# Patient Record
Sex: Female | Born: 1985 | Race: Black or African American | Hispanic: No | Marital: Married | Smoking: Never smoker
Health system: Southern US, Community
[De-identification: ages and names within clinical notes are randomized; demographics above are authoritative.]

## PROBLEM LIST (undated history)

## (undated) ENCOUNTER — Inpatient Hospital Stay (HOSPITAL_COMMUNITY): Payer: Self-pay

## (undated) DIAGNOSIS — K219 Gastro-esophageal reflux disease without esophagitis: Secondary | ICD-10-CM

## (undated) DIAGNOSIS — Z9889 Other specified postprocedural states: Secondary | ICD-10-CM

## (undated) DIAGNOSIS — R519 Headache, unspecified: Secondary | ICD-10-CM

## (undated) DIAGNOSIS — E119 Type 2 diabetes mellitus without complications: Secondary | ICD-10-CM

## (undated) DIAGNOSIS — Z87442 Personal history of urinary calculi: Secondary | ICD-10-CM

## (undated) DIAGNOSIS — R7303 Prediabetes: Secondary | ICD-10-CM

## (undated) DIAGNOSIS — R87629 Unspecified abnormal cytological findings in specimens from vagina: Secondary | ICD-10-CM

## (undated) DIAGNOSIS — J302 Other seasonal allergic rhinitis: Secondary | ICD-10-CM

## (undated) DIAGNOSIS — R112 Nausea with vomiting, unspecified: Secondary | ICD-10-CM

## (undated) DIAGNOSIS — I10 Essential (primary) hypertension: Secondary | ICD-10-CM

## (undated) HISTORY — DX: Unspecified abnormal cytological findings in specimens from vagina: R87.629

## (undated) HISTORY — PX: BILATERAL KNEE ARTHROSCOPY: SUR91

## (undated) HISTORY — DX: Prediabetes: R73.03

## (undated) HISTORY — PX: KNEE ARTHROSCOPY: SUR90

---

## 2004-05-02 HISTORY — PX: GYNECOLOGIC CRYOSURGERY: SHX857

## 2004-06-17 ENCOUNTER — Ambulatory Visit: Payer: Self-pay | Admitting: Obstetrics and Gynecology

## 2004-07-08 ENCOUNTER — Ambulatory Visit: Payer: Self-pay | Admitting: Obstetrics and Gynecology

## 2004-07-27 ENCOUNTER — Ambulatory Visit: Payer: Self-pay | Admitting: Obstetrics and Gynecology

## 2004-11-09 ENCOUNTER — Ambulatory Visit: Payer: Self-pay | Admitting: Obstetrics & Gynecology

## 2005-02-02 ENCOUNTER — Inpatient Hospital Stay (HOSPITAL_COMMUNITY): Admission: AD | Admit: 2005-02-02 | Discharge: 2005-02-02 | Payer: Self-pay | Admitting: Family Medicine

## 2005-02-17 ENCOUNTER — Inpatient Hospital Stay (HOSPITAL_COMMUNITY): Admission: AD | Admit: 2005-02-17 | Discharge: 2005-02-18 | Payer: Self-pay | Admitting: Obstetrics & Gynecology

## 2005-02-28 ENCOUNTER — Emergency Department (HOSPITAL_COMMUNITY): Admission: EM | Admit: 2005-02-28 | Discharge: 2005-02-28 | Payer: Self-pay | Admitting: Family Medicine

## 2005-03-15 ENCOUNTER — Ambulatory Visit: Payer: Self-pay | Admitting: Obstetrics & Gynecology

## 2005-03-15 ENCOUNTER — Encounter: Payer: Self-pay | Admitting: Obstetrics & Gynecology

## 2005-03-17 ENCOUNTER — Emergency Department (HOSPITAL_COMMUNITY): Admission: EM | Admit: 2005-03-17 | Discharge: 2005-03-17 | Payer: Self-pay | Admitting: Family Medicine

## 2005-03-23 ENCOUNTER — Inpatient Hospital Stay (HOSPITAL_COMMUNITY): Admission: AD | Admit: 2005-03-23 | Discharge: 2005-03-24 | Payer: Self-pay | Admitting: Obstetrics & Gynecology

## 2005-04-27 ENCOUNTER — Ambulatory Visit (HOSPITAL_COMMUNITY): Admission: RE | Admit: 2005-04-27 | Discharge: 2005-04-27 | Payer: Self-pay | Admitting: Obstetrics & Gynecology

## 2005-05-20 ENCOUNTER — Inpatient Hospital Stay (HOSPITAL_COMMUNITY): Admission: AD | Admit: 2005-05-20 | Discharge: 2005-05-20 | Payer: Self-pay | Admitting: Obstetrics & Gynecology

## 2005-05-21 ENCOUNTER — Inpatient Hospital Stay (HOSPITAL_COMMUNITY): Admission: AD | Admit: 2005-05-21 | Discharge: 2005-05-25 | Payer: Self-pay | Admitting: Obstetrics & Gynecology

## 2005-05-25 ENCOUNTER — Encounter (INDEPENDENT_AMBULATORY_CARE_PROVIDER_SITE_OTHER): Payer: Self-pay | Admitting: Specialist

## 2005-07-19 ENCOUNTER — Ambulatory Visit: Payer: Self-pay | Admitting: Neonatology

## 2005-07-19 ENCOUNTER — Inpatient Hospital Stay (HOSPITAL_COMMUNITY): Admission: AD | Admit: 2005-07-19 | Discharge: 2005-07-25 | Payer: Self-pay | Admitting: Obstetrics & Gynecology

## 2005-09-17 ENCOUNTER — Inpatient Hospital Stay (HOSPITAL_COMMUNITY): Admission: AD | Admit: 2005-09-17 | Discharge: 2005-09-20 | Payer: Self-pay | Admitting: Obstetrics

## 2007-04-24 ENCOUNTER — Emergency Department (HOSPITAL_COMMUNITY): Admission: EM | Admit: 2007-04-24 | Discharge: 2007-04-24 | Payer: Self-pay | Admitting: Emergency Medicine

## 2007-05-11 ENCOUNTER — Inpatient Hospital Stay (HOSPITAL_COMMUNITY): Admission: RE | Admit: 2007-05-11 | Discharge: 2007-05-11 | Payer: Self-pay | Admitting: Gynecology

## 2007-06-13 ENCOUNTER — Inpatient Hospital Stay (HOSPITAL_COMMUNITY): Admission: AD | Admit: 2007-06-13 | Discharge: 2007-06-13 | Payer: Self-pay | Admitting: Obstetrics and Gynecology

## 2007-08-16 ENCOUNTER — Ambulatory Visit (HOSPITAL_COMMUNITY): Admission: RE | Admit: 2007-08-16 | Discharge: 2007-08-16 | Payer: Self-pay | Admitting: Obstetrics & Gynecology

## 2007-08-24 ENCOUNTER — Ambulatory Visit (HOSPITAL_COMMUNITY): Admission: RE | Admit: 2007-08-24 | Discharge: 2007-08-24 | Payer: Self-pay | Admitting: Obstetrics & Gynecology

## 2007-10-03 ENCOUNTER — Ambulatory Visit (HOSPITAL_COMMUNITY): Admission: RE | Admit: 2007-10-03 | Discharge: 2007-10-03 | Payer: Self-pay | Admitting: Obstetrics & Gynecology

## 2007-10-04 ENCOUNTER — Inpatient Hospital Stay (HOSPITAL_COMMUNITY): Admission: AD | Admit: 2007-10-04 | Discharge: 2007-10-04 | Payer: Self-pay | Admitting: Obstetrics

## 2007-10-22 ENCOUNTER — Inpatient Hospital Stay (HOSPITAL_COMMUNITY): Admission: AD | Admit: 2007-10-22 | Discharge: 2007-10-25 | Payer: Self-pay | Admitting: Obstetrics & Gynecology

## 2007-11-25 ENCOUNTER — Inpatient Hospital Stay (HOSPITAL_COMMUNITY): Admission: AD | Admit: 2007-11-25 | Discharge: 2007-11-25 | Payer: Self-pay | Admitting: Obstetrics & Gynecology

## 2007-12-02 ENCOUNTER — Inpatient Hospital Stay (HOSPITAL_COMMUNITY): Admission: AD | Admit: 2007-12-02 | Discharge: 2007-12-04 | Payer: Self-pay | Admitting: Obstetrics

## 2008-06-10 ENCOUNTER — Emergency Department (HOSPITAL_COMMUNITY): Admission: EM | Admit: 2008-06-10 | Discharge: 2008-06-10 | Payer: Self-pay | Admitting: Family Medicine

## 2010-08-17 LAB — POCT RAPID STREP A (OFFICE): Streptococcus, Group A Screen (Direct): NEGATIVE

## 2010-09-14 NOTE — H&P (Signed)
NAME:  Sherri Castaneda, Sherri Castaneda               ACCOUNT NO.:  [id number]   MEDICAL RECORD NO.:  [id number]          PATIENT TYPE:  INP   LOCATION:  9156                          FACILITY:  [facility]   PHYSICIAN:  Roseanna Rainbow, M.D.DATE OF BIRTH:  April 04, 1986   DATE OF ADMISSION:  10/22/2007  DATE OF DISCHARGE:                              HISTORY & PHYSICAL   CHIEF COMPLAINT:  The patient is a 25 year old para 1, with estimated  date of confinement of December 12, 2007, with an intrauterine pregnancy  at 32.5 weeks with a preterm premature cervical changes.   HISTORY OF PRESENT ILLNESS:  Please see the above.  The patient has had  sporadic uterine contractions over the past several weeks.  Workup today  has included a positive fetal fibronectin.  The patient reports  increased pressure and inguinal discomfort.  She denies rupture of  membranes or vaginal bleeding.   ALLERGIES:  LATEX, METRONIDAZOLE.   MEDICATIONS:  Please see the reconciliation form.   PAST OBSTETRICAL HISTORY:  In May 2007, she has delivered a live born  female 5 pounds 12 ounces at 38 weeks vaginal delivery.   PRENATAL LABS:  Blood type is O+, antibody screen negative, Chlamydia  probe negative.  Urine culture and sensitivity October 02, 2007, no growth.  Fetal fibronectin positive on October 11, 2007.  GC probe negative.  Hepatitis B surface antigen negative.  Hematocrit 35.3, hemoglobin 11.4.  HIV nonreactive.  Platelets 222,000.  RPR nonreactive.  Rubella immune.  Sickle cell negative.  Varicella immune.  Ultrasound on October 03, 2007, no  previa, amniotic fluid normal, estimated fetal weight percentile 79th  percentile for 30 weeks 1 day.   PAST GYN HISTORY:  1. Cervical dysplasia.  2. Cryosurgery.   PAST MEDICAL HISTORY:  1. Constipation.  2. Recurrent ear infections.  3. Migraine headaches.  4. Tension headaches.  5. Human papilloma virus.   PAST SURGICAL HISTORY:  Please see the above.   SOCIAL HISTORY:  She is a  Scientist, physiological.  She is engaged, living with the  significant other.  Does not give any significant history of alcohol  usage, has no significant smoking history.  Denies illicit drug use.   FAMILY HISTORY:  Asthma, Hodgkin's lymphoma, liver cancer, breast  cancer, colon cancer, lung cancer, COPD, depression, diabetes mellitus,  hypertension, hypercholesterolemia, kidney stones, and migraine  headaches.   PHYSICAL EXAMINATION:  On physical exam; vital signs stable, afebrile.  Fetal heart rate by Doppler 140.  Sterile vaginal exam, the cervix is  loose 2 cm dilated, 60% effaced, vertex, and -3 station.   ASSESSMENT:  Intrauterine pregnancy at 32 plus weeks, threatened preterm  labor, preterm premature cervical changes.   PLAN:  Admission, bedrest, terbutaline subcutaneous p.r.n. for  tocolysis, betamethasone, repeat complete OB ultrasound for growth,  check urinalysis, urine culture and sensitivity, and clindamycin  parenterally.      Roseanna Rainbow, M.D.  Electronically Signed     LAJ/MEDQ  D:  10/22/2007  T:  10/23/2007  Job:  [id number]

## 2010-09-17 NOTE — Discharge Summary (Signed)
NAME:  Sherri Castaneda, Sherri Castaneda               ACCOUNT NO.:  [id number]   MEDICAL RECORD NO.:  [id number]          PATIENT TYPE:  INP   LOCATION:  9156                          [facility]   PHYSICIAN:  Roseanna Rainbow, M.D.DATE OF BIRTH:  04-06-1986   DATE OF ADMISSION:  07/19/2005  DATE OF DISCHARGE:  07/25/2005                                 DISCHARGE SUMMARY   CHIEF COMPLAINT:  The patient is a 25 year old gravida 1 with an estimated  date of confinement of October 07, 2005, with an intrauterine pregnancy at 28+  weeks, complaining of uterine contractions.  Please see the dictated history  and physical for further details.   HOSPITAL COURSES:  The patient was admitted and started on parenteral broad-  spectrum antibiotics.  She also received a course of steroids and several  doses of subcutaneous terbutaline.  An ultrasound on March 20 demonstrated  an estimated fetal weight percentile of 25th-50th percentile and normal  amniotic fluid in a dynamic cervix that measured 1.4 to 3 cm in length with  funneling at the internal os.  The patient remained without preterm labor.  She remained stable on bedrest.  No further tocolytics were needed after the  initial day.  A repeat ultrasound on March 24 was without change.   DISCHARGE DIAGNOSIS:  Intrauterine pregnancy at 29+ weeks with preterm  premature cervical changes.   CONDITION:  Stable.   DIET:  Regular.   ACTIVITY:  Pelvic rest and bedrest.   MEDICATIONS:  To resume home medications.   DISPOSITION:  The patient was to follow up in the office in 1 week.      Roseanna Rainbow, M.D.  Electronically Signed     LAJ/MEDQ  D:  07/25/2005  T:  07/26/2005  Job:  [id number]

## 2010-09-17 NOTE — H&P (Signed)
NAME:  Sherri Castaneda, Sherri Castaneda               ACCOUNT NO.:  [id number]   MEDICAL RECORD NO.:  [id number]          PATIENT TYPE:  INP   LOCATION:  9156                          [facility]ILITY[facility]   PHYSICIAN:  Roseanna Rainbow, M.D.DATE OF BIRTH:  07-Jan-1986   DATE OF ADMISSION:  07/19/2005  DATE OF DISCHARGE:                                HISTORY & PHYSICAL   CHIEF COMPLAINT:  The patient is a 25 year old gravida 1 with an estimated  date of confinement of October 07, 2005 with an intrauterine pregnancy at 28+  weeks, complaining of uterine contractions.   HISTORY OF PRESENT ILLNESS:  Please see the above.   ALLERGIES:  FLAGYL.   MEDICATIONS:  Prenatal vitamins.   CURRENT MEDICATIONS:  Prenatal vitamins.   PREGNANCY COMPLICATIONS/RISK FACTORS:  Urolithiasis.   LABORATORY DATA:  Blood type is O positive, antibody screen negative.  Hepatitis B surface antigen negative. Hematocrit 36.5, hemoglobin 11.8,  platelets 249,000. HIV nonreactive. Rubella immune Sickle cell negative. RPR  nonreactive. An ultrasound on April 27, 2005 revealed no previa, normal  amniotic fluid, normal anatomic survey.   PAST OB/GYN HISTORY:  She has a history of cervical dysplasia. She has had  cryosurgery.   PAST MEDICAL HISTORY:  Please see the above. She has had GERD, chronic  constipation, ear infections, headaches.   PAST SURGICAL HISTORY:  Please see the above.   SOCIAL HISTORY:  She works in Clinical biochemist at [employer]. She is single, living  with a significant other. Does not give any significant history of alcohol  usage. Has no significant smoking history. Denies illicit drug use.   FAMILY HISTORY:  Alzheimer's, Hodgkin's lymphoma, breast cancer, lung  cancer, liver cancer, CVA, hypercholesterolemia. hypertension, Parkinson's.   PHYSICAL EXAMINATION:  Weight 135.5 pounds. Blood pressure 129/69. Urine dip  negative. Non-stress test baseline 140's with mild variable decelerations,  long term variability  reassuring. Irregular uterine contractions.   STERILE VAGINAL EXAM:  Cervix is mid position, medium to soft consistency,  finger tip, approximately 50% effaced. Informal ultrasound - on vaginal  ultrasound the cervix appears dynamic.   ASSESSMENT:  Primigravida with an intrauterine pregnancy of 20+ weeks. Rule  out threatened preterm labor.   PLAN:  Admission, tocolysis as needed, betamethasone, broad spectrum  parenteral antibiotics and bed rest. Will repeat a complete OB ultrasound.      Roseanna Rainbow, M.D.  Electronically Signed     LAJ/MEDQ  D:  07/19/2005  T:  07/19/2005  Job:  [id number]

## 2010-09-17 NOTE — Discharge Summary (Signed)
NAME:  Sherri Castaneda, Sherri Castaneda               ACCOUNT NO.:  [id number]   MEDICAL RECORD NO.:  [id number]          PATIENT TYPE:  INP   LOCATION:  9310                          [facility]ILITY:  [facility]   PHYSICIAN:  Roseanna Rainbow, M.D.DATE OF BIRTH:  12-Oct-1985   DATE OF ADMISSION:  05/21/2005  DATE OF DISCHARGE:  05/25/2005                                 DISCHARGE SUMMARY   CHIEF COMPLAINT:  The patient is 25 year old, gravida 0, with an estimated  date of confinement of October 07, 2005 complaining of right flank pain.   HISTORY OF PRESENT ILLNESS:  The patient had presented to the office one day  prior with similar complaints and was further evaluated in the maternity  admission unit.   ALLERGIES:  Flagyl.   PHYSICAL EXAMINATION:  VITAL SIGNS:  Stable.  Afebrile.  GENERAL APPEARANCE:  Well-developed female in no distress.  ABDOMEN:  Minimal right lower quadrant pain.  BACK:  No costovertebral angle tenderness.   ASSESSMENT/PLAN:  Rule out nephro or urolithiasis.   PLAN:  Admission.  Bed rest.   HOSPITAL COURSE:  The patient was admitted and supportive care therapies  were administered.  Her pain improved.  She was given a morphine sulfate  patient-controlled analgesic pump.  She did pass a stone on January 22 and  this was sent for analysis.  She was subsequently discharged home on January  24.   DISCHARGE DIAGNOSES:  1.  Intrauterine pregnancy at 20+ weeks.  2.  Urolithiasis.   CONDITION ON DISCHARGE:  Stable.   DIET:  Regular.   ACTIVITY:  Ad lib.   MEDICATIONS:  Percocet.   DISPOSITION:  The patient was to follow up in the office in one week.      Roseanna Rainbow, M.D.  Electronically Signed     LAJ/MEDQ  D:  06/08/2005  T:  06/09/2005  Job:  [id number]

## 2010-09-17 NOTE — Group Therapy Note (Signed)
NAME:  Sherri Castaneda, Sherri Castaneda NO.:  [id number]   MEDICAL RECORD NO.:  [id number]          PATIENT TYPE:  WOC   LOCATION:  [facility]                   FACILITY:  [facility]   PHYSICIAN:  Argentina Donovan, MD        DATE OF BIRTH:  Dec 06, 1985   DATE OF SERVICE:  07/27/2004                                    CLINIC NOTE   CHIEF COMPLAINT:  Vaginal discharge with odor after cryosurgery.   HISTORY OF PRESENT ILLNESS:  This is a 25 year old black female status post  cryosurgery who presents after calling with complaints of a vaginal  discharge and odor. The patient states that the discharge actually stopped  on Friday but she was unable to call the clinic back to cancel her  appointment.  She described a previous discharge as being a watery brownish, sometimes  whitish discharge. She denies a fishy odor, just a foul odor which is  difficult for her to describe. She denies any other complaints. No itching,  burning, or pain.   PHYSICAL EXAMINATION:  The vagina appears clean. The cervix appears within  normal limits. There is scant blood in the vagina as the patient has just  finished her menstrual period. There is no evidence of a discharge. No odor  is noted on exam. The external genitalia appears normal.   ASSESSMENT/PLAN:  A 25 year old status post cryosurgery with resolved  vaginal discharge and odor.  1.  The patient is to call and return to clinic with any further complaints.  2.  The patient is to return for repeat Pap in 4 months.      PR/MEDQ  D:  07/27/2004  T:  07/28/2004  Job:  [id number]

## 2010-09-17 NOTE — Discharge Summary (Signed)
NAME:  Sherri Castaneda, Sherri Castaneda               ACCOUNT NO.:  [id number]   MEDICAL RECORD NO.:  [id number]          PATIENT TYPE:  INP   LOCATION:  9156                          [facility]ILITY[facility]   PHYSICIAN:  Roseanna Rainbow, M.D.DATE OF BIRTH:  10-Nov-1985   DATE OF ADMISSION:  10/22/2007  DATE OF DISCHARGE:  10/25/2007                               DISCHARGE SUMMARY   CHIEF COMPLAINT:  The patient is a 25 year old para 1 with an estimated  date of confinement of December 12, 2007, with an intrauterine pregnancy  at 32 plus weeks, with preterm premature cervical changes.  Please see  the dictated history and physical.   HOSPITAL COURSE:  The patient was admitted.  She was initially started  on Procardia for tocolysis.  However on the Procardia, she complained of  increasing uterine contractions.  The Procardia was discontinued and she  received a course of magnesium sulfate.  She was adequately tocolyzed  with magnesium sulfate.  This was subsequently discontinued.  An  ultrasound on October 24, 2007, demonstrated a closed cervix, but was  dynamic without funneling.  The cervical length was 2.7 cm.  The AFI was  13.  The estimated fetal weight was at the 59th percentile.  On October 25, 2007, the magnesium sulfate was discontinued.  She remained without  uterine contractions after therapy and she was discharged to home.   DISCHARGE DIAGNOSIS:  Threatened preterm labor.   CONDITION:  Stable.   DIET:  Regular.   ACTIVITY:  Modified bed rest, pelvic rest.   DISPOSITION:  The patient was to follow up in the office in 1 week.      Roseanna Rainbow, M.D.  Electronically Signed     LAJ/MEDQ  D:  11/09/2007  T:  11/10/2007  Job:  [id number]

## 2010-12-06 ENCOUNTER — Inpatient Hospital Stay (INDEPENDENT_AMBULATORY_CARE_PROVIDER_SITE_OTHER)
Admission: RE | Admit: 2010-12-06 | Discharge: 2010-12-06 | Disposition: A | Discharge status: Home / Self Care | Payer: Self-pay | Source: Ambulatory Visit | Attending: Family Medicine | Admitting: Family Medicine

## 2010-12-06 DIAGNOSIS — N760 Acute vaginitis: Secondary | ICD-10-CM

## 2010-12-06 LAB — POCT URINALYSIS DIP (DEVICE)
Glucose, UA: NEGATIVE mg/dL
Hgb urine dipstick: NEGATIVE
Leukocytes, UA: NEGATIVE
Nitrite: NEGATIVE
Protein, ur: NEGATIVE mg/dL
Specific Gravity, Urine: 1.02 (ref 1.005–1.030)
Urobilinogen, UA: 0.2 mg/dL (ref 0.0–1.0)
pH: 7.5 (ref 5.0–8.0)

## 2010-12-06 LAB — WET PREP, GENITAL
Trich, Wet Prep: NONE SEEN
Yeast Wet Prep HPF POC: NONE SEEN

## 2010-12-06 LAB — POCT PREGNANCY, URINE: Preg Test, Ur: NEGATIVE

## 2010-12-06 LAB — OB RESULTS CONSOLE GC/CHLAMYDIA: Chlamydia: NEGATIVE

## 2010-12-07 LAB — GC/CHLAMYDIA PROBE AMP, GENITAL: Chlamydia, DNA Probe: NEGATIVE

## 2011-01-20 LAB — URINALYSIS, ROUTINE W REFLEX MICROSCOPIC
Ketones, ur: NEGATIVE
Nitrite: NEGATIVE
Protein, ur: NEGATIVE

## 2011-01-20 LAB — WET PREP, GENITAL
Clue Cells Wet Prep HPF POC: NONE SEEN
Trich, Wet Prep: NONE SEEN

## 2011-01-21 LAB — URINALYSIS, ROUTINE W REFLEX MICROSCOPIC
Protein, ur: NEGATIVE
Urobilinogen, UA: 0.2

## 2011-01-27 LAB — URINALYSIS, ROUTINE W REFLEX MICROSCOPIC
Bilirubin Urine: NEGATIVE
Glucose, UA: NEGATIVE
Hgb urine dipstick: NEGATIVE
Ketones, ur: NEGATIVE
Ketones, ur: NEGATIVE
Nitrite: NEGATIVE
Nitrite: NEGATIVE
Protein, ur: NEGATIVE
pH: 7.5

## 2011-01-27 LAB — SAMPLE TO BLOOD BANK

## 2011-01-27 LAB — URINE CULTURE: Colony Count: NO GROWTH

## 2011-01-27 LAB — CBC
MCV: 79.2
RBC: 3.95
WBC: 10.5

## 2011-01-27 LAB — RPR: RPR Ser Ql: NONREACTIVE

## 2011-01-28 LAB — RPR: RPR Ser Ql: NONREACTIVE

## 2011-01-28 LAB — CBC
HCT: 32.1 — ABNORMAL LOW
Hemoglobin: 10.6 — ABNORMAL LOW
MCV: 77.9 — ABNORMAL LOW
RBC: 4.12
RBC: 4.46
WBC: 10.6 — ABNORMAL HIGH
WBC: 13.2 — ABNORMAL HIGH

## 2011-02-04 LAB — COMPREHENSIVE METABOLIC PANEL
AST: 16
BUN: 9
CO2: 23
Calcium: 9.7
Chloride: 101
Creatinine, Ser: 0.69
GFR calc Af Amer: >60
GFR calc non Af Amer: >60
Total Bilirubin: 0.5

## 2011-02-04 LAB — URINALYSIS, ROUTINE W REFLEX MICROSCOPIC
Glucose, UA: NEGATIVE
Hgb urine dipstick: NEGATIVE
Leukocytes, UA: NEGATIVE
Specific Gravity, Urine: 1.035 — ABNORMAL HIGH

## 2011-02-04 LAB — DIFFERENTIAL
Basophils Absolute: 0
Eosinophils Relative: 0
Lymphocytes Relative: 5 — ABNORMAL LOW
Lymphs Abs: 0.7
Neutro Abs: 12.2 — ABNORMAL HIGH

## 2011-02-04 LAB — URINE MICROSCOPIC-ADD ON

## 2011-02-04 LAB — CBC
HCT: 40.6
MCHC: 33.8
MCV: 76.2 — ABNORMAL LOW
Platelets: 328
RBC: 5.33 — ABNORMAL HIGH

## 2011-02-04 LAB — LIPASE, BLOOD: Lipase: 20

## 2011-05-03 NOTE — L&D Delivery Note (Signed)
Delivery Note At 12:22 PM a viable female was delivered via  (Presentation: ROA ).     Placenta status: delivered w/cord traction/intact/3VC/meconium-stained .    Anesthesia:  Epidural Episiotomy: none Lacerations: none Suture Repair: n/a Est. Blood Loss (mL): 200 ml  Mom to postpartum.  Baby to nursery-stable.  JACKSON-MOORE,Giang Hemme A 03/28/2012, 12:29 PM

## 2011-09-06 ENCOUNTER — Inpatient Hospital Stay (HOSPITAL_COMMUNITY)
Admission: AD | Admit: 2011-09-06 | Discharge: 2011-09-06 | Disposition: A | Discharge status: Home / Self Care | Payer: Medicaid Other | Source: Ambulatory Visit | Attending: Obstetrics & Gynecology | Admitting: Obstetrics & Gynecology

## 2011-09-06 ENCOUNTER — Encounter (HOSPITAL_COMMUNITY): Payer: Self-pay | Admitting: *Deleted

## 2011-09-06 DIAGNOSIS — O99891 Other specified diseases and conditions complicating pregnancy: Secondary | ICD-10-CM | POA: Insufficient documentation

## 2011-09-06 DIAGNOSIS — J309 Allergic rhinitis, unspecified: Secondary | ICD-10-CM | POA: Insufficient documentation

## 2011-09-06 DIAGNOSIS — J302 Other seasonal allergic rhinitis: Secondary | ICD-10-CM

## 2011-09-06 DIAGNOSIS — J069 Acute upper respiratory infection, unspecified: Secondary | ICD-10-CM | POA: Insufficient documentation

## 2011-09-06 HISTORY — DX: Other seasonal allergic rhinitis: J30.2

## 2011-09-06 MED ORDER — CETIRIZINE HCL 10 MG PO CAPS: 1.0000 | ORAL_CAPSULE | Freq: Every day | ORAL | Status: DC

## 2011-09-06 MED ORDER — ONDANSETRON HCL 4 MG PO TABS: 4.0000 mg | ORAL_TABLET | Freq: Three times a day (TID) | ORAL | Status: AC | PRN

## 2011-09-06 MED ORDER — DEXTROMETHORPHAN POLISTIREX 30 MG/5ML PO LQCR: 60.0000 mg | ORAL | Status: AC | PRN

## 2011-09-06 MED ORDER — GUAIFENESIN ER 600 MG PO TB12: 1200.0000 mg | ORAL_TABLET | Freq: Two times a day (BID) | ORAL | Status: DC

## 2011-09-06 NOTE — Discharge Instructions (Signed)
Allergic Rhinitis Allergic rhinitis is when the mucous membranes in the nose respond to allergens. Allergens are particles in the air that cause your body to have an allergic reaction. This causes you to release allergic antibodies. Through a chain of events, these eventually cause you to release histamine into the blood stream (hence the use of antihistamines). Although meant to be protective to the body, it is this release that causes your discomfort, such as frequent sneezing, congestion and an itchy runny nose.  CAUSES  The pollen allergens may come from grasses, trees, and weeds. This is seasonal allergic rhinitis, or "hay fever." Other allergens cause year-round allergic rhinitis (perennial allergic rhinitis) such as house dust mite allergen, pet dander and mold spores.  SYMPTOMS   Nasal stuffiness (congestion).   Runny, itchy nose with sneezing and tearing of the eyes.   There is often an itching of the mouth, eyes and ears.  It cannot be cured, but it can be controlled with medications. DIAGNOSIS  If you are unable to determine the offending allergen, skin or blood testing may find it. TREATMENT   Avoid the allergen.   Medications and allergy shots (immunotherapy) can help.   Hay fever may often be treated with antihistamines in pill or nasal spray forms. Antihistamines block the effects of histamine. There are over-the-counter medicines that may help with nasal congestion and swelling around the eyes. Check with your caregiver before taking or giving this medicine.  If the treatment above does not work, there are many new medications your caregiver can prescribe. Stronger medications may be used if initial measures are ineffective. Desensitizing injections can be used if medications and avoidance fails. Desensitization is when a patient is given ongoing shots until the body becomes less sensitive to the allergen. Make sure you follow up with your caregiver if problems continue. SEEK  MEDICAL CARE IF:   You develop fever (more than 100.5 F (38.1 C).   You develop a cough that does not stop easily (persistent).   You have shortness of breath.   You start wheezing.   Symptoms interfere with normal daily activities.  Document Released: 01/11/2001 Document Revised: 04/07/2011 Document Reviewed: 07/23/2008 Exit[employer] Patient Information 2012 [employer]  Upper Respiratory Infection, Adult An upper respiratory infection (URI) is also known as the common cold. It is often caused by a type of germ (virus). Colds are easily spread (contagious). You can pass it to others by kissing, coughing, sneezing, or drinking out of the same glass. Usually, you get better in 1 or 2 weeks.  HOME CARE   Only take medicine as told by your doctor.   Use a warm mist humidifier or breathe in steam from a hot shower.   Drink enough water and fluids to keep your pee (urine) clear or pale yellow.   Get plenty of rest.   Return to work when your temperature is back to normal or as told by your doctor. You may use a face mask and wash your hands to stop your cold from spreading.  GET HELP RIGHT AWAY IF:   After the first few days, you feel you are getting worse.   You have questions about your medicine.   You have chills, shortness of breath, or brown or red spit (mucus).   You have yellow or brown snot (nasal discharge) or pain in the face, especially when you bend forward.   You have a fever, puffy (swollen) neck, pain when you swallow, or white spots in the   back of your throat.   You have a bad headache, ear pain, sinus pain, or chest pain.   You have a high-pitched whistling sound when you breathe in and out (wheezing).   You have a lasting cough or cough up blood.   You have sore muscles or a stiff neck.  MAKE SURE YOU:   Understand these instructions.   Will watch your condition.   Will get help right away if you are not doing well or get worse.  Document Released:  10/05/2007 Document Revised: 04/07/2011 Document Reviewed: 08/23/2010 Exit[employer] Patient Information 2012 [employer]. 

## 2011-09-06 NOTE — MAU Note (Signed)
Throat was hurting yesterday morning.  Yesterday when got home from  Work.  Head was hurting,  Kept going hot then cold.  Felt feverishl. Blew nose this morning - green mucous, ears were hurting.  Couldn't wear glasses.  Everything from the throat/neck up hurts.  No appetite today, just drinking water to stay hydrated. Also pain in LLQ started yesterday

## 2011-09-06 NOTE — MAU Provider Note (Signed)
Chief Complaint:  Facial Pain    First Provider Initiated Contact with Patient 09/06/11 0900      Sherri Castaneda is  26 y.o. [id number].  No LMP recorded. Patient is pregnant..  [redacted]w[redacted]d  She presents complaining of Facial Pain . Onset is described as ongoing and has been present for  2-3 days. Describes sinus pressure, non productive cough, sore throat and right ear pain. Denies fever, abd pain, bleeding or LOF. Occasional chills, episodic vomiting and lower abd cramping after coughing  Obstetrical/Gynecological History: OB History    Grav Para Term Preterm Abortions TAB SAB Ect Mult Living   3 2 2       2       Past Medical History: Past Medical History  Diagnosis Date  . Seasonal allergies     Past Surgical History: Past Surgical History  Procedure Date  . Gynecologic cryosurgery     Family History: History reviewed. No pertinent family history.  Social History: History  Substance Use Topics  . Smoking status: Never Smoker   . Smokeless tobacco: Not on file  . Alcohol Use: No    Allergies: Allergies not on file  No prescriptions prior to admission    Review of Systems - ENT ROS: positive for - headaches, nasal congestion, nasal discharge, sinus pain and sore throat Allergy and Immunology ROS: positive for - itchy/watery eyes, nasal congestion, postnasal drip and seasonal allergies Respiratory ROS: positive for - cough Cardiovascular ROS: no chest pain or dyspnea on exertion Gastrointestinal ROS: no abdominal pain, change in bowel habits, or black or bloody stools  Physical Exam   Blood pressure 116/76, pulse 112, temperature 98.8 F (37.1 C), temperature source Oral, resp. rate 20, height 5' 1.5" (1.562 m), weight 136 lb (61.689 kg), SpO2 100.00%.  General: General appearance - alert, well appearing, and in no distress and oriented to person, place, and time Mental status - alert, oriented to person, place, and time, normal mood, behavior, speech, dress, motor  activity, and thought processes, affect appropriate to mood Eyes - allergy eyes Ears - bilateral TM's and external ear canals normal Nose - mucosal congestion, mucosal erythema, clear rhinorrhea and sinuses normal and nontender Mouth - mucous membranes moist, pharynx normal without lesions Neck - supple, no significant adenopathy Lymphatics - no palpable lymphadenopathy Chest - clear to auscultation, no wheezes, rales or rhonchi, symmetric air entry Heart - normal rate, regular rhythm, normal S1, S2, no murmurs, rubs, clicks or gallops Abdomen - soft, nontender, nondistended, no masses or organomegaly Focused Gynecological Exam: examination not indicated, FHTs dopplered at 150s  Assessment: 1. URI (upper respiratory infection)   2. Allergic rhinitis, seasonal     Plan: Discharge home OTC med given FU with [facility] on Thursday as scheduled  Sherri Castaneda E. 09/06/2011,9:20 AM

## 2011-09-08 LAB — OB RESULTS CONSOLE RUBELLA ANTIBODY, IGM: Rubella: IMMUNE

## 2011-09-08 LAB — OB RESULTS CONSOLE ANTIBODY SCREEN: Antibody Screen: NEGATIVE

## 2011-09-08 LAB — OB RESULTS CONSOLE ABO/RH

## 2011-09-09 ENCOUNTER — Other Ambulatory Visit: Payer: Self-pay | Admitting: Nurse Practitioner

## 2011-09-09 DIAGNOSIS — O3680X0 Pregnancy with inconclusive fetal viability, not applicable or unspecified: Secondary | ICD-10-CM

## 2011-09-12 ENCOUNTER — Ambulatory Visit (HOSPITAL_COMMUNITY)
Admission: RE | Admit: 2011-09-12 | Discharge: 2011-09-12 | Disposition: A | Discharge status: Home / Self Care | Payer: Medicaid Other | Source: Ambulatory Visit | Attending: Nurse Practitioner | Admitting: Nurse Practitioner

## 2011-09-12 DIAGNOSIS — R109 Unspecified abdominal pain: Secondary | ICD-10-CM | POA: Insufficient documentation

## 2011-09-12 DIAGNOSIS — O3680X0 Pregnancy with inconclusive fetal viability, not applicable or unspecified: Secondary | ICD-10-CM

## 2011-09-12 DIAGNOSIS — O99891 Other specified diseases and conditions complicating pregnancy: Secondary | ICD-10-CM | POA: Insufficient documentation

## 2011-09-12 DIAGNOSIS — Z3689 Encounter for other specified antenatal screening: Secondary | ICD-10-CM | POA: Insufficient documentation

## 2011-10-20 ENCOUNTER — Other Ambulatory Visit: Payer: Self-pay | Admitting: Physician Assistant

## 2011-12-06 LAB — OB RESULTS CONSOLE GC/CHLAMYDIA: Chlamydia: NEGATIVE

## 2011-12-21 LAB — OB RESULTS CONSOLE RPR: RPR: NONREACTIVE

## 2012-03-04 ENCOUNTER — Inpatient Hospital Stay (HOSPITAL_COMMUNITY)
Admission: AD | Admit: 2012-03-04 | Discharge: 2012-03-04 | Disposition: A | Discharge status: Home / Self Care | Payer: Medicaid Other | Source: Ambulatory Visit | Attending: Obstetrics & Gynecology | Admitting: Obstetrics & Gynecology

## 2012-03-04 ENCOUNTER — Encounter (HOSPITAL_COMMUNITY): Payer: Self-pay | Admitting: Obstetrics and Gynecology

## 2012-03-04 DIAGNOSIS — O4700 False labor before 37 completed weeks of gestation, unspecified trimester: Secondary | ICD-10-CM | POA: Insufficient documentation

## 2012-03-04 NOTE — MAU Note (Signed)
Pt presents to MAU with chief complaint of contractions. Pt says the contractions got strong this morning around 1030. Pt is a G3P2 at [redacted]w[redacted]d.

## 2012-03-04 NOTE — Progress Notes (Signed)
Notified Dr. Gaynell Face of cervical exam, contraction pattern, variables lasting 20-30 secs. Pt resting through contractions. Orders received to send patient home with labor precautions.

## 2012-03-05 LAB — OB RESULTS CONSOLE GBS: GBS: POSITIVE

## 2012-03-13 ENCOUNTER — Inpatient Hospital Stay (HOSPITAL_COMMUNITY)
Admission: AD | Admit: 2012-03-13 | Discharge: 2012-03-13 | Disposition: A | Discharge status: Home / Self Care | Payer: Medicaid Other | Source: Ambulatory Visit | Attending: Obstetrics | Admitting: Obstetrics

## 2012-03-13 DIAGNOSIS — O99891 Other specified diseases and conditions complicating pregnancy: Secondary | ICD-10-CM | POA: Insufficient documentation

## 2012-03-13 NOTE — MAU Note (Signed)
C/o leaking fluid from her vagina for 6 days

## 2012-03-18 ENCOUNTER — Inpatient Hospital Stay (HOSPITAL_COMMUNITY)
Admission: AD | Admit: 2012-03-18 | Discharge: 2012-03-18 | Disposition: A | Discharge status: Home / Self Care | Payer: Medicaid Other | Source: Ambulatory Visit | Attending: Obstetrics & Gynecology | Admitting: Obstetrics & Gynecology

## 2012-03-18 ENCOUNTER — Encounter (HOSPITAL_COMMUNITY): Payer: Self-pay | Admitting: Obstetrics and Gynecology

## 2012-03-18 DIAGNOSIS — O479 False labor, unspecified: Secondary | ICD-10-CM | POA: Insufficient documentation

## 2012-03-18 DIAGNOSIS — O368190 Decreased fetal movements, unspecified trimester, not applicable or unspecified: Secondary | ICD-10-CM | POA: Insufficient documentation

## 2012-03-18 MED ORDER — CYCLOBENZAPRINE HCL 10 MG PO TABS
10.0000 mg | ORAL_TABLET | Freq: Once | ORAL | Status: AC
  Administered 2012-03-18: 10 mg via ORAL
  Filled 2012-03-18: qty 1

## 2012-03-18 NOTE — MAU Note (Signed)
Pt reports having ctx since last night. Pain worse the last 2 hours. Reports leaking a small amount of clear fluid. Good fetal movement

## 2012-03-18 NOTE — MAU Note (Signed)
Patient requests flexeril. Dr. Clearance Coots called and orders received.

## 2012-03-18 NOTE — MAU Provider Note (Signed)
  History     CSN: [id number]  Arrival date and time: 03/18/12 1418   First Provider Initiated Contact with Patient 03/18/12 1446      Chief Complaint  Patient presents with  . Labor Eval   HPI Sherri Castaneda is a 26 y.o. [id number] at 101w2d. She presents with contractions since last evening and ? leaking fluid. Decreased fetal movement this am, great movement since arrived at hospital. No bleeding. Is just tired of hurting.      Past Medical History  Diagnosis Date  . Seasonal allergies     Past Surgical History  Procedure Date  . Gynecologic cryosurgery     History reviewed. No pertinent family history.  History  Substance Use Topics  . Smoking status: Never Smoker   . Smokeless tobacco: Not on file  . Alcohol Use: No    Allergies:  Allergies  Allergen Reactions  . Flagyl (Metronidazole) Swelling  . Pineapple Itching and Swelling  . Latex Itching and Rash    Prescriptions prior to admission  Medication Sig Dispense Refill  . pantoprazole (PROTONIX) 40 MG tablet Take 40 mg by mouth daily.      . Prenatal Vit-Fe Fumarate-FA (PRENATAL MULTIVITAMIN) TABS Take 1 tablet by mouth daily.        Review of Systems  Constitutional: Negative for fever and chills.  Gastrointestinal: Negative for nausea, vomiting, diarrhea and constipation.  Genitourinary: Positive for dysuria. Negative for urgency and frequency.  Musculoskeletal: Positive for back pain.   Physical Exam   Blood pressure 118/71, pulse 118, temperature 97.8 F (36.6 C), temperature source Oral, resp. rate 20.  Physical Exam  Constitutional: She is oriented to person, place, and time. She appears well-developed and well-nourished.  GI: Soft.  Genitourinary:       Pelvic: Vulva- nl anatomy, skin intact Vagina- mod amt thick creamy discharge Cx- 1 cm, 60 % per RN exam  Musculoskeletal: Normal range of motion.  Neurological: She is alert and oriented to person, place, and time.  Skin: Skin is warm  and dry.  Psychiatric: She has a normal mood and affect. Her behavior is normal.    MAU Course  Procedures Fern-Negative   Assessment and Plan  Fern negative, reactive strip RN to assess labor status  Montel Vanderhoof M. 03/18/2012, 2:58 PM

## 2012-03-18 NOTE — Progress Notes (Signed)
Dr. Clearance Coots notified of cervical exam- rule out rupture done by NP. Cervix is 1, 50, -3. Contractions irregular. Orders received for patient to be discharged home.

## 2012-03-28 ENCOUNTER — Inpatient Hospital Stay (HOSPITAL_COMMUNITY): Payer: Medicaid Other | Admitting: Anesthesiology

## 2012-03-28 ENCOUNTER — Encounter (HOSPITAL_COMMUNITY): Payer: Self-pay | Admitting: Anesthesiology

## 2012-03-28 ENCOUNTER — Encounter (HOSPITAL_COMMUNITY): Payer: Self-pay | Admitting: *Deleted

## 2012-03-28 ENCOUNTER — Inpatient Hospital Stay (HOSPITAL_COMMUNITY)
Admission: AD | Admit: 2012-03-28 | Discharge: 2012-03-30 | DRG: 775 | Disposition: A | Discharge status: Home / Self Care | Payer: Medicaid Other | Source: Ambulatory Visit | Attending: Obstetrics | Admitting: Obstetrics

## 2012-03-28 DIAGNOSIS — Z22330 Carrier of Group B streptococcus: Secondary | ICD-10-CM

## 2012-03-28 DIAGNOSIS — O99892 Other specified diseases and conditions complicating childbirth: Principal | ICD-10-CM | POA: Diagnosis present

## 2012-03-28 LAB — CBC
HCT: 34.2 % — ABNORMAL LOW (ref 36.0–46.0)
Hemoglobin: 10.7 g/dL — ABNORMAL LOW (ref 12.0–15.0)
MCHC: 31.3 g/dL (ref 30.0–36.0)
RDW: 15.3 % (ref 11.5–15.5)
WBC: 11 10*3/uL — ABNORMAL HIGH (ref 4.0–10.5)

## 2012-03-28 LAB — POCT FERN TEST: POCT Fern Test: NEGATIVE

## 2012-03-28 LAB — RPR: RPR Ser Ql: NONREACTIVE

## 2012-03-28 MED ORDER — LACTATED RINGERS IV SOLN
500.0000 mL | INTRAVENOUS | Status: DC | PRN
  Administered 2012-03-28 (×2): 500 mL via INTRAVENOUS

## 2012-03-28 MED ORDER — FERROUS SULFATE 325 (65 FE) MG PO TABS
325.0000 mg | ORAL_TABLET | Freq: Two times a day (BID) | ORAL | Status: DC
  Administered 2012-03-28 – 2012-03-30 (×4): 325 mg via ORAL
  Filled 2012-03-28 (×4): qty 1

## 2012-03-28 MED ORDER — ACETAMINOPHEN 325 MG PO TABS: 650.0000 mg | ORAL_TABLET | ORAL | Status: DC | PRN

## 2012-03-28 MED ORDER — PROMETHAZINE HCL 25 MG/ML IJ SOLN
12.5000 mg | Freq: Once | INTRAMUSCULAR | Status: AC
  Administered 2012-03-28: 12.5 mg via INTRAVENOUS
  Filled 2012-03-28: qty 1

## 2012-03-28 MED ORDER — DIPHENHYDRAMINE HCL 50 MG/ML IJ SOLN: 12.5000 mg | INTRAMUSCULAR | Status: DC | PRN

## 2012-03-28 MED ORDER — FENTANYL 2.5 MCG/ML BUPIVACAINE 1/10 % EPIDURAL INFUSION (WH - ANES)
14.0000 mL/h | INTRAMUSCULAR | Status: DC
Start: 2012-03-28 — End: 2012-03-28
  Filled 2012-03-28: qty 125

## 2012-03-28 MED ORDER — PRENATAL MULTIVITAMIN CH
1.0000 | ORAL_TABLET | Freq: Every day | ORAL | Status: DC
  Administered 2012-03-29 – 2012-03-30 (×2): 1 via ORAL
  Filled 2012-03-28 (×2): qty 1

## 2012-03-28 MED ORDER — DIBUCAINE 1 % RE OINT: 1.0000 "application " | TOPICAL_OINTMENT | RECTAL | Status: DC | PRN

## 2012-03-28 MED ORDER — BENZOCAINE-MENTHOL 20-0.5 % EX AERO
1.0000 "application " | INHALATION_SPRAY | CUTANEOUS | Status: DC | PRN
  Administered 2012-03-28: 1 via TOPICAL
  Filled 2012-03-28: qty 56

## 2012-03-28 MED ORDER — LANOLIN HYDROUS EX OINT: TOPICAL_OINTMENT | CUTANEOUS | Status: DC | PRN

## 2012-03-28 MED ORDER — PHENYLEPHRINE 40 MCG/ML (10ML) SYRINGE FOR IV PUSH (FOR BLOOD PRESSURE SUPPORT)
80.0000 ug | PREFILLED_SYRINGE | INTRAVENOUS | Status: DC | PRN
  Filled 2012-03-28: qty 2

## 2012-03-28 MED ORDER — OXYTOCIN 40 UNITS IN LACTATED RINGERS INFUSION - SIMPLE MED
62.5000 mL/h | INTRAVENOUS | Status: DC
  Filled 2012-03-28: qty 1000

## 2012-03-28 MED ORDER — OXYCODONE-ACETAMINOPHEN 5-325 MG PO TABS
1.0000 | ORAL_TABLET | ORAL | Status: DC | PRN
  Filled 2012-03-28: qty 1

## 2012-03-28 MED ORDER — WITCH HAZEL-GLYCERIN EX PADS: 1.0000 "application " | MEDICATED_PAD | CUTANEOUS | Status: DC | PRN

## 2012-03-28 MED ORDER — LACTATED RINGERS IV SOLN: 500.0000 mL | Freq: Once | INTRAVENOUS | Status: DC

## 2012-03-28 MED ORDER — DIPHENHYDRAMINE HCL 25 MG PO CAPS: 25.0000 mg | ORAL_CAPSULE | Freq: Four times a day (QID) | ORAL | Status: DC | PRN

## 2012-03-28 MED ORDER — LACTATED RINGERS IV SOLN
INTRAVENOUS | Status: DC
  Administered 2012-03-28 (×2): via INTRAVENOUS

## 2012-03-28 MED ORDER — ONDANSETRON HCL 4 MG PO TABS: 4.0000 mg | ORAL_TABLET | ORAL | Status: DC | PRN

## 2012-03-28 MED ORDER — EPHEDRINE 5 MG/ML INJ
10.0000 mg | INTRAVENOUS | Status: DC | PRN
  Filled 2012-03-28: qty 4
  Filled 2012-03-28: qty 2

## 2012-03-28 MED ORDER — ONDANSETRON HCL 4 MG/2ML IJ SOLN: 4.0000 mg | INTRAMUSCULAR | Status: DC | PRN

## 2012-03-28 MED ORDER — ZOLPIDEM TARTRATE 5 MG PO TABS: 5.0000 mg | ORAL_TABLET | Freq: Every evening | ORAL | Status: DC | PRN

## 2012-03-28 MED ORDER — OXYTOCIN BOLUS FROM INFUSION: 500.0000 mL | INTRAVENOUS | Status: DC

## 2012-03-28 MED ORDER — BUTORPHANOL TARTRATE 2 MG/ML IJ SOLN
2.0000 mg | Freq: Once | INTRAMUSCULAR | Status: DC
  Filled 2012-03-28: qty 2

## 2012-03-28 MED ORDER — IBUPROFEN 600 MG PO TABS: 600.0000 mg | ORAL_TABLET | Freq: Four times a day (QID) | ORAL | Status: DC | PRN

## 2012-03-28 MED ORDER — SENNOSIDES-DOCUSATE SODIUM 8.6-50 MG PO TABS
2.0000 | ORAL_TABLET | Freq: Every day | ORAL | Status: DC
  Administered 2012-03-28 – 2012-03-29 (×2): 2 via ORAL

## 2012-03-28 MED ORDER — ONDANSETRON HCL 4 MG/2ML IJ SOLN: 4.0000 mg | Freq: Four times a day (QID) | INTRAMUSCULAR | Status: DC | PRN

## 2012-03-28 MED ORDER — MEASLES, MUMPS & RUBELLA VAC ~~LOC~~ INJ
0.5000 mL | INJECTION | Freq: Once | SUBCUTANEOUS | Status: DC
  Filled 2012-03-28: qty 0.5

## 2012-03-28 MED ORDER — EPHEDRINE 5 MG/ML INJ
10.0000 mg | INTRAVENOUS | Status: DC | PRN
  Filled 2012-03-28: qty 2

## 2012-03-28 MED ORDER — PHENYLEPHRINE 40 MCG/ML (10ML) SYRINGE FOR IV PUSH (FOR BLOOD PRESSURE SUPPORT)
80.0000 ug | PREFILLED_SYRINGE | INTRAVENOUS | Status: DC | PRN
  Filled 2012-03-28: qty 2
  Filled 2012-03-28: qty 5

## 2012-03-28 MED ORDER — BUTORPHANOL TARTRATE 1 MG/ML IJ SOLN
2.0000 mg | Freq: Once | INTRAMUSCULAR | Status: AC
  Administered 2012-03-28: 2 mg via INTRAVENOUS

## 2012-03-28 MED ORDER — MAGNESIUM HYDROXIDE 400 MG/5ML PO SUSP: 30.0000 mL | ORAL | Status: DC | PRN

## 2012-03-28 MED ORDER — SODIUM CHLORIDE 0.9 % IV SOLN
2.0000 g | Freq: Once | INTRAVENOUS | Status: AC
  Administered 2012-03-28: 2 g via INTRAVENOUS
  Filled 2012-03-28: qty 2000

## 2012-03-28 MED ORDER — LIDOCAINE HCL (PF) 1 % IJ SOLN
INTRAMUSCULAR | Status: DC | PRN
  Administered 2012-03-28 (×2): 4 mL

## 2012-03-28 MED ORDER — CITRIC ACID-SODIUM CITRATE 334-500 MG/5ML PO SOLN: 30.0000 mL | ORAL | Status: DC | PRN

## 2012-03-28 MED ORDER — FENTANYL 2.5 MCG/ML BUPIVACAINE 1/10 % EPIDURAL INFUSION (WH - ANES)
INTRAMUSCULAR | Status: DC | PRN
  Administered 2012-03-28: 14 mL/h via EPIDURAL

## 2012-03-28 MED ORDER — IBUPROFEN 600 MG PO TABS
600.0000 mg | ORAL_TABLET | Freq: Four times a day (QID) | ORAL | Status: DC
  Administered 2012-03-28 – 2012-03-30 (×8): 600 mg via ORAL
  Filled 2012-03-28 (×8): qty 1

## 2012-03-28 MED ORDER — TETANUS-DIPHTH-ACELL PERTUSSIS 5-2.5-18.5 LF-MCG/0.5 IM SUSP
0.5000 mL | Freq: Once | INTRAMUSCULAR | Status: AC
  Administered 2012-03-30: 0.5 mL via INTRAMUSCULAR
  Filled 2012-03-28: qty 0.5

## 2012-03-28 MED ORDER — OXYCODONE-ACETAMINOPHEN 5-325 MG PO TABS: 1.0000 | ORAL_TABLET | ORAL | Status: DC | PRN

## 2012-03-28 MED ORDER — LIDOCAINE HCL (PF) 1 % IJ SOLN
30.0000 mL | INTRAMUSCULAR | Status: DC | PRN
  Filled 2012-03-28 (×2): qty 30

## 2012-03-28 NOTE — Anesthesia Preprocedure Evaluation (Signed)

## 2012-03-28 NOTE — MAU Note (Signed)
contractions 

## 2012-03-28 NOTE — H&P (Signed)
Sherri Castaneda is a 26 y.o. female presenting for UC's. Maternal Medical History:  Reason for admission: Reason for admission: contractions.  26 yo G3 P0200.  EDC 03-26-12.  Presents with UC's.   Contractions: Onset was 3-5 hours ago.   Frequency: regular.    Fetal activity: Perceived fetal activity is normal.   Last perceived fetal movement was within the past hour.    Prenatal complications: no prenatal complications Prenatal Complications - Diabetes: none.    OB History    Grav Para Term Preterm Abortions TAB SAB Ect Mult Living   3 2  2      2      Past Medical History  Diagnosis Date  . Seasonal allergies    Past Surgical History  Procedure Date  . Gynecologic cryosurgery    Family History: family history is not on file. Social History:  reports that she has never smoked. She does not have any smokeless tobacco history on file. She reports that she does not drink alcohol or use illicit drugs.   Prenatal Transfer Tool  Maternal Diabetes: No Genetic Screening: Normal Maternal Ultrasounds/Referrals: Normal Fetal Ultrasounds or other Referrals:  None Maternal Substance Abuse:  No Significant Maternal Medications:  Meds include: Other: PNV Significant Maternal Lab Results:  Lab values include: Group B Strep positive Other Comments:  None  Review of Systems  All other systems reviewed and are negative.    Dilation: 3 Effacement (%): 80 Station: -1 Exam by:: Weston,RN Temperature 98.6 F (37 C), temperature source Oral, resp. rate 22, height 5' (1.524 m), weight 170 lb (77.111 kg). Maternal Exam:  Uterine Assessment: Contraction strength is moderate.  Abdomen: Patient reports no abdominal tenderness. Fetal presentation: vertex  Pelvis: adequate for delivery.   Cervix: Cervix evaluated by digital exam.     Physical Exam  Constitutional: She is oriented to person, place, and time. She appears well-developed and well-nourished.  HENT:  Head: Normocephalic and  atraumatic.  Eyes: Conjunctivae normal are normal. Pupils are equal, round, and reactive to light.  Neck: Normal range of motion. Neck supple.  Cardiovascular: Normal rate.   Respiratory: Effort normal.  GI: Soft.  Genitourinary: Vagina normal and uterus normal.  Musculoskeletal: Normal range of motion.  Neurological: She is alert and oriented to person, place, and time.  Skin: Skin is warm and dry.  Psychiatric: She has a normal mood and affect. Her behavior is normal. Judgment and thought content normal.    Prenatal labs: ABO, Rh: O/--/-- (05/09 0000) Antibody: Negative (05/09 0000) Rubella: Immune (05/09 0000) RPR:    HBsAg: Negative (05/09 0000)  HIV: Non-reactive (05/09 0000)  GBS: Positive (11/04 0000)   Assessment/Plan: 39 weeks.  Early labor.  Expectant.   Batina Dougan A 03/28/2012, 7:48 AM

## 2012-03-28 NOTE — Anesthesia Postprocedure Evaluation (Signed)
  Anesthesia Post-op Note  Patient: Sherri Castaneda  Procedure(s) Performed: * No procedures listed *  Patient Location: Mother/Baby  Anesthesia Type:Spinal  Level of Consciousness: awake, alert  and oriented  Airway and Oxygen Therapy: Patient Spontanous Breathing  Post-op Pain: none  Post-op Assessment: Post-op Vital signs reviewed and Patient's Cardiovascular Status Stable  Post-op Vital Signs: Reviewed and stable  Complications: No apparent anesthesia complications

## 2012-03-28 NOTE — Anesthesia Procedure Notes (Signed)
Epidural Patient location during procedure: OB Start time: 03/28/2012 8:20 AM  Staffing Anesthesiologist: Jamisen Duerson A. Performed by: anesthesiologist   Preanesthetic Checklist Completed: patient identified, site marked, surgical consent, pre-op evaluation, timeout performed, IV checked, risks and benefits discussed and monitors and equipment checked  Epidural Patient position: sitting Prep: site prepped and draped and DuraPrep Patient monitoring: continuous pulse ox and blood pressure Approach: midline Injection technique: LOR air  Needle:  Needle type: Tuohy  Needle gauge: 17 G Needle length: 9 cm and 9 Needle insertion depth: 4 cm Catheter type: closed end flexible Catheter size: 19 Gauge Catheter at skin depth: 9 cm Test dose: negative and Other  Assessment Events: blood not aspirated, injection not painful, no injection resistance, negative IV test and no paresthesia  Additional Notes Patient identified. Risks and benefits discussed including failed block, incomplete  Pain control, post dural puncture headache, nerve damage, paralysis, blood pressure Changes, nausea, vomiting, reactions to medications-both toxic and allergic and post Partum back pain. All questions were answered. Patient expressed understanding and wished to proceed. Sterile technique was used throughout procedure. Epidural site was Dressed with sterile barrier dressing. No paresthesias, signs of intravascular injection Or signs of intrathecal spread were encountered.  Patient was more comfortable after the epidural was dosed. Please see RN's note for documentation of vital signs and FHR which are stable.

## 2012-03-28 NOTE — Progress Notes (Signed)
UR chart review completed.  

## 2012-03-29 MED ORDER — INFLUENZA VIRUS VACC SPLIT PF IM SUSP: 0.5000 mL | INTRAMUSCULAR | Status: DC

## 2012-03-29 MED ORDER — INFLUENZA VIRUS VACC SPLIT PF IM SUSP: 0.5000 mL | Freq: Once | INTRAMUSCULAR | Status: DC

## 2012-03-29 NOTE — Progress Notes (Signed)
Post Partum Day 1 Subjective: no complaints  Objective: Blood pressure 113/72, pulse 85, temperature 98 F (36.7 C), temperature source Oral, resp. rate 18, height 5' (1.524 m), weight 170 lb (77.111 kg), SpO2 99.00%, unknown if currently breastfeeding.  Physical Exam:  General: alert and no distress Lochia: appropriate Uterine Fundus: firm Incision: n/a DVT Evaluation: No evidence of DVT seen on physical exam.   Basename 03/28/12 0705  HGB 10.7*  HCT 34.2*    Assessment/Plan: Plan for discharge tomorrow   LOS: 1 day   Sherri Castaneda A 03/29/2012, 10:53 AM

## 2012-03-30 MED ORDER — OXYCODONE-ACETAMINOPHEN 5-325 MG PO TABS: 1.0000 | ORAL_TABLET | ORAL | Status: DC | PRN

## 2012-03-30 MED ORDER — IBUPROFEN 600 MG PO TABS: 600.0000 mg | ORAL_TABLET | Freq: Four times a day (QID) | ORAL | Status: DC | PRN

## 2012-03-30 NOTE — Progress Notes (Signed)
Post Partum Day 2 Subjective: no complaints  Objective: Blood pressure 127/79, pulse 86, temperature 97.9 F (36.6 C), temperature source Oral, resp. rate 16, height 5' (1.524 m), weight 170 lb (77.111 kg), SpO2 99.00%, unknown if currently breastfeeding.  Physical Exam:  General: alert and no distress Lochia: appropriate Uterine Fundus: firm Incision: none DVT Evaluation: No evidence of DVT seen on physical exam.   Basename 03/28/12 0705  HGB 10.7*  HCT 34.2*    Assessment/Plan: Discharge home   LOS: 2 days   Francine Hannan A 03/30/2012, 9:07 AM

## 2012-03-30 NOTE — Discharge Summary (Signed)
Obstetric Discharge Summary Reason for Admission: onset of labor Prenatal Procedures: ultrasound Intrapartum Procedures: spontaneous vaginal delivery Postpartum Procedures: none Complications-Operative and Postpartum: none Hemoglobin  Date Value Range Status  03/28/2012 10.7* 12.0 - 15.0 g/dL Final     HCT  Date Value Range Status  03/28/2012 34.2* 36.0 - 46.0 % Final    Physical Exam:  General: alert and no distress Lochia: appropriate Uterine Fundus: firm Incision: none DVT Evaluation: No evidence of DVT seen on physical exam.  Discharge Diagnoses: Term Pregnancy-delivered  Discharge Information: Date: 03/30/2012 Activity: pelvic rest Diet: routine Medications: PNV, Ibuprofen, Colace and Percocet Condition: stable Instructions: refer to practice specific booklet Discharge to: home Follow-up Information    Follow up with Antionette Char A, MD. Schedule an appointment as soon as possible for Castaneda visit in 6 weeks.   Contact information:   [address] [place] [address] [phone]          Newborn Data: Live born female  Birth Weight: 8 lb 0.2 oz (3634 g) APGAR: 8, 9  Home with mother.  Sherri Castaneda,Sherri Castaneda 03/30/2012, 11:15 AM

## 2012-10-07 ENCOUNTER — Encounter (HOSPITAL_COMMUNITY): Payer: Self-pay | Admitting: *Deleted

## 2012-10-07 ENCOUNTER — Emergency Department (HOSPITAL_COMMUNITY)
Admission: EM | Admit: 2012-10-07 | Discharge: 2012-10-07 | Disposition: A | Discharge status: Home / Self Care | Payer: Medicaid Other | Attending: Emergency Medicine | Admitting: Emergency Medicine

## 2012-10-07 DIAGNOSIS — J3489 Other specified disorders of nose and nasal sinuses: Secondary | ICD-10-CM | POA: Insufficient documentation

## 2012-10-07 DIAGNOSIS — Z91040 Latex allergy status: Secondary | ICD-10-CM | POA: Insufficient documentation

## 2012-10-07 DIAGNOSIS — H9190 Unspecified hearing loss, unspecified ear: Secondary | ICD-10-CM | POA: Insufficient documentation

## 2012-10-07 DIAGNOSIS — H6691 Otitis media, unspecified, right ear: Secondary | ICD-10-CM

## 2012-10-07 DIAGNOSIS — H6690 Otitis media, unspecified, unspecified ear: Secondary | ICD-10-CM | POA: Insufficient documentation

## 2012-10-07 MED ORDER — HYDROCODONE-ACETAMINOPHEN 5-325 MG PO TABS: 2.0000 | ORAL_TABLET | ORAL | Status: DC | PRN

## 2012-10-07 MED ORDER — AMOXICILLIN 500 MG PO CAPS: 500.0000 mg | ORAL_CAPSULE | Freq: Three times a day (TID) | ORAL | Status: DC

## 2012-10-07 MED ORDER — HYDROCODONE-ACETAMINOPHEN 5-325 MG PO TABS
2.0000 | ORAL_TABLET | Freq: Once | ORAL | Status: AC
  Administered 2012-10-07: 2 via ORAL
  Filled 2012-10-07: qty 2

## 2012-10-07 NOTE — ED Provider Notes (Signed)
History     CSN: [id number]  Arrival date & time 10/07/12  0203   First MD Initiated Contact with Patient 10/07/12 0230      Chief Complaint  Patient presents with  . Otalgia    (Consider location/radiation/quality/duration/timing/severity/associated sxs/prior treatment) Patient is a 27 y.o. female presenting with ear pain. The history is provided by the patient.  Otalgia Location:  Right Behind ear:  No abnormality Quality:  Sharp Severity:  Severe Onset quality:  Sudden Duration:  3 hours Timing:  Constant Progression:  Worsening Chronicity:  New Relieved by:  Nothing Worsened by:  Nothing tried Associated symptoms: congestion, hearing loss and rhinorrhea   Associated symptoms: no sore throat     Past Medical History  Diagnosis Date  . Seasonal allergies     Past Surgical History  Procedure Laterality Date  . Gynecologic cryosurgery      Family History  Problem Relation Age of Onset  . Cancer Mother   . Cancer Maternal Grandmother     History  Substance Use Topics  . Smoking status: Never Smoker   . Smokeless tobacco: Not on file  . Alcohol Use: No    OB History   Grav Para Term Preterm Abortions TAB SAB Ect Mult Living   3 3 1 2      3       Review of Systems  HENT: Positive for hearing loss, ear pain, congestion and rhinorrhea. Negative for sore throat.   All other systems reviewed and are negative.    Allergies  Flagyl; Pineapple; and Latex  Home Medications   Current Outpatient Rx  Name  Route  Sig  Dispense  Refill  . ibuprofen (ADVIL,MOTRIN) 600 MG tablet   Oral   Take 1 tablet (600 mg total) by mouth every 6 (six) hours as needed for pain.   30 tablet   5   . oxyCODONE-acetaminophen (PERCOCET/ROXICET) 5-325 MG per tablet   Oral   Take 1-2 tablets by mouth every 4 (four) hours as needed for pain (moderate - severe pain).   40 tablet   0   . pantoprazole (PROTONIX) 40 MG tablet   Oral   Take 40 mg by mouth daily.          . Prenatal Vit-Fe Fumarate-FA (PRENATAL MULTIVITAMIN) TABS   Oral   Take 1 tablet by mouth daily.           BP 128/88  Pulse 91  Temp(Src) 98.6 F (37 C) (Oral)  Resp 16  SpO2 100%  Physical Exam  Nursing note and vitals reviewed. Constitutional: She is oriented to person, place, and time. She appears well-developed and well-nourished. No distress.  HENT:  Head: Normocephalic and atraumatic.  Mouth/Throat: Oropharynx is clear and moist.  The right tm is erythematous and bulging.  Neck: Normal range of motion. Neck supple.  Cardiovascular: Normal rate, regular rhythm and normal heart sounds.   No murmur heard. Pulmonary/Chest: Effort normal and breath sounds normal. No respiratory distress.  Lymphadenopathy:    She has no cervical adenopathy.  Neurological: She is alert and oriented to person, place, and time.  Skin: Skin is warm and dry. She is not diaphoretic.    ED Course  Procedures (including critical care time)  Labs Reviewed - No data to display No results found.   No diagnosis found.    MDM  OM        Geoffery Lyons, MD 10/07/12 [phone]

## 2012-10-07 NOTE — ED Notes (Signed)
Pt presented to ED with soar throat,chills,cough and ear pain.

## 2012-10-07 NOTE — ED Notes (Signed)
Rt earache since 0100. HA, cold, and cough since Monday.

## 2012-10-07 NOTE — ED Notes (Signed)
Pt discharged.Vital signs stable and GCS 15 

## 2012-10-25 ENCOUNTER — Ambulatory Visit (INDEPENDENT_AMBULATORY_CARE_PROVIDER_SITE_OTHER): Payer: BC Managed Care – PPO | Admitting: Family Medicine

## 2012-10-25 VITALS — BP 120/68 | HR 73 | Temp 98.3°F | Resp 16 | Ht 60.75 in | Wt 149.6 lb

## 2012-10-25 DIAGNOSIS — H9209 Otalgia, unspecified ear: Secondary | ICD-10-CM

## 2012-10-25 DIAGNOSIS — H6690 Otitis media, unspecified, unspecified ear: Secondary | ICD-10-CM

## 2012-10-25 DIAGNOSIS — H6691 Otitis media, unspecified, right ear: Secondary | ICD-10-CM

## 2012-10-25 DIAGNOSIS — H9201 Otalgia, right ear: Secondary | ICD-10-CM

## 2012-10-25 MED ORDER — AZITHROMYCIN 250 MG PO TABS: ORAL_TABLET | ORAL | Status: DC

## 2012-10-25 NOTE — Progress Notes (Signed)
[facility]:  Office Visit  Chief Complaint:  Chief Complaint  Patient presents with  . Otalgia    right ear- recurrent. Took antibiotic started 6/8 and completed. Pain has come back  . Sore Throat  . Allergies    HPI: Sherri Castaneda is a 27 y.o. female who complains of right ear pain. Was seen for right ear OM at ER on 6/8. Was given abx amoxacillin and hydrocodone for pain. Now having same sxs which started yesterday. Going to [employer] in [place] soon so does not want to feel sick again.  No fevers, chills. + jaw pain.   Past Medical History  Diagnosis Date  . Seasonal allergies    Past Surgical History  Procedure Laterality Date  . Gynecologic cryosurgery     History   Social History  . Marital Status: Married    Spouse Name: N/A    Number of Children: N/A  . Years of Education: N/A   Social History Main Topics  . Smoking status: Never Smoker   . Smokeless tobacco: None  . Alcohol Use: No  . Drug Use: No  . Sexually Active: Yes   Other Topics Concern  . None   Social History Narrative  . None   Family History  Problem Relation Age of Onset  . Cancer Mother   . Cancer Maternal Grandmother   . Cancer Father    Allergies  Allergen Reactions  . Flagyl (Metronidazole) Swelling  . Pineapple Itching and Swelling  . Latex Itching and Rash   Prior to Admission medications   Medication Sig Start Date End Date Taking? Authorizing Provider  acetaminophen (TYLENOL) 500 MG tablet Take 1,000 mg by mouth every 6 (six) hours as needed for pain.   Yes Historical Provider, MD  DiphenhydrAMINE HCl (ALLERGY MEDICATION PO) Take 1 tablet by mouth daily. OTC allergy relief   Yes Historical Provider, MD  amoxicillin (AMOXIL) 500 MG capsule Take 1 capsule (500 mg total) by mouth 3 (three) times daily. 10/07/12   Geoffery Lyons, MD  HYDROcodone-acetaminophen (NORCO) 5-325 MG per tablet Take 2 tablets by mouth every 4 (four) hours as needed for pain. 10/07/12    Geoffery Lyons, MD     ROS: The patient denies fevers, chills, night sweats, unintentional weight loss, chest pain, palpitations, wheezing, dyspnea on exertion, nausea, vomiting, abdominal pain, dysuria, hematuria, melena, numbness, weakness, or tingling.   All other systems have been reviewed and were otherwise negative with the exception of those mentioned in the HPI and as above.    PHYSICAL EXAM: Filed Vitals:   10/25/12 1856  BP: 120/68  Pulse: 73  Temp: 98.3 F (36.8 C)  Resp: 16   Filed Vitals:   10/25/12 1856  Height: 5' 0.75" (1.543 m)  Weight: 149 lb 9.6 oz (67.858 kg)   Body mass index is 28.5 kg/(m^2).  General: Alert, no acute distress HEENT:  Normocephalic, atraumatic, oropharynx patent. Right TM + dull erythematous, fluid Cardiovascular:  Regular rate and rhythm, no rubs murmurs or gallops.  No Carotid bruits, radial pulse intact. No pedal edema.  Respiratory: Clear to auscultation bilaterally.  No wheezes, rales, or rhonchi.  No cyanosis, no use of accessory musculature GI: No organomegaly, abdomen is soft and non-tender, positive bowel sounds.  No masses. Skin: No rashes. Neurologic: Facial musculature symmetric. Psychiatric: Patient is appropriate throughout our interaction. Lymphatic: No cervical lymphadenopathy Musculoskeletal: Gait intact.   LABS: Results for orders placed during the hospital encounter of 03/28/12  OB RESULTS CONSOLE GBS      Result Value Range   GBS Positive    CBC      Result Value Range   WBC 11.0 (*) 4.0 - 10.5 K/uL   RBC 4.56  3.87 - 5.11 MIL/uL   Hemoglobin 10.7 (*) 12.0 - 15.0 g/dL   HCT 81.1 (*) 91.4 - 78.2 %   MCV 75.0 (*) 78.0 - 100.0 fL   MCH 23.5 (*) 26.0 - 34.0 pg   MCHC 31.3  30.0 - 36.0 g/dL   RDW 95.6  21.3 - 08.6 %   Platelets 182  150 - 400 K/uL  RPR      Result Value Range   RPR NON REACTIVE  NON REACTIVE  OB RESULTS CONSOLE GC/CHLAMYDIA      Result Value Range   Gonorrhea Negative    OB RESULTS CONSOLE  HIV ANTIBODY (ROUTINE TESTING)      Result Value Range   HIV Non-reactive    OB RESULTS CONSOLE RUBELLA ANTIBODY, IGM      Result Value Range   Rubella Immune    OB RESULTS CONSOLE VARICELLA ZOSTER ANTIBODY, IGG      Result Value Range   Varicella Immune    OB RESULTS CONSOLE HEPATITIS B SURFACE ANTIGEN      Result Value Range   Hepatitis B Surface Ag Negative    OB RESULTS CONSOLE GC/CHLAMYDIA      Result Value Range   Chlamydia Negative    OB RESULTS CONSOLE GC/CHLAMYDIA      Result Value Range   Chlamydia Negative    OB RESULTS CONSOLE RPR      Result Value Range   RPR Nonreactive    POCT FERN TEST      Result Value Range   POCT Fern Test Negative = intact amniotic membranes    OB RESULTS CONSOLE ABO/RH      Result Value Range   RH Type  Positive    OB RESULTS CONSOLE ABO/RH      Result Value Range   ABO Grouping O    OB RESULTS CONSOLE ANTIBODY SCREEN      Result Value Range   Antibody Screen Negative       EKG/XRAY:   Primary read interpreted by Dr. Conley Rolls at [facility].   ASSESSMENT/PLAN: Encounter Diagnoses  Name Primary?  . Otitis media, right Yes  . Otalgia of right ear    Rx azithromycin Ibuprofen for ear pain Gross sideeffects, risk and benefits, and alternatives of medications d/w patient. Patient is aware that all medications have potential sideeffects and we are unable to predict every sideeffect or drug-drug interaction that may occur. F/u prn    Kaidon Kinker PHUONG, DO 10/25/2012 8:23 PM

## 2013-02-15 ENCOUNTER — Inpatient Hospital Stay (HOSPITAL_COMMUNITY)
Admission: AD | Admit: 2013-02-15 | Discharge: 2013-02-15 | Disposition: A | Discharge status: Home / Self Care | Payer: Medicaid Other | Source: Ambulatory Visit | Attending: Obstetrics and Gynecology | Admitting: Obstetrics and Gynecology

## 2013-02-15 ENCOUNTER — Encounter (HOSPITAL_COMMUNITY): Payer: Self-pay

## 2013-02-15 DIAGNOSIS — K529 Noninfective gastroenteritis and colitis, unspecified: Secondary | ICD-10-CM

## 2013-02-15 DIAGNOSIS — O99891 Other specified diseases and conditions complicating pregnancy: Secondary | ICD-10-CM | POA: Insufficient documentation

## 2013-02-15 DIAGNOSIS — O210 Mild hyperemesis gravidarum: Secondary | ICD-10-CM | POA: Insufficient documentation

## 2013-02-15 DIAGNOSIS — K5289 Other specified noninfective gastroenteritis and colitis: Secondary | ICD-10-CM

## 2013-02-15 DIAGNOSIS — K219 Gastro-esophageal reflux disease without esophagitis: Secondary | ICD-10-CM | POA: Insufficient documentation

## 2013-02-15 LAB — URINALYSIS, ROUTINE W REFLEX MICROSCOPIC
Glucose, UA: NEGATIVE mg/dL
Hgb urine dipstick: NEGATIVE
Leukocytes, UA: NEGATIVE
Specific Gravity, Urine: 1.025 (ref 1.005–1.030)

## 2013-02-15 LAB — POCT PREGNANCY, URINE: Preg Test, Ur: POSITIVE — AB

## 2013-02-15 MED ORDER — FAMOTIDINE IN NACL 20-0.9 MG/50ML-% IV SOLN
20.0000 mg | Freq: Once | INTRAVENOUS | Status: AC
  Administered 2013-02-15: 20 mg via INTRAVENOUS
  Filled 2013-02-15: qty 50

## 2013-02-15 MED ORDER — ONDANSETRON HCL 4 MG PO TABS: 4.0000 mg | ORAL_TABLET | Freq: Three times a day (TID) | ORAL | Status: DC | PRN

## 2013-02-15 MED ORDER — PROMETHAZINE HCL 25 MG/ML IJ SOLN
25.0000 mg | Freq: Once | INTRAVENOUS | Status: AC
  Administered 2013-02-15: 25 mg via INTRAVENOUS
  Filled 2013-02-15: qty 1

## 2013-02-15 MED ORDER — PROMETHAZINE HCL 25 MG PO TABS: 25.0000 mg | ORAL_TABLET | Freq: Four times a day (QID) | ORAL | Status: DC | PRN

## 2013-02-15 NOTE — MAU Provider Note (Signed)
History     CSN: [id number]  Arrival date and time: 02/15/13 1116   First Provider Initiated Contact with Patient 02/15/13 1301      Chief Complaint  Patient presents with  . Possible Pregnancy  . Emesis  . Abdominal Pain   HPI  [name] is a 27 y.o. female [phone] at [redacted]w[redacted]d gestation who presents for a confirmation of pregnancy, nausea and vomiting along with abdominal pain. She is concerned because her whole family was exposed to a GI virus. She has had occasional loose stools, but nothing regular.  She has vomited 2-3 times today; has tried Pedialyte and has been able to keep that down. She denies vaginal discharge or bleeding. The pain is in her upper abdomen and radiates up her chest to her back; she rates her pain 10/10.  Pt is planning care with [facility] and plans to call Monday to schedule an appointment.   OB History   Grav Para Term Preterm Abortions TAB SAB Ect Mult Living   4 3 1 2      3       Past Medical History  Diagnosis Date  . Seasonal allergies   . Preterm labor     Delivery at 32 weeks and 34 weeks.    Past Surgical History  Procedure Laterality Date  . Gynecologic cryosurgery      Family History  Problem Relation Age of Onset  . Cancer Mother   . Cancer Maternal Grandmother   . Cancer Father     History  Substance Use Topics  . Smoking status: Never Smoker   . Smokeless tobacco: Not on file  . Alcohol Use: No    Allergies:  Allergies  Allergen Reactions  . Flagyl [Metronidazole] Swelling  . Pineapple Itching and Swelling  . Latex Itching and Rash    No prescriptions prior to admission   Results for orders placed during the hospital encounter of 02/15/13 (from the past 24 hour(s))  URINALYSIS, ROUTINE W REFLEX MICROSCOPIC     Status: Abnormal   Collection Time    02/15/13 10:45 AM      Result Value Range   Color, Urine YELLOW  YELLOW   APPearance CLEAR  CLEAR   Specific Gravity, Urine 1.025  1.005 - 1.030   pH 6.0   5.0 - 8.0   Glucose, UA NEGATIVE  NEGATIVE mg/dL   Hgb urine dipstick NEGATIVE  NEGATIVE   Bilirubin Urine NEGATIVE  NEGATIVE   Ketones, ur 15 (*) NEGATIVE mg/dL   Protein, ur NEGATIVE  NEGATIVE mg/dL   Urobilinogen, UA 0.2  0.0 - 1.0 mg/dL   Nitrite NEGATIVE  NEGATIVE   Leukocytes, UA NEGATIVE  NEGATIVE  POCT PREGNANCY, URINE     Status: Abnormal   Collection Time    02/15/13 11:51 AM      Result Value Range   Preg Test, Ur POSITIVE (*) NEGATIVE    Review of Systems  Constitutional: Positive for chills. Negative for fever.  Gastrointestinal: Positive for heartburn, nausea, vomiting, abdominal pain and diarrhea. Negative for constipation.       Upper abdominal pain that radiates up her chest  Genitourinary: Negative for dysuria, urgency, frequency and hematuria.       No vaginal discharge. No vaginal bleeding. No dysuria.   Musculoskeletal: Positive for back pain.   Physical Exam   Blood pressure 116/74, pulse 91, temperature 99 F (37.2 C), temperature source Oral, resp. rate 16, height 5' 0.25" (1.53 m), weight  152 lb 3.2 oz (69.037 kg), last menstrual period 01/06/2013, SpO2 100.00%, not currently breastfeeding.  Physical Exam  Constitutional: She is oriented to person, place, and time. She appears well-developed and well-nourished. She appears distressed.  Eyes: Pupils are equal, round, and reactive to light.  Neck: Neck supple.  GI: Soft. She exhibits no distension. There is tenderness. There is no rebound and no guarding.  Upper abdominal tenderness  Negative lower abdominal tenderness   Neurological: She is alert and oriented to person, place, and time.  Skin: Skin is warm. She is not diaphoretic.    MAU Course  Procedures None  MDM UA  UPT  IV phenergan infusion IVPB Pepcid  Following IV medication and Pepcid patient rates her pain a 0/10; pt is tolerating PO fluids and crackers.   Assessment and Plan  A: Gastroenteritis Nausea and vomiting  complicating pregnancy  GERD  P: Discharge home RX: Zofran         Phenergan  Return to MAU if symptoms worsen Start prenatal care as soon as possible First trimester precautions discussed  Ok to take TUMS as needed as directed on the bottle   RASCH, JENNIFER IRENE FNP-C  02/15/2013, 1:01 PM

## 2013-02-15 NOTE — MAU Note (Signed)
Pt states positive upt Tuesday. Here for upper abdominal pain. Keeping pedialyte down. Brother in law lives in home with them and has had stomach bug. Denies bleeding or abnormal vaginal discharge. Also having upper back pain that began with vomiting on Wednesday. Upper abdominal pain since Wednesday also. Last BM this am, was watery.

## 2013-02-15 NOTE — MAU Note (Signed)
Patient state she has had a positive home pregnancy test. Has had nausea and vomiting since 1200 10-15 and has not been able to keep anything down except Pedilite. Has upper abdominal sharp pain that radiates to upper back. Has had loose stools, no diarrhea.

## 2013-02-18 ENCOUNTER — Ambulatory Visit (INDEPENDENT_AMBULATORY_CARE_PROVIDER_SITE_OTHER): Payer: BC Managed Care – PPO | Admitting: Obstetrics

## 2013-02-18 ENCOUNTER — Encounter: Payer: Self-pay | Admitting: Obstetrics

## 2013-02-18 ENCOUNTER — Encounter: Payer: Self-pay | Admitting: Obstetrics & Gynecology

## 2013-02-18 VITALS — BP 114/71 | HR 96 | Temp 98.4°F | Wt 153.0 lb

## 2013-02-18 DIAGNOSIS — Z3481 Encounter for supervision of other normal pregnancy, first trimester: Secondary | ICD-10-CM

## 2013-02-18 DIAGNOSIS — Z3201 Encounter for pregnancy test, result positive: Secondary | ICD-10-CM

## 2013-02-18 NOTE — MAU Provider Note (Signed)
Attestation of Attending Supervision of Advanced Practitioner (CNM/NP): Evaluation and management procedures were performed by the Advanced Practitioner under my supervision and collaboration.  I have reviewed the Advanced Practitioner's note and chart, and I agree with the management and plan.  Emmet Messer 02/18/2013 2:37 PM   

## 2013-02-18 NOTE — Progress Notes (Signed)
Subjective:     Sherri Castaneda is a 27 y.o. female here for a routine exam.  Current complaints: new onset pregnancy. Pt states she was in the emergency room 02-15-13. Pt states she was severely dehydrated. Pt states she had some dizziness and light headed.  Personal health questionnaire reviewed: yes.   Gynecologic History Patient's last menstrual period was 01/06/2013. Contraception: none Last Pap: 08/2011. Results were: normal   Obstetric History OB History  Gravida Para Term Preterm AB SAB TAB Ectopic Multiple Living  4 3 1 2      3     # Outcome Date GA Lbr Len/2nd Weight Sex Delivery Anes PTL Lv  4 CUR           3 TRM 03/28/12 [redacted]w[redacted]d 06:45 / 00:52 8 lb 0.2 oz (3.635 kg) F SVD EPI  Y  2 PRE           1 PRE                The following portions of the patient's history were reviewed and updated as appropriate: allergies, current medications, past family history, past medical history, past social history, past surgical history and problem list.  Review of Systems Pertinent items are noted in HPI.    Objective:    No exam performed today, Consult only..    Assessment:    1st trimester pregnancy.   Plan:    Education reviewed: Early pregnancy.. Follow up in: 4 weeks.

## 2013-03-18 ENCOUNTER — Ambulatory Visit (INDEPENDENT_AMBULATORY_CARE_PROVIDER_SITE_OTHER): Payer: BC Managed Care – PPO | Admitting: Obstetrics & Gynecology

## 2013-03-18 ENCOUNTER — Encounter: Payer: Self-pay | Admitting: Obstetrics & Gynecology

## 2013-03-18 VITALS — BP 119/76 | Temp 98.5°F | Wt 155.6 lb

## 2013-03-18 DIAGNOSIS — Z3480 Encounter for supervision of other normal pregnancy, unspecified trimester: Secondary | ICD-10-CM | POA: Insufficient documentation

## 2013-03-18 DIAGNOSIS — O219 Vomiting of pregnancy, unspecified: Secondary | ICD-10-CM | POA: Insufficient documentation

## 2013-03-18 DIAGNOSIS — O09211 Supervision of pregnancy with history of pre-term labor, first trimester: Secondary | ICD-10-CM

## 2013-03-18 DIAGNOSIS — Z3481 Encounter for supervision of other normal pregnancy, first trimester: Secondary | ICD-10-CM

## 2013-03-18 DIAGNOSIS — O09219 Supervision of pregnancy with history of pre-term labor, unspecified trimester: Secondary | ICD-10-CM | POA: Insufficient documentation

## 2013-03-18 DIAGNOSIS — Z3201 Encounter for pregnancy test, result positive: Secondary | ICD-10-CM

## 2013-03-18 LAB — POCT URINALYSIS DIPSTICK
Blood, UA: NEGATIVE
Ketones, UA: NEGATIVE
Nitrite, UA: NEGATIVE
Urobilinogen, UA: NEGATIVE
pH, UA: 8

## 2013-03-18 NOTE — Addendum Note (Signed)
Addended by: Elby Beck F on: 03/18/2013 05:16 PM   Modules accepted: Orders

## 2013-03-18 NOTE — Progress Notes (Signed)
Subjective:    Sherri Castaneda is being seen today for her first obstetrical visit.  This is not a planned pregnancy. She is at [redacted]w[redacted]d gestation. Her obstetrical history is significant for preterm labor. Relationship with FOB: spouse, living together. Patient unsure intend to breast feed. Pregnancy history fully reviewed.  Menstrual History: OB History   Grav Para Term Preterm Abortions TAB SAB Ect Mult Living   4 3 1 2      3       Last Pap: 2013 WNL Menarche age: 37  Patient's last menstrual period was 01/06/2013.    The following portions of the patient's history were reviewed and updated as appropriate: allergies, current medications, past family history, past medical history, past social history, past surgical history and problem list.  Review of Systems Pertinent items are noted in HPI.    Objective:      General Appearance:    Alert, cooperative, no distress, appears stated age  Head:    Normocephalic, without obvious abnormality, atraumatic  Eyes:    PERRL, conjunctiva/corneas clear, EOM's intact, fundi    benign, both eyes  Ears:    Normal TM's and external ear canals, both ears  Nose:   Nares normal, septum midline, mucosa normal, no drainage    or sinus tenderness  Throat:   Lips, mucosa, and tongue normal; teeth and gums normal  Neck:   Supple, symmetrical, trachea midline, no adenopathy;    thyroid:  no enlargement/tenderness/nodules; no carotid   bruit or JVD  Back:     Symmetric, no curvature, ROM normal, no CVA tenderness  Lungs:     Clear to auscultation bilaterally, respirations unlabored  Chest Wall:    No tenderness or deformity   Heart:    Regular rate and rhythm, S1 and S2 normal, no murmur, rub   or gallop  Breast Exam:    No tenderness, masses, or nipple abnormality  Abdomen:     Soft, non-tender, bowel sounds active all four quadrants,    no masses, no organomegaly  Genitalia:    Normal female without lesion, discharge or tenderness  Extremities:    Extremities normal, atraumatic, no cyanosis or edema  Pulses:   2+ and symmetric all extremities  Skin:   Skin color, texture, turgor normal, no rashes or lesions  Lymph nodes:   Cervical, supraclavicular, and axillary nodes normal  Neurologic:   CNII-XII intact, normal strength, sensation and reflexes    throughout        Assessment:    Pregnancy at [redacted]w[redacted]d weeks    Plan:    Initial labs drawn. Prenatal vitamins.  Counseling provided regarding continued use of seat belts, cessation of alcohol consumption, smoking or use of illicit drugs; infection precautions i.e., influenza/TDAP immunizations, toxoplasmosis,CMV, parvovirus, listeria and varicella; workplace safety, exercise during pregnancy; routine dental care, safe medications, sexual activity, hot tubs, saunas, pools, travel, caffeine use, fish and methlymercury, potential toxins, hair treatments, varicose veins Weight gain recommendations reviewed: normal weight/BMI 18.5 - 24.9--> gain 25 - 35 lbsProblem list reviewed and updated. FIRST/CF mutation testing. Amniocentesis discussed: not indicated. Follow up in 4 weeks. 50% of 20 min visit spent on counseling and coordination of care.

## 2013-03-18 NOTE — Patient Instructions (Signed)
Influenza Virus Vaccine (Flucelvax) What is this medicine? INFLUENZA VIRUS VACCINE (in floo EN zuh VAHY ruhs vak SEEN) helps to reduce the risk of getting influenza also known as the flu. The vaccine only helps protect you against some strains of the flu. This medicine may be used for other purposes; ask your health care provider or pharmacist if you have questions. COMMON BRAND NAME(S): FLUCELVAX What should I tell my health care provider before I take this medicine? They need to know if you have any of these conditions: -bleeding disorder like hemophilia -fever or infection -Guillain-Barre syndrome or other neurological problems -immune system problems -infection with the human immunodeficiency virus (HIV) or AIDS -low blood platelet counts -multiple sclerosis -an unusual or allergic reaction to influenza virus vaccine, other medicines, foods, dyes or preservatives -pregnant or trying to get pregnant -breast-feeding How should I use this medicine? This vaccine is for injection into a muscle. It is given by a health care professional. A copy of Vaccine Information Statements will be given before each vaccination. Read this sheet carefully each time. The sheet may change frequently. Talk to your pediatrician regarding the use of this medicine in children. Special care may be needed. Overdosage: If you think you've taken too much of this medicine contact a poison control center or emergency room at once. Overdosage: If you think you have taken too much of this medicine contact a poison control center or emergency room at once. NOTE: This medicine is only for you. Do not share this medicine with others. What if I miss a dose? This does not apply. What may interact with this medicine? -chemotherapy or radiation therapy -medicines that lower your immune system like etanercept, anakinra, infliximab, and adalimumab -medicines that treat or prevent blood clots like  warfarin -phenytoin -steroid medicines like prednisone or cortisone -theophylline -vaccines This list may not describe all possible interactions. Give your health care provider a list of all the medicines, herbs, non-prescription drugs, or dietary supplements you use. Also tell them if you smoke, drink alcohol, or use illegal drugs. Some items may interact with your medicine. What should I watch for while using this medicine? Report any side effects that do not go away within 3 days to your doctor or health care professional. Call your health care provider if any unusual symptoms occur within 6 weeks of receiving this vaccine. You may still catch the flu, but the illness is not usually as bad. You cannot get the flu from the vaccine. The vaccine will not protect against colds or other illnesses that may cause fever. The vaccine is needed every year. What side effects may I notice from receiving this medicine? Side effects that you should report to your doctor or health care professional as soon as possible: -allergic reactions like skin rash, itching or hives, swelling of the face, lips, or tongue Side effects that usually do not require medical attention (Report these to your doctor or health care professional if they continue or are bothersome.): -fever -headache -muscle aches and pains -pain, tenderness, redness, or swelling at the injection site -tiredness This list may not describe all possible side effects. Call your doctor for medical advice about side effects. You may report side effects to FDA at 1-800-FDA-1088. Where should I keep my medicine? The vaccine will be given by a health care professional in a clinic, pharmacy, doctor's office, or other health care setting. You will not be given vaccine doses to store at home. NOTE: This sheet is a summary.   It may not cover all possible information. If you have questions about this medicine, talk to your doctor, pharmacist, or health care  provider.  2014, [employer] Standard. (2011-03-30 14:06:47) Prenatal Care  WHAT IS PRENATAL CARE?  Prenatal care means health care during your pregnancy, before your baby is born. Take care of yourself and your baby by:   Getting early prenatal care. If you know you are pregnant, or think you might be pregnant, call your caregiver as soon as possible. Schedule a visit for a general/prenatal examination.  Getting regular prenatal care. Follow your caregiver's schedule for blood and other necessary tests. Do not miss appointments.  Do everything you can to keep yourself and your baby healthy during your pregnancy.  Prenatal care should include evaluation of medical, dietary, educational, psychological, and social needs for the couple and the medical, surgical, and genetic history of the family of the mother and father.  Discuss with your caregiver:  Your medicines, prescription, over-the-counter, and herbal medicines.  Substance abuse, alcohol, smoking, and illegal drugs.  Domestic abuse and violence, if present.  Your immunizations.  Nutrition and diet.  Exercising.  Environment and occupational hazards, at home and at work.  History of sexually transmitted disease, both you and your partner.  Previous pregnancies. WHY IS PRENATAL CARE SO IMPORTANT?  By seeing you regularly, your caregiver has the chance to find problems early, so that they can be treated as soon as possible. Other problems might be prevented. Many studies have shown that early and regular prenatal care is important for the health of both mothers and their babies.  I AM THINKING ABOUT GETTING PREGNANT. HOW CAN I TAKE CARE OF MYSELF?  Taking care of yourself before you get pregnant helps you to have a healthy pregnancy. It also lowers your chances of having a baby born with a birth defect. Here are ways to take care of yourself before you get pregnant:   Eat healthy foods, exercise regularly (30 minutes per  day for most days of the week is best), and get enough rest and sleep. Talk to your caregiver about what kinds of foods and exercises are best for you.  Take 400 micrograms (mcg) of folic acid (one of the B vitamins) every day. The best way to do this is to take a daily multivitamin pill that contains this amount of folic acid. Getting enough of the synthetic (manufactured) form of folic acid every day before you get pregnant and during early pregnancy can help prevent certain birth defects. Many breakfast cereals and other grain products have folic acid added to them, but only certain cereals contain 400 mcg of folic acid per serving. Check the label on your multivitamin or cereal to find the amount of folic acid in the food.  See your caregiver for a complete check up before getting pregnant. Make sure that you have had all your immunization shots, especially for rubella ([place] measles). Rubella can cause serious birth defects. Chickenpox is another illness you want to avoid during pregnancy. If you have had chickenpox and rubella in the past, you should be immune to them.  Tell your caregiver about any prescription or non-prescription medicines (including herbal remedies) you are taking. Some medicines are not safe to take during pregnancy.  Stop smoking cigarettes, drinking alcohol, or taking illegal drugs. Ask your caregiver for help, if you need it. You can also get help with alcohol and drugs by talking with a member of your faith community, a counselor, or a  trusted friend.  Discuss and treat any medical, social, or psychological problems before getting pregnant.  Discuss any history of genetic problems in the mother, father, and their families. Do genetic testing before getting pregnant, when possible.  Discuss any physical or emotional abuse with your caregiver.  Discuss with your caregiver if you might be exposed to harmful chemicals on your job or where you live.  Discuss with your  caregiver if you think your job or the hours you work may be harmful and should be changed.  The father should be involved with the decision making and with all aspects of the pregnancy, labor, and delivery.  If you have medical insurance, make sure you are covered for pregnancy. I JUST FOUND OUT THAT I AM PREGNANT. HOW CAN I TAKE CARE OF MYSELF?  Here are ways to take care of yourself and the precious new life growing inside you:   Continue taking your multivitamin with 400 micrograms (mcg) of folic acid every day.  Get early and regular prenatal care. It does not matter if this is your first pregnancy or if you already have children. It is very important to see a caregiver during your pregnancy. Your caregiver will check at each visit to make sure that you and the baby are healthy. If there are any problems, action can be taken right away to help you and the baby.  Eat a healthy diet that includes:  Fruits.  Vegetables.  Foods low in saturated fat.  Grains.  Calcium-rich foods.  Drink 6 to 8 glasses of liquids a day.  Unless your caregiver tells you not to, try to be physically active for 30 minutes, most days of the week. If you are pressed for time, you can get your activity in through 10 minute segments, three times a day.  If you smoke, drink alcohol, or use drugs, STOP. These can cause long-term damage to your baby. Talk with your caregiver about steps to take to stop smoking. Talk with a member of your faith community, a counselor, a trusted friend, or your caregiver if you are concerned about your alcohol or drug use.  Ask your caregiver before taking any medicine, even over-the-counter medicines. Some medicines are not safe to take during pregnancy.  Get plenty of rest and sleep.  Avoid hot tubs and saunas during pregnancy.  Do not have X-rays taken, unless absolutely necessary and with the recommendation of your caregiver. A lead shield can be placed on your abdomen, to  protect the baby when X-rays are taken in other parts of the body.  Do not empty the cat litter when you are pregnant. It may contain a parasite that causes an infection called toxoplasmosis, which can cause birth defects. Also, use gloves when working in garden areas used by cats.  Do not eat uncooked or undercooked cheese, meats, or fish.  Stay away from toxic chemicals like:  Insecticides.  Solvents (some cleaners or paint thinners).  Lead.  Mercury.  Sexual relations may continue until the end of the pregnancy, unless you have a medical problem or there is a problem with the pregnancy and your caregiver tells you not to.  Do not wear high heel shoes, especially during the second half of the pregnancy. You can lose your balance and fall.  Do not take long trips, unless absolutely necessary. Be sure to see your caregiver before going on the trip.  Do not sit in one position for more than 2 hours, when on a trip.  Take a copy of your medical records when going on a trip.  Know where there is a hospital in the city you are visiting, in case of an emergency.  Most dangerous household products will have pregnancy warnings on their labels. Ask your caregiver about products if you are unsure.  Limit or eliminate your caffeine intake from coffee, tea, sodas, medicines, and chocolate.  Many women continue working through pregnancy. Staying active might help you stay healthier. If you have a question about the safety or the hours you work at your particular job, talk with your caregiver.  Get informed:  Read books.  Watch videos.  Go to childbirth classes for you and the father.  Talk with experienced moms.  Ask your caregiver about childbirth education classes for you and your partner. Classes can help you and your partner prepare for the birth of your baby.  Ask about a pediatrician (baby doctor) and methods and pain medicine for labor, delivery, and possible Cesarean  delivery (C-section). I AM NOT THINKING ABOUT GETTING PREGNANT RIGHT NOW, BUT HEARD THAT ALL WOMEN SHOULD TAKE FOLIC ACID EVERY DAY?  All women of childbearing age, with even a remote chance of getting pregnant, should try to make sure they get enough folic acid. Many pregnancies are not planned. Many women do not know they are actually pregnant early in their pregnancies, and certain birth defects happen in the very early part of pregnancy. Taking 400 micrograms (mcg) of folic acid every day will help prevent certain birth defects that happen in the early part of pregnancy. If a woman begins taking vitamin pills in the second or third month of pregnancy, it may be too late to prevent birth defects. Folic acid may also have other health benefits for women, besides preventing birth defects.  HOW OFTEN SHOULD I SEE MY CAREGIVER DURING PREGNANCY?  Your caregiver will give you a schedule for your prenatal visits. You will have visits more often as you get closer to the end of your pregnancy. An average pregnancy lasts about 40 weeks.  A typical schedule includes visiting your caregiver:   About once each month, during your first 6 months of pregnancy.  Every 2 weeks, during the next 2 months.  Weekly in the last month, until the delivery date. Your caregiver will probably want to see you more often if:  You are over 35.  Your pregnancy is high risk, because you have certain health problems or problems with the pregnancy, such as:  Diabetes.  High blood pressure.  The baby is not growing on schedule, according to the dates of the pregnancy. Your caregiver will do special tests, to make sure you and the baby are not having any serious problems. WHAT HAPPENS DURING PRENATAL VISITS?   At your first prenatal visit, your caregiver will talk to you about you and your partner's health history and your family's health history, and will do a physical exam.  On your first visit, a physical exam will  include checks of your blood pressure, height and weight, and an exam of your pelvic organs. Your caregiver will do a Pap test if you have not had one recently, and will do cultures of your cervix to make sure there is no infection.  At each visit, there will be tests of your blood, urine, blood pressure, weight, and checking the progress of the baby.  Your caregiver will be able to tell you when to expect that your baby will be born.  Each visit  is also a chance for you to learn about staying healthy during pregnancy and for asking questions.  Discuss whether you will be breastfeeding.  At your later prenatal visits, your caregiver will check how you are doing and how the baby is developing. You may have a number of tests done as your pregnancy progresses.  Ultrasound exams are often used to check on the baby's growth and health.  You may have more urine and blood tests, as well as special tests, if needed. These may include amniocentesis (examine fluid in the pregnancy sac), stress tests (check how baby responds to contractions), biophysical profile (measures fetus well-being). Your caregiver will explain the tests and why they are necessary. I AM IN MY LATE THIRTIES, AND I WANT TO HAVE A CHILD NOW. SHOULD I DO ANYTHING SPECIAL?  As you get older, there is more chance of having a medical problem (high blood pressure), pregnancy problem (preeclampsia, problems with the placenta), miscarriage, or a baby born with a birth defect. However, most women in their late thirties and early forties have healthy babies. See your caregiver on a regular basis before you get pregnant and be sure to go for exams throughout your pregnancy. Your caregiver probably will want to do some special tests to check on you and your baby's health when you are pregnant.  Women today are often delaying having children until later in life, when they are in their thirties and forties. While many women in their thirties and  forties have no difficulty getting pregnant, fertility does decline with age. For women over 40 who cannot get pregnant after 6 months of trying, it is recommended that they see their caregiver for a fertility evaluation. It is not uncommon to have trouble becoming pregnant or experience infertility (inability to become pregnant after trying for one year). If you think that you or your partner may be infertile, you can discuss this with your caregiver. He or she can recommend treatments such as drugs, surgery, or assisted reproductive technology.  Document Released: 04/21/2003 Document Revised: 07/11/2011 Document Reviewed: 10/04/2012 [facility] Patient Information 2014 [place], [place]. CF Gene Mutation Testing This is a test used to detect cystic fibrosis (CF) genetic mutations to establish CF carrier status or to establish the diagnosis of CF in an individual. The CF gene mutation test identifies mutations in the CFTR gene on chromosome 7. Each cell in the human body (except sperm and eggs) has 46 chromosomes (23 inherited from the mother and 23 from the father). Genes on these chromosomes form the body's blueprint for producing proteins that control body functions. Cystic fibrosis is caused by a mutation in a pair of genes located on chromosomes 7. Both copies of this gene must be abnormal to cause CF. If only one copy of the gene pair is mutated, the patient will be a carrier. Carriers are not ill, they do not have any symptoms, but they can pass their abnormal CF gene copy on to their children.  When a newborn infant has meconium ileus (no stools in the first 24 to 48 hours of life) or when a person has symptoms of CF (salty sweat, persistent respiratory infections, wheezing, persistent diarrhea, foul-smelling greasy stools, malnutrition, and vitamin deficiency); if a person has a positive sweat chloride or IRT test or a close relative who has been diagnosed with CF; when a patient is undergoing genetic  counseling and wants to find out if they are a CF carrier; or for prenatal diagnosis. PREPARATION FOR TEST A  blood sample drawn from an infant's heel; a spot of blood that is put onto filter paper; or a blood sample is drawn from a vein in the arm. NORMAL FINDINGS No genetic mutation. Ranges for normal findings may vary among different laboratories and hospitals. You should always check with your doctor after having lab work or other tests done to discuss the meaning of your test results and whether your values are considered within normal limits. MEANING OF TEST Your caregiver will go over the test results with you and discuss the importance and meaning of your results, as well as treatment options and the need for additional tests if necessary. OBTAINING THE TEST RESULTS It is your responsibility to obtain your test results. Ask the lab or department performing the test when and how you will get your results. Document Released: 05/12/2004 Document Revised: 07/11/2011 Document Reviewed: 03/26/2008 [facility] Patient Information 2014 [place], [place].

## 2013-03-19 LAB — OBSTETRIC PANEL
Antibody Screen: NEGATIVE
Basophils Absolute: 0 10*3/uL (ref 0.0–0.1)
Eosinophils Absolute: 0.1 10*3/uL (ref 0.0–0.7)
Eosinophils Relative: 1 % (ref 0–5)
HCT: 36.5 % (ref 36.0–46.0)
Hemoglobin: 12.2 g/dL (ref 12.0–15.0)
Lymphocytes Relative: 27 % (ref 12–46)
MCHC: 33.4 g/dL (ref 30.0–36.0)
MCV: 74.6 fL — ABNORMAL LOW (ref 78.0–100.0)
Monocytes Absolute: 0.6 10*3/uL (ref 0.1–1.0)
Monocytes Relative: 7 % (ref 3–12)
Neutro Abs: 5.9 10*3/uL (ref 1.7–7.7)
Neutrophils Relative %: 65 % (ref 43–77)
RDW: 14.7 % (ref 11.5–15.5)
Rh Type: POSITIVE
Rubella: 8.71 Index — ABNORMAL HIGH (ref <0.90)
WBC: 8.9 10*3/uL (ref 4.0–10.5)

## 2013-03-19 LAB — VARICELLA ZOSTER ANTIBODY, IGG: Varicella IgG: 1405 Index — ABNORMAL HIGH (ref <135.00)

## 2013-03-19 LAB — GC/CHLAMYDIA PROBE AMP: CT Probe RNA: NEGATIVE

## 2013-03-19 LAB — HIV ANTIBODY (ROUTINE TESTING W REFLEX): HIV: NONREACTIVE

## 2013-03-19 LAB — VITAMIN D 25 HYDROXY (VIT D DEFICIENCY, FRACTURES): Vit D, 25-Hydroxy: 20 ng/mL — ABNORMAL LOW (ref 30–89)

## 2013-03-19 LAB — CULTURE, OB URINE
Colony Count: NO GROWTH
Organism ID, Bacteria: NO GROWTH

## 2013-03-20 ENCOUNTER — Encounter: Payer: Self-pay | Admitting: Obstetrics & Gynecology

## 2013-03-20 ENCOUNTER — Other Ambulatory Visit: Payer: Self-pay | Admitting: Obstetrics & Gynecology

## 2013-03-20 ENCOUNTER — Ambulatory Visit (INDEPENDENT_AMBULATORY_CARE_PROVIDER_SITE_OTHER): Payer: BC Managed Care – PPO

## 2013-03-20 DIAGNOSIS — O3680X1 Pregnancy with inconclusive fetal viability, fetus 1: Secondary | ICD-10-CM

## 2013-03-20 DIAGNOSIS — O3680X0 Pregnancy with inconclusive fetal viability, not applicable or unspecified: Secondary | ICD-10-CM

## 2013-03-20 DIAGNOSIS — O3660X0 Maternal care for excessive fetal growth, unspecified trimester, not applicable or unspecified: Secondary | ICD-10-CM

## 2013-03-20 LAB — US OB DETAIL + 14 WK

## 2013-03-26 ENCOUNTER — Other Ambulatory Visit: Payer: Self-pay | Admitting: *Deleted

## 2013-03-26 DIAGNOSIS — E559 Vitamin D deficiency, unspecified: Secondary | ICD-10-CM

## 2013-03-26 LAB — OB RESULTS CONSOLE GC/CHLAMYDIA
CHLAMYDIA, DNA PROBE: NEGATIVE
GC PROBE AMP, GENITAL: NEGATIVE

## 2013-03-26 MED ORDER — OB COMPLETE PETITE 35-5-1-200 MG PO CAPS: 1.0000 | ORAL_CAPSULE | Freq: Every day | ORAL | Status: DC

## 2013-04-15 ENCOUNTER — Ambulatory Visit (INDEPENDENT_AMBULATORY_CARE_PROVIDER_SITE_OTHER): Payer: BC Managed Care – PPO | Admitting: Obstetrics & Gynecology

## 2013-04-15 VITALS — BP 124/75 | Temp 98.4°F | Wt 157.0 lb

## 2013-04-15 DIAGNOSIS — O09211 Supervision of pregnancy with history of pre-term labor, first trimester: Secondary | ICD-10-CM

## 2013-04-15 DIAGNOSIS — O09219 Supervision of pregnancy with history of pre-term labor, unspecified trimester: Secondary | ICD-10-CM

## 2013-04-15 DIAGNOSIS — Z3480 Encounter for supervision of other normal pregnancy, unspecified trimester: Secondary | ICD-10-CM

## 2013-04-15 DIAGNOSIS — Z3481 Encounter for supervision of other normal pregnancy, first trimester: Secondary | ICD-10-CM

## 2013-04-15 LAB — POCT URINALYSIS DIPSTICK
Bilirubin, UA: NEGATIVE
Blood, UA: NEGATIVE
Ketones, UA: NEGATIVE
Leukocytes, UA: NEGATIVE
Spec Grav, UA: 1.015
Urobilinogen, UA: NEGATIVE
pH, UA: 6.5

## 2013-04-15 MED ORDER — AZITHROMYCIN 250 MG PO TABS: 250.0000 mg | ORAL_TABLET | Freq: Once | ORAL | Status: DC

## 2013-04-15 NOTE — Progress Notes (Signed)
Pulse 86 Pt states that she has a prolong cough with some drainage. Pt states that she is also having some lower left side pain when coughing.  URI.  Subjective:    Sherri Castaneda is a 27 y.o. [id number] [redacted]w[redacted]d being seen today for her obstetrical visit.  Patient reports {sx:14538}. Fetal movement: {fetal movement:14572}.  Objective:    BP 124/75  Temp(Src) 98.4 F (36.9 C)  Wt 71.215 kg (157 lb)  LMP 01/06/2013  Physical Exam  Exam  FHT: Fetal Heart Rate (bpm): 160  Uterine Size:    Presentation:       Assessment:    Pregnancy:  [id number]    Plan:    Patient Active Problem List   Diagnosis Date Noted  . Supervision of other normal pregnancy 03/18/2013  . Pregnancy related nausea and vomiting, antepartum 03/18/2013  . Previous preterm delivery, antepartum 03/18/2013    {ob counseling:14517} Follow up in {follow up OB:14565}.     {*** HELP TEXT ***  This SmartLink requires parameters for processing. Parameters are variables that can be added to the original SmartLink name. This allows the user to request specific information by giving the SmartLink precise direction.  The lastlab SmartLink accepts a list of result component base names separated by commas. The user can also request the number of results to display for each component. To indicate the number of results for each component, type the component name followed by a colon and then the number of results. (Component name:4). For all results for that particular component, type a star in place of the number (Component name:*). To obtain the last result for a particular component, type the component base name without the colon, number, or star. Ex: .lastlab[RBC. Depending on how the SmartLink is setup, results may be limited by a lookback period. In this case, any results older than the lookback period will not be displayed.  The SmartLink will display the name of the component(s) with the units used next to the name followed  by a table listing the date, result value, low and high reference range, and the result status for each result requested.  Example: .lastlab[MCH:3,MCV:*,HCT   This example would display a table for all components whose base name matches '[facility]', 'MCV', or 'HCT'. The first would contain the last three results of each component that has a base name of [facility], the second would contain all the entered results for components with MCV as the base name, and the third would contain the last entered result for all components with a base name of HCT.}

## 2013-04-22 ENCOUNTER — Ambulatory Visit: Payer: BC Managed Care – PPO

## 2013-04-22 ENCOUNTER — Ambulatory Visit (INDEPENDENT_AMBULATORY_CARE_PROVIDER_SITE_OTHER): Payer: BC Managed Care – PPO | Admitting: *Deleted

## 2013-04-22 VITALS — BP 120/75 | HR 98 | Temp 98.1°F | Wt 156.0 lb

## 2013-04-22 DIAGNOSIS — O219 Vomiting of pregnancy, unspecified: Secondary | ICD-10-CM

## 2013-04-22 DIAGNOSIS — O09219 Supervision of pregnancy with history of pre-term labor, unspecified trimester: Secondary | ICD-10-CM

## 2013-04-22 MED ORDER — ONDANSETRON HCL 4 MG PO TABS: 4.0000 mg | ORAL_TABLET | Freq: Three times a day (TID) | ORAL | Status: DC | PRN

## 2013-04-22 MED ORDER — HYDROXYPROGESTERONE CAPROATE 250 MG/ML IM OIL
250.0000 mg | TOPICAL_OIL | INTRAMUSCULAR | Status: AC
  Administered 2013-04-22 – 2013-09-02 (×19): 250 mg via INTRAMUSCULAR

## 2013-04-22 NOTE — Progress Notes (Signed)
Pt in office today for 17p injection.  Injection given at 1155. Pt tolerated well.

## 2013-04-23 ENCOUNTER — Encounter: Payer: Self-pay | Admitting: Obstetrics & Gynecology

## 2013-05-02 NOTE — L&D Delivery Note (Signed)
Delivery Note At 9:48 AM a viable female was delivered via Vaginal, Spontaneous Delivery (Presentation: ; Occiput Anterior).    Placenta status: Intact, Spontaneous via [place].  Cord: 3 vessels with the following complications: None.    Anesthesia: Epidural  Episiotomy: None  Lacerations: None Suture Repair: n/a Est. Blood Loss (mL): 200 ml  Mom to postpartum.  Baby to Couplet care / Skin to Skin.  JACKSON-MOORE,Tessy Pawelski A 10/09/2013, 10:17 AM

## 2013-05-06 ENCOUNTER — Other Ambulatory Visit: Payer: Self-pay | Admitting: *Deleted

## 2013-05-06 ENCOUNTER — Ambulatory Visit (INDEPENDENT_AMBULATORY_CARE_PROVIDER_SITE_OTHER): Payer: BC Managed Care – PPO | Admitting: *Deleted

## 2013-05-06 VITALS — BP 121/73 | HR 91 | Temp 98.0°F | Wt 156.0 lb

## 2013-05-06 DIAGNOSIS — E559 Vitamin D deficiency, unspecified: Secondary | ICD-10-CM

## 2013-05-06 DIAGNOSIS — O09219 Supervision of pregnancy with history of pre-term labor, unspecified trimester: Secondary | ICD-10-CM

## 2013-05-06 DIAGNOSIS — O0990 Supervision of high risk pregnancy, unspecified, unspecified trimester: Secondary | ICD-10-CM

## 2013-05-06 MED ORDER — OB COMPLETE PETITE 35-5-1-200 MG PO CAPS: 1.0000 | ORAL_CAPSULE | Freq: Every day | ORAL | Status: DC

## 2013-05-06 NOTE — Progress Notes (Signed)
Patient in office today for a a 17 P injection. Patient tolerated injection well.

## 2013-05-08 ENCOUNTER — Encounter: Payer: Self-pay | Admitting: Obstetrics & Gynecology

## 2013-05-08 ENCOUNTER — Ambulatory Visit (INDEPENDENT_AMBULATORY_CARE_PROVIDER_SITE_OTHER): Payer: BC Managed Care – PPO

## 2013-05-08 DIAGNOSIS — Z1389 Encounter for screening for other disorder: Secondary | ICD-10-CM

## 2013-05-08 DIAGNOSIS — O0990 Supervision of high risk pregnancy, unspecified, unspecified trimester: Secondary | ICD-10-CM

## 2013-05-08 LAB — US OB DETAIL + 14 WK

## 2013-05-13 ENCOUNTER — Ambulatory Visit (INDEPENDENT_AMBULATORY_CARE_PROVIDER_SITE_OTHER): Payer: BC Managed Care – PPO | Admitting: Obstetrics & Gynecology

## 2013-05-13 VITALS — BP 117/73 | Temp 97.9°F | Wt 159.0 lb

## 2013-05-13 DIAGNOSIS — O09219 Supervision of pregnancy with history of pre-term labor, unspecified trimester: Secondary | ICD-10-CM

## 2013-05-13 DIAGNOSIS — Z3482 Encounter for supervision of other normal pregnancy, second trimester: Secondary | ICD-10-CM

## 2013-05-13 DIAGNOSIS — Z3480 Encounter for supervision of other normal pregnancy, unspecified trimester: Secondary | ICD-10-CM

## 2013-05-13 LAB — POCT URINALYSIS DIPSTICK
BILIRUBIN UA: NEGATIVE
Glucose, UA: NEGATIVE
Ketones, UA: NEGATIVE
LEUKOCYTES UA: NEGATIVE
Nitrite, UA: NEGATIVE
Protein, UA: NEGATIVE
RBC UA: NEGATIVE
Spec Grav, UA: 1.015
UROBILINOGEN UA: NEGATIVE
pH, UA: 7

## 2013-05-13 NOTE — Progress Notes (Signed)
Pulse- 86 Doing well.

## 2013-05-21 ENCOUNTER — Ambulatory Visit (INDEPENDENT_AMBULATORY_CARE_PROVIDER_SITE_OTHER): Payer: BC Managed Care – PPO | Admitting: *Deleted

## 2013-05-21 VITALS — BP 123/76 | HR 88 | Temp 98.3°F | Ht 60.0 in | Wt 159.0 lb

## 2013-05-21 DIAGNOSIS — Z8751 Personal history of pre-term labor: Secondary | ICD-10-CM

## 2013-05-21 DIAGNOSIS — O09219 Supervision of pregnancy with history of pre-term labor, unspecified trimester: Secondary | ICD-10-CM

## 2013-05-21 NOTE — Progress Notes (Signed)
Pt in office for 17P injection

## 2013-05-22 NOTE — Patient Instructions (Signed)
Second Trimester of Pregnancy The second trimester is from week 13 through week 28, months 4 through 6. The second trimester is often a time when you feel your best. Your body has also adjusted to being pregnant, and you begin to feel better physically. Usually, morning sickness has lessened or quit completely, you may have more energy, and you may have an increase in appetite. The second trimester is also a time when the fetus is growing rapidly. At the end of the sixth month, the fetus is about 9 inches long and weighs about 1 pounds. You will likely begin to feel the baby move (quickening) between 18 and 20 weeks of the pregnancy. BODY CHANGES Your body goes through many changes during pregnancy. The changes vary from woman to woman.   Your weight will continue to increase. You will notice your lower abdomen bulging out.  You may begin to get stretch marks on your hips, abdomen, and breasts.  You may develop headaches that can be relieved by medicines approved by your caregiver.  You may urinate more often because the fetus is pressing on your bladder.  You may develop or continue to have heartburn as a result of your pregnancy.  You may develop constipation because certain hormones are causing the muscles that push waste through your intestines to slow down.  You may develop hemorrhoids or swollen, bulging veins (varicose veins).  You may have back pain because of the weight gain and pregnancy hormones relaxing your joints between the bones in your pelvis and as a result of a shift in weight and the muscles that support your balance.  Your breasts will continue to grow and be tender.  Your gums may bleed and may be sensitive to brushing and flossing.  Dark spots or blotches (chloasma, mask of pregnancy) may develop on your face. This will likely fade after the baby is born.  A dark line from your belly button to the pubic area (linea nigra) may appear. This will likely fade after the  baby is born. WHAT TO EXPECT AT YOUR PRENATAL VISITS During a routine prenatal visit:  You will be weighed to make sure you and the fetus are growing normally.  Your blood pressure will be taken.  Your abdomen will be measured to track your baby's growth.  The fetal heartbeat will be listened to.  Any test results from the previous visit will be discussed. Your caregiver may ask you:  How you are feeling.  If you are feeling the baby move.  If you have had any abnormal symptoms, such as leaking fluid, bleeding, severe headaches, or abdominal cramping.  If you have any questions. Other tests that may be performed during your second trimester include:  Blood tests that check for:  Low iron levels (anemia).  Gestational diabetes (between 24 and 28 weeks).  Rh antibodies.  Urine tests to check for infections, diabetes, or protein in the urine.  An ultrasound to confirm the proper growth and development of the baby.  An amniocentesis to check for possible genetic problems.  Fetal screens for spina bifida and Down syndrome. HOME CARE INSTRUCTIONS   Avoid all smoking, herbs, alcohol, and unprescribed drugs. These chemicals affect the formation and growth of the baby.  Follow your caregiver's instructions regarding medicine use. There are medicines that are either safe or unsafe to take during pregnancy.  Exercise only as directed by your caregiver. Experiencing uterine cramps is a good sign to stop exercising.  Continue to eat regular,   healthy meals.  Wear a good support bra for breast tenderness.  Do not use hot tubs, steam rooms, or saunas.  Wear your seat belt at all times when driving.  Avoid raw meat, uncooked cheese, cat litter boxes, and soil used by cats. These carry germs that can cause birth defects in the baby.  Take your prenatal vitamins.  Try taking a stool softener (if your caregiver approves) if you develop constipation. Eat more high-fiber foods,  such as fresh vegetables or fruit and whole grains. Drink plenty of fluids to keep your urine clear or pale yellow.  Take warm sitz baths to soothe any pain or discomfort caused by hemorrhoids. Use hemorrhoid cream if your caregiver approves.  If you develop varicose veins, wear support hose. Elevate your feet for 15 minutes, 3 4 times a day. Limit salt in your diet.  Avoid heavy lifting, wear low heel shoes, and practice good posture.  Rest with your legs elevated if you have leg cramps or low back pain.  Visit your dentist if you have not gone yet during your pregnancy. Use a soft toothbrush to brush your teeth and be gentle when you floss.  A sexual relationship may be continued unless your caregiver directs you otherwise.  Continue to go to all your prenatal visits as directed by your caregiver. SEEK MEDICAL CARE IF:   You have dizziness.  You have mild pelvic cramps, pelvic pressure, or nagging pain in the abdominal area.  You have persistent nausea, vomiting, or diarrhea.  You have a bad smelling vaginal discharge.  You have pain with urination. SEEK IMMEDIATE MEDICAL CARE IF:   You have a fever.  You are leaking fluid from your vagina.  You have spotting or bleeding from your vagina.  You have severe abdominal cramping or pain.  You have rapid weight gain or loss.  You have shortness of breath with chest pain.  You notice sudden or extreme swelling of your face, hands, ankles, feet, or legs.  You have not felt your baby move in over an hour.  You have severe headaches that do not go away with medicine.  You have vision changes. Document Released: 04/12/2001 Document Revised: 12/19/2012 Document Reviewed: 06/19/2012 [employer] Patient Information 2014 [employer].  

## 2013-05-27 ENCOUNTER — Ambulatory Visit (INDEPENDENT_AMBULATORY_CARE_PROVIDER_SITE_OTHER): Payer: BC Managed Care – PPO | Admitting: *Deleted

## 2013-05-27 VITALS — BP 112/67 | HR 89 | Temp 98.0°F | Ht 60.0 in | Wt 160.0 lb

## 2013-05-27 DIAGNOSIS — O09219 Supervision of pregnancy with history of pre-term labor, unspecified trimester: Secondary | ICD-10-CM

## 2013-06-02 ENCOUNTER — Inpatient Hospital Stay (HOSPITAL_COMMUNITY)
Admission: AD | Admit: 2013-06-02 | Discharge: 2013-06-02 | Disposition: A | Discharge status: Home / Self Care | Payer: BC Managed Care – PPO | Source: Ambulatory Visit | Attending: Obstetrics & Gynecology | Admitting: Obstetrics & Gynecology

## 2013-06-02 ENCOUNTER — Encounter (HOSPITAL_COMMUNITY): Payer: Self-pay

## 2013-06-02 DIAGNOSIS — O210 Mild hyperemesis gravidarum: Secondary | ICD-10-CM | POA: Insufficient documentation

## 2013-06-02 DIAGNOSIS — R112 Nausea with vomiting, unspecified: Secondary | ICD-10-CM

## 2013-06-02 DIAGNOSIS — R109 Unspecified abdominal pain: Secondary | ICD-10-CM | POA: Insufficient documentation

## 2013-06-02 LAB — URINALYSIS, ROUTINE W REFLEX MICROSCOPIC
Bilirubin Urine: NEGATIVE
Glucose, UA: NEGATIVE mg/dL
HGB URINE DIPSTICK: NEGATIVE
Ketones, ur: 15 mg/dL — AB
LEUKOCYTES UA: NEGATIVE
NITRITE: NEGATIVE
PROTEIN: 30 mg/dL — AB
Specific Gravity, Urine: 1.015 (ref 1.005–1.030)
UROBILINOGEN UA: 0.2 mg/dL (ref 0.0–1.0)
pH: 8 (ref 5.0–8.0)

## 2013-06-02 LAB — URINE MICROSCOPIC-ADD ON

## 2013-06-02 MED ORDER — ONDANSETRON 8 MG PO TBDP
8.0000 mg | ORAL_TABLET | Freq: Once | ORAL | Status: AC
  Administered 2013-06-02: 8 mg via ORAL
  Filled 2013-06-02: qty 1

## 2013-06-02 NOTE — Discharge Instructions (Signed)
Advance liquids as tolerated and slowly add solid foods. Avoid fried foods and other high fat foods. Call your doctor tomorrow if you continue to have problems.

## 2013-06-02 NOTE — MAU Provider Note (Signed)
History     CSN: [id number]  Arrival date and time: 06/02/13 1026   First Provider Initiated Contact with Patient 06/02/13 1135      Chief Complaint  Patient presents with  . Emesis  . Diarrhea   HPI Sherri Castaneda 28 y.o. [redacted]w[redacted]d   Comes to MAU with vomiting on yesterday.  None today but has not had anything to eat or drink today.  No diarrhea yesterday.  Earlier today was having upper abdominal pain but that has eased.  Thinks she could try some liquids since the abdominal pain has resolved.  OB History   Grav Para Term Preterm Abortions TAB SAB Ect Mult Living   4 3 1 2      3       Past Medical History  Diagnosis Date  . Seasonal allergies   . Preterm labor     Delivery at 32 weeks and 34 weeks.    Past Surgical History  Procedure Laterality Date  . Gynecologic cryosurgery      Family History  Problem Relation Age of Onset  . Cancer Mother   . Cancer Maternal Grandmother   . Cancer Father     History  Substance Use Topics  . Smoking status: Never Smoker   . Smokeless tobacco: Not on file  . Alcohol Use: No    Allergies:  Allergies  Allergen Reactions  . Flagyl [Metronidazole] Swelling  . Pineapple Itching and Swelling  . Latex Itching and Rash    Facility-administered medications prior to admission  Medication Dose Route Frequency Provider Last Rate Last Dose  . hydroxyprogesterone caproate (DELALUTIN) 250 mg/mL injection 250 mg  250 mg Intramuscular Weekly Lahoma Crocker, MD   250 mg at 05/27/13 1538   Prescriptions prior to admission  Medication Sig Dispense Refill  . acetaminophen (TYLENOL) 500 MG tablet Take 500 mg by mouth daily as needed for moderate pain.      Marland Kitchen ondansetron (ZOFRAN) 4 MG tablet Take 1 tablet (4 mg total) by mouth every 8 (eight) hours as needed for nausea.  20 tablet  0  . Prenat-FeCbn-FeAspGl-FA-Omega (OB COMPLETE PETITE) 35-5-1-200 MG CAPS Take 1 capsule by mouth daily.  30 capsule  11  . ranitidine (ZANTAC) 150 MG tablet  Take 150 mg by mouth daily as needed for heartburn.        Review of Systems  Constitutional: Negative for fever.  Gastrointestinal: Positive for nausea, vomiting and abdominal pain. Negative for diarrhea.  Genitourinary: Negative for dysuria.   Physical Exam   Blood pressure 121/63, pulse 93, temperature 98.5 F (36.9 C), temperature source Oral, resp. rate 18, last menstrual period 01/06/2013.  Physical Exam  Nursing note and vitals reviewed. Constitutional: She is oriented to person, place, and time. She appears well-developed and well-nourished.  HENT:  Head: Normocephalic.  Eyes: EOM are normal.  Neck: Neck supple.  Respiratory: Breath sounds normal.  GI: Soft. Bowel sounds are normal. She exhibits no distension. There is no tenderness. There is no rebound and no guarding.  FHT 160 by doppler  Musculoskeletal: Normal range of motion.  Neurological: She is alert and oriented to person, place, and time.  Skin: Skin is warm and dry.  Psychiatric: She has a normal mood and affect.    MAU Course  Procedures  MDM   Assessment and Plan  Vomiting  Plan Zofran 8 mg ODT in MAU Able to take PO fluids after Zofran.   No further problems with abdominal pain or vomiting. Given  instructions on BRAT diet.  Sherri Castaneda 06/02/2013, 11:45 AM

## 2013-06-02 NOTE — MAU Note (Signed)
Pt presents complaining of vomiting that started Saturday afternoon. Complains of sharp pains in lower abdomen. Denies vaginal bleeding and discharge.

## 2013-06-03 ENCOUNTER — Ambulatory Visit: Payer: BC Managed Care – PPO

## 2013-06-05 ENCOUNTER — Ambulatory Visit (INDEPENDENT_AMBULATORY_CARE_PROVIDER_SITE_OTHER): Payer: BC Managed Care – PPO | Admitting: *Deleted

## 2013-06-05 VITALS — BP 108/69 | HR 84 | Temp 98.3°F | Wt 158.0 lb

## 2013-06-05 DIAGNOSIS — O09219 Supervision of pregnancy with history of pre-term labor, unspecified trimester: Secondary | ICD-10-CM

## 2013-06-05 NOTE — Progress Notes (Signed)
Pt is in office today for 17p injection. Pt tolerated well.

## 2013-06-10 ENCOUNTER — Encounter: Payer: BC Managed Care – PPO | Admitting: Obstetrics & Gynecology

## 2013-06-10 ENCOUNTER — Ambulatory Visit (INDEPENDENT_AMBULATORY_CARE_PROVIDER_SITE_OTHER): Payer: BC Managed Care – PPO | Admitting: Obstetrics & Gynecology

## 2013-06-10 VITALS — BP 113/67 | Temp 98.8°F | Wt 160.0 lb

## 2013-06-10 DIAGNOSIS — Z3480 Encounter for supervision of other normal pregnancy, unspecified trimester: Secondary | ICD-10-CM

## 2013-06-10 LAB — POCT URINALYSIS DIPSTICK
Bilirubin, UA: NEGATIVE
Blood, UA: NEGATIVE
Glucose, UA: NEGATIVE
KETONES UA: NEGATIVE
Leukocytes, UA: NEGATIVE
Nitrite, UA: NEGATIVE
PH UA: 8
Protein, UA: NEGATIVE
SPEC GRAV UA: 1.01
Urobilinogen, UA: NEGATIVE

## 2013-06-10 NOTE — Patient Instructions (Signed)
Second Trimester of Pregnancy The second trimester is from week 13 through week 28, months 4 through 6. The second trimester is often a time when you feel your best. Your body has also adjusted to being pregnant, and you begin to feel better physically. Usually, morning sickness has lessened or quit completely, you may have more energy, and you may have an increase in appetite. The second trimester is also a time when the fetus is growing rapidly. At the end of the sixth month, the fetus is about 9 inches long and weighs about 1 pounds. You will likely begin to feel the baby move (quickening) between 18 and 20 weeks of the pregnancy. BODY CHANGES Your body goes through many changes during pregnancy. The changes vary from woman to woman.   Your weight will continue to increase. You will notice your lower abdomen bulging out.  You may begin to get stretch marks on your hips, abdomen, and breasts.  You may develop headaches that can be relieved by medicines approved by your caregiver.  You may urinate more often because the fetus is pressing on your bladder.  You may develop or continue to have heartburn as a result of your pregnancy.  You may develop constipation because certain hormones are causing the muscles that push waste through your intestines to slow down.  You may develop hemorrhoids or swollen, bulging veins (varicose veins).  You may have back pain because of the weight gain and pregnancy hormones relaxing your joints between the bones in your pelvis and as a result of a shift in weight and the muscles that support your balance.  Your breasts will continue to grow and be tender.  Your gums may bleed and may be sensitive to brushing and flossing.  Dark spots or blotches (chloasma, mask of pregnancy) may develop on your face. This will likely fade after the baby is born.  A dark line from your belly button to the pubic area (linea nigra) may appear. This will likely fade after the  baby is born. WHAT TO EXPECT AT YOUR PRENATAL VISITS During a routine prenatal visit:  You will be weighed to make sure you and the fetus are growing normally.  Your blood pressure will be taken.  Your abdomen will be measured to track your baby's growth.  The fetal heartbeat will be listened to.  Any test results from the previous visit will be discussed. Your caregiver may ask you:  How you are feeling.  If you are feeling the baby move.  If you have had any abnormal symptoms, such as leaking fluid, bleeding, severe headaches, or abdominal cramping.  If you have any questions. Other tests that may be performed during your second trimester include:  Blood tests that check for:  Low iron levels (anemia).  Gestational diabetes (between 24 and 28 weeks).  Rh antibodies.  Urine tests to check for infections, diabetes, or protein in the urine.  An ultrasound to confirm the proper growth and development of the baby.  An amniocentesis to check for possible genetic problems.  Fetal screens for spina bifida and Down syndrome. HOME CARE INSTRUCTIONS   Avoid all smoking, herbs, alcohol, and unprescribed drugs. These chemicals affect the formation and growth of the baby.  Follow your caregiver's instructions regarding medicine use. There are medicines that are either safe or unsafe to take during pregnancy.  Exercise only as directed by your caregiver. Experiencing uterine cramps is a good sign to stop exercising.  Continue to eat regular,   healthy meals.  Wear a good support bra for breast tenderness.  Do not use hot tubs, steam rooms, or saunas.  Wear your seat belt at all times when driving.  Avoid raw meat, uncooked cheese, cat litter boxes, and soil used by cats. These carry germs that can cause birth defects in the baby.  Take your prenatal vitamins.  Try taking a stool softener (if your caregiver approves) if you develop constipation. Eat more high-fiber foods,  such as fresh vegetables or fruit and whole grains. Drink plenty of fluids to keep your urine clear or pale yellow.  Take warm sitz baths to soothe any pain or discomfort caused by hemorrhoids. Use hemorrhoid cream if your caregiver approves.  If you develop varicose veins, wear support hose. Elevate your feet for 15 minutes, 3 4 times a day. Limit salt in your diet.  Avoid heavy lifting, wear low heel shoes, and practice good posture.  Rest with your legs elevated if you have leg cramps or low back pain.  Visit your dentist if you have not gone yet during your pregnancy. Use a soft toothbrush to brush your teeth and be gentle when you floss.  A sexual relationship may be continued unless your caregiver directs you otherwise.  Continue to go to all your prenatal visits as directed by your caregiver. SEEK MEDICAL CARE IF:   You have dizziness.  You have mild pelvic cramps, pelvic pressure, or nagging pain in the abdominal area.  You have persistent nausea, vomiting, or diarrhea.  You have a bad smelling vaginal discharge.  You have pain with urination. SEEK IMMEDIATE MEDICAL CARE IF:   You have a fever.  You are leaking fluid from your vagina.  You have spotting or bleeding from your vagina.  You have severe abdominal cramping or pain.  You have rapid weight gain or loss.  You have shortness of breath with chest pain.  You notice sudden or extreme swelling of your face, hands, ankles, feet, or legs.  You have not felt your baby move in over an hour.  You have severe headaches that do not go away with medicine.  You have vision changes. Document Released: 04/12/2001 Document Revised: 12/19/2012 Document Reviewed: 06/19/2012 [employer] Patient Information 2014 [employer].  

## 2013-06-10 NOTE — Progress Notes (Signed)
Pulse- 105 Doing well.

## 2013-06-11 ENCOUNTER — Encounter: Payer: Self-pay | Admitting: Obstetrics & Gynecology

## 2013-06-11 ENCOUNTER — Ambulatory Visit (INDEPENDENT_AMBULATORY_CARE_PROVIDER_SITE_OTHER): Payer: BC Managed Care – PPO | Admitting: *Deleted

## 2013-06-11 ENCOUNTER — Other Ambulatory Visit: Payer: Self-pay | Admitting: *Deleted

## 2013-06-11 ENCOUNTER — Ambulatory Visit (INDEPENDENT_AMBULATORY_CARE_PROVIDER_SITE_OTHER): Payer: BC Managed Care – PPO

## 2013-06-11 DIAGNOSIS — O365990 Maternal care for other known or suspected poor fetal growth, unspecified trimester, not applicable or unspecified: Secondary | ICD-10-CM

## 2013-06-11 DIAGNOSIS — O09219 Supervision of pregnancy with history of pre-term labor, unspecified trimester: Secondary | ICD-10-CM

## 2013-06-11 DIAGNOSIS — O219 Vomiting of pregnancy, unspecified: Secondary | ICD-10-CM

## 2013-06-11 LAB — US OB DETAIL + 14 WK

## 2013-06-11 MED ORDER — ONDANSETRON HCL 4 MG PO TABS: 4.0000 mg | ORAL_TABLET | Freq: Three times a day (TID) | ORAL | Status: DC | PRN

## 2013-06-17 ENCOUNTER — Ambulatory Visit: Payer: BC Managed Care – PPO

## 2013-06-17 ENCOUNTER — Ambulatory Visit (INDEPENDENT_AMBULATORY_CARE_PROVIDER_SITE_OTHER): Payer: BC Managed Care – PPO | Admitting: *Deleted

## 2013-06-17 VITALS — BP 115/80 | HR 96 | Temp 98.3°F | Wt 162.0 lb

## 2013-06-17 DIAGNOSIS — O09219 Supervision of pregnancy with history of pre-term labor, unspecified trimester: Secondary | ICD-10-CM

## 2013-06-17 NOTE — Progress Notes (Signed)
Patient in office for 17P injection. Patien tolerated injection well.

## 2013-06-24 ENCOUNTER — Ambulatory Visit (INDEPENDENT_AMBULATORY_CARE_PROVIDER_SITE_OTHER): Payer: BC Managed Care – PPO | Admitting: Obstetrics & Gynecology

## 2013-06-24 VITALS — BP 126/70 | Temp 98.1°F | Wt 163.0 lb

## 2013-06-24 DIAGNOSIS — O09219 Supervision of pregnancy with history of pre-term labor, unspecified trimester: Secondary | ICD-10-CM

## 2013-06-24 DIAGNOSIS — Z3480 Encounter for supervision of other normal pregnancy, unspecified trimester: Secondary | ICD-10-CM

## 2013-06-24 MED ORDER — PANTOPRAZOLE SODIUM 40 MG IV SOLR: 40.0000 mg | Freq: Every day | INTRAVENOUS | Status: DC

## 2013-06-24 NOTE — Progress Notes (Signed)
Pulse- 101 Doing well.

## 2013-06-25 LAB — GLUCOSE, RANDOM: Glucose, Bld: 96 mg/dL (ref 70–99)

## 2013-06-25 NOTE — Patient Instructions (Signed)
Second Trimester of Pregnancy The second trimester is from week 13 through week 28, months 4 through 6. The second trimester is often a time when you feel your best. Your body has also adjusted to being pregnant, and you begin to feel better physically. Usually, morning sickness has lessened or quit completely, you may have more energy, and you may have an increase in appetite. The second trimester is also a time when the fetus is growing rapidly. At the end of the sixth month, the fetus is about 9 inches long and weighs about 1 pounds. You will likely begin to feel the baby move (quickening) between 18 and 20 weeks of the pregnancy. BODY CHANGES Your body goes through many changes during pregnancy. The changes vary from woman to woman.   Your weight will continue to increase. You will notice your lower abdomen bulging out.  You may begin to get stretch marks on your hips, abdomen, and breasts.  You may develop headaches that can be relieved by medicines approved by your caregiver.  You may urinate more often because the fetus is pressing on your bladder.  You may develop or continue to have heartburn as a result of your pregnancy.  You may develop constipation because certain hormones are causing the muscles that push waste through your intestines to slow down.  You may develop hemorrhoids or swollen, bulging veins (varicose veins).  You may have back pain because of the weight gain and pregnancy hormones relaxing your joints between the bones in your pelvis and as a result of a shift in weight and the muscles that support your balance.  Your breasts will continue to grow and be tender.  Your gums may bleed and may be sensitive to brushing and flossing.  Dark spots or blotches (chloasma, mask of pregnancy) may develop on your face. This will likely fade after the baby is born.  A dark line from your belly button to the pubic area (linea nigra) may appear. This will likely fade after the  baby is born. WHAT TO EXPECT AT YOUR PRENATAL VISITS During a routine prenatal visit:  You will be weighed to make sure you and the fetus are growing normally.  Your blood pressure will be taken.  Your abdomen will be measured to track your baby's growth.  The fetal heartbeat will be listened to.  Any test results from the previous visit will be discussed. Your caregiver may ask you:  How you are feeling.  If you are feeling the baby move.  If you have had any abnormal symptoms, such as leaking fluid, bleeding, severe headaches, or abdominal cramping.  If you have any questions. Other tests that may be performed during your second trimester include:  Blood tests that check for:  Low iron levels (anemia).  Gestational diabetes (between 24 and 28 weeks).  Rh antibodies.  Urine tests to check for infections, diabetes, or protein in the urine.  An ultrasound to confirm the proper growth and development of the baby.  An amniocentesis to check for possible genetic problems.  Fetal screens for spina bifida and Down syndrome. HOME CARE INSTRUCTIONS   Avoid all smoking, herbs, alcohol, and unprescribed drugs. These chemicals affect the formation and growth of the baby.  Follow your caregiver's instructions regarding medicine use. There are medicines that are either safe or unsafe to take during pregnancy.  Exercise only as directed by your caregiver. Experiencing uterine cramps is a good sign to stop exercising.  Continue to eat regular,   healthy meals.  Wear a good support bra for breast tenderness.  Do not use hot tubs, steam rooms, or saunas.  Wear your seat belt at all times when driving.  Avoid raw meat, uncooked cheese, cat litter boxes, and soil used by cats. These carry germs that can cause birth defects in the baby.  Take your prenatal vitamins.  Try taking a stool softener (if your caregiver approves) if you develop constipation. Eat more high-fiber foods,  such as fresh vegetables or fruit and whole grains. Drink plenty of fluids to keep your urine clear or pale yellow.  Take warm sitz baths to soothe any pain or discomfort caused by hemorrhoids. Use hemorrhoid cream if your caregiver approves.  If you develop varicose veins, wear support hose. Elevate your feet for 15 minutes, 3 4 times a day. Limit salt in your diet.  Avoid heavy lifting, wear low heel shoes, and practice good posture.  Rest with your legs elevated if you have leg cramps or low back pain.  Visit your dentist if you have not gone yet during your pregnancy. Use a soft toothbrush to brush your teeth and be gentle when you floss.  A sexual relationship may be continued unless your caregiver directs you otherwise.  Continue to go to all your prenatal visits as directed by your caregiver. SEEK MEDICAL CARE IF:   You have dizziness.  You have mild pelvic cramps, pelvic pressure, or nagging pain in the abdominal area.  You have persistent nausea, vomiting, or diarrhea.  You have a bad smelling vaginal discharge.  You have pain with urination. SEEK IMMEDIATE MEDICAL CARE IF:   You have a fever.  You are leaking fluid from your vagina.  You have spotting or bleeding from your vagina.  You have severe abdominal cramping or pain.  You have rapid weight gain or loss.  You have shortness of breath with chest pain.  You notice sudden or extreme swelling of your face, hands, ankles, feet, or legs.  You have not felt your baby move in over an hour.  You have severe headaches that do not go away with medicine.  You have vision changes. Document Released: 04/12/2001 Document Revised: 12/19/2012 Document Reviewed: 06/19/2012 [employer] Patient Information 2014 [employer].  

## 2013-06-26 ENCOUNTER — Other Ambulatory Visit: Payer: Self-pay | Admitting: *Deleted

## 2013-06-26 DIAGNOSIS — K219 Gastro-esophageal reflux disease without esophagitis: Secondary | ICD-10-CM

## 2013-06-26 MED ORDER — PANTOPRAZOLE SODIUM 40 MG PO TBEC: 40.0000 mg | DELAYED_RELEASE_TABLET | Freq: Every day | ORAL | Status: DC

## 2013-07-01 ENCOUNTER — Ambulatory Visit (INDEPENDENT_AMBULATORY_CARE_PROVIDER_SITE_OTHER): Payer: BC Managed Care – PPO | Admitting: *Deleted

## 2013-07-01 VITALS — BP 114/78 | HR 100 | Temp 98.2°F | Wt 159.0 lb

## 2013-07-01 DIAGNOSIS — O09219 Supervision of pregnancy with history of pre-term labor, unspecified trimester: Secondary | ICD-10-CM

## 2013-07-01 NOTE — Progress Notes (Signed)
Patient in office for her weekly 17-P injection. Patient tolerated injection well.

## 2013-07-08 ENCOUNTER — Ambulatory Visit (INDEPENDENT_AMBULATORY_CARE_PROVIDER_SITE_OTHER): Payer: BC Managed Care – PPO | Admitting: Obstetrics & Gynecology

## 2013-07-08 VITALS — BP 99/64 | Wt 163.0 lb

## 2013-07-08 DIAGNOSIS — R81 Glycosuria: Secondary | ICD-10-CM

## 2013-07-08 DIAGNOSIS — Z3480 Encounter for supervision of other normal pregnancy, unspecified trimester: Secondary | ICD-10-CM

## 2013-07-08 DIAGNOSIS — O09219 Supervision of pregnancy with history of pre-term labor, unspecified trimester: Secondary | ICD-10-CM

## 2013-07-08 LAB — POCT URINALYSIS DIPSTICK
BILIRUBIN UA: NEGATIVE
GLUCOSE UA: NEGATIVE
Ketones, UA: NEGATIVE
LEUKOCYTES UA: NEGATIVE
Nitrite, UA: NEGATIVE
RBC UA: NEGATIVE
Spec Grav, UA: 1.02
Urobilinogen, UA: NEGATIVE
pH, UA: 5

## 2013-07-08 LAB — GLUCOSE, POCT (MANUAL RESULT ENTRY): POC Glucose: 112 mg/dl — AB (ref 70–99)

## 2013-07-08 NOTE — Progress Notes (Signed)
Pulse: 90  Patient denies any concerns.

## 2013-07-15 ENCOUNTER — Ambulatory Visit (INDEPENDENT_AMBULATORY_CARE_PROVIDER_SITE_OTHER): Payer: BC Managed Care – PPO | Admitting: *Deleted

## 2013-07-15 VITALS — BP 124/72 | HR 111 | Temp 98.0°F | Wt 166.0 lb

## 2013-07-15 DIAGNOSIS — O09219 Supervision of pregnancy with history of pre-term labor, unspecified trimester: Secondary | ICD-10-CM

## 2013-07-15 NOTE — Progress Notes (Signed)
Pt in office today for a 17-P injection.  Patient tolerated Injection well.

## 2013-07-22 ENCOUNTER — Ambulatory Visit (INDEPENDENT_AMBULATORY_CARE_PROVIDER_SITE_OTHER): Payer: BC Managed Care – PPO | Admitting: Obstetrics & Gynecology

## 2013-07-22 VITALS — BP 105/70 | Temp 99.1°F | Wt 165.0 lb

## 2013-07-22 DIAGNOSIS — O09219 Supervision of pregnancy with history of pre-term labor, unspecified trimester: Secondary | ICD-10-CM

## 2013-07-22 DIAGNOSIS — Z3480 Encounter for supervision of other normal pregnancy, unspecified trimester: Secondary | ICD-10-CM

## 2013-07-22 LAB — POCT URINALYSIS DIPSTICK
Bilirubin, UA: NEGATIVE
Blood, UA: NEGATIVE
Glucose, UA: 50
KETONES UA: NEGATIVE
LEUKOCYTES UA: NEGATIVE
NITRITE UA: NEGATIVE
Spec Grav, UA: 1.02
UROBILINOGEN UA: NEGATIVE
pH, UA: 6

## 2013-07-22 NOTE — Progress Notes (Signed)
Pulse: 101 Patient denies any concerns.  Patient also in the office today for her 17P Injection. Injection given in left upper outer quadrant. Patient tolerated well. Patient to return in one week for next 17P Injection.

## 2013-07-23 NOTE — Patient Instructions (Signed)

## 2013-07-29 ENCOUNTER — Ambulatory Visit (INDEPENDENT_AMBULATORY_CARE_PROVIDER_SITE_OTHER): Payer: BC Managed Care – PPO | Admitting: *Deleted

## 2013-07-29 DIAGNOSIS — O09219 Supervision of pregnancy with history of pre-term labor, unspecified trimester: Secondary | ICD-10-CM

## 2013-07-29 DIAGNOSIS — Z3049 Encounter for surveillance of other contraceptives: Secondary | ICD-10-CM

## 2013-07-29 NOTE — Progress Notes (Signed)
Patient in office today for a 17- P injection.

## 2013-08-06 ENCOUNTER — Encounter: Payer: BC Managed Care – PPO | Admitting: Advanced Practice Midwife

## 2013-08-07 ENCOUNTER — Ambulatory Visit (INDEPENDENT_AMBULATORY_CARE_PROVIDER_SITE_OTHER): Payer: BC Managed Care – PPO | Admitting: Obstetrics

## 2013-08-07 VITALS — BP 113/66 | Temp 98.2°F

## 2013-08-07 DIAGNOSIS — Z3480 Encounter for supervision of other normal pregnancy, unspecified trimester: Secondary | ICD-10-CM

## 2013-08-07 LAB — POCT URINALYSIS DIPSTICK
Bilirubin, UA: NEGATIVE
Glucose, UA: NEGATIVE
Ketones, UA: NEGATIVE
Leukocytes, UA: NEGATIVE
NITRITE UA: NEGATIVE
PH UA: 5
RBC UA: NEGATIVE
Spec Grav, UA: 1.015
UROBILINOGEN UA: NEGATIVE

## 2013-08-07 NOTE — Progress Notes (Signed)
Pulse 97 Pt statest that she is having some lower pelvic pressure since last visit. 17P given at today's visit.

## 2013-08-08 ENCOUNTER — Encounter: Payer: BC Managed Care – PPO | Admitting: Obstetrics

## 2013-08-12 ENCOUNTER — Ambulatory Visit (INDEPENDENT_AMBULATORY_CARE_PROVIDER_SITE_OTHER): Payer: BC Managed Care – PPO | Admitting: *Deleted

## 2013-08-12 DIAGNOSIS — O09219 Supervision of pregnancy with history of pre-term labor, unspecified trimester: Secondary | ICD-10-CM

## 2013-08-12 DIAGNOSIS — Z3480 Encounter for supervision of other normal pregnancy, unspecified trimester: Secondary | ICD-10-CM

## 2013-08-12 NOTE — Progress Notes (Signed)
Pt is in office today for her 17P injection.  Injection given in right upper outer quadrant.  Pt tolerated well.

## 2013-08-19 ENCOUNTER — Encounter: Payer: Self-pay | Admitting: Obstetrics & Gynecology

## 2013-08-19 ENCOUNTER — Encounter: Payer: Self-pay | Admitting: Obstetrics

## 2013-08-19 ENCOUNTER — Ambulatory Visit (INDEPENDENT_AMBULATORY_CARE_PROVIDER_SITE_OTHER): Payer: BC Managed Care – PPO | Admitting: Obstetrics & Gynecology

## 2013-08-19 VITALS — BP 121/71 | HR 93 | Temp 98.5°F | Wt 170.0 lb

## 2013-08-19 DIAGNOSIS — Z3480 Encounter for supervision of other normal pregnancy, unspecified trimester: Secondary | ICD-10-CM

## 2013-08-19 LAB — POCT URINALYSIS DIPSTICK
Bilirubin, UA: NEGATIVE
Glucose, UA: NEGATIVE
Ketones, UA: NEGATIVE
Leukocytes, UA: NEGATIVE
NITRITE UA: NEGATIVE
PH UA: 5
RBC UA: NEGATIVE
SPEC GRAV UA: 1.02
Urobilinogen, UA: NEGATIVE

## 2013-08-19 NOTE — Progress Notes (Signed)
Subjective:    Sherri Castaneda is a 28 y.o. female being seen today for her obstetrical visit. She is at [redacted]w[redacted]d gestation. Patient reports no complaints. Fetal movement: normal.   Past Medical History  Diagnosis Date  . Seasonal allergies   . Preterm labor     Delivery at 32 weeks and 34 weeks.    Past Surgical History  Procedure Laterality Date  . Gynecologic cryosurgery      Current outpatient prescriptions:acetaminophen (TYLENOL) 500 MG tablet, Take 500 mg by mouth daily as needed for moderate pain., Disp: , Rfl: ;  ondansetron (ZOFRAN) 4 MG tablet, Take 1 tablet (4 mg total) by mouth every 8 (eight) hours as needed for nausea., Disp: 20 tablet, Rfl: 1;  pantoprazole (PROTONIX) 40 MG tablet, Take 1 tablet (40 mg total) by mouth daily., Disp: 30 tablet, Rfl: 3 Prenat-FeCbn-FeAspGl-FA-Omega (OB COMPLETE PETITE) 35-5-1-200 MG CAPS, Take 1 capsule by mouth daily., Disp: 30 capsule, Rfl: 11 Current facility-administered medications:hydroxyprogesterone caproate (DELALUTIN) 250 mg/mL injection 250 mg, 250 mg, Intramuscular, Weekly, Lahoma Crocker, MD, 250 mg at 08/19/13 1606 Allergies  Allergen Reactions  . Flagyl [Metronidazole] Swelling  . Pineapple Itching and Swelling  . Latex Itching and Rash    History  Substance Use Topics  . Smoking status: Never Smoker   . Smokeless tobacco: Not on file  . Alcohol Use: No    Family History  Problem Relation Age of Onset  . Cancer Mother   . Cancer Maternal Grandmother   . Cancer Father      Review of Systems Constitutional: negative for anorexia Gastrointestinal: negative for abdominal pain Genitourinary:negative for vaginal discharge Musculoskeletal:negative for back pain Behavioral/Psych: negative for depression and tobacco use   Objective:    BP 121/71  Pulse 93  Temp(Src) 98.5 F (36.9 C)  Wt 77.111 kg (170 lb)  LMP 01/06/2013 FHT:  140 BPM  Uterine Size: size equals dates  Presentation: cephalic     Assessment:    Pregnancy @ [redacted]w[redacted]d weeks   Plan:     labs reviewed, problem list updated Pediatrician: discussed. Infant feeding: plans to breastfeed. Maternity leave: forms done. Cigarette smoking: never smoked. Orders Placed This Encounter  Procedures  . POCT urinalysis dipstick  Partner plans vasectomy  Follow up in 2 Weeks.

## 2013-08-20 NOTE — Patient Instructions (Signed)
Third Trimester of Pregnancy The third trimester is from week 29 through week 42, months 7 through 9. The third trimester is a time when the fetus is growing rapidly. At the end of the ninth month, the fetus is about 20 inches in length and weighs 6 10 pounds.  BODY CHANGES Your body goes through many changes during pregnancy. The changes vary from woman to woman.   Your weight will continue to increase. You can expect to gain 25 35 pounds (11 16 kg) by the end of the pregnancy.  You may begin to get stretch marks on your hips, abdomen, and breasts.  You may urinate more often because the fetus is moving lower into your pelvis and pressing on your bladder.  You may develop or continue to have heartburn as a result of your pregnancy.  You may develop constipation because certain hormones are causing the muscles that push waste through your intestines to slow down.  You may develop hemorrhoids or swollen, bulging veins (varicose veins).  You may have pelvic pain because of the weight gain and pregnancy hormones relaxing your joints between the bones in your pelvis. Back aches may result from over exertion of the muscles supporting your posture.  Your breasts will continue to grow and be tender. A yellow discharge may leak from your breasts called colostrum.  Your belly button may stick out.  You may feel short of breath because of your expanding uterus.  You may notice the fetus "dropping," or moving lower in your abdomen.  You may have a bloody mucus discharge. This usually occurs a few days to a week before labor begins.  Your cervix becomes thin and soft (effaced) near your due date. WHAT TO EXPECT AT YOUR PRENATAL EXAMS  You will have prenatal exams every 2 weeks until week 36. Then, you will have weekly prenatal exams. During a routine prenatal visit:  You will be weighed to make sure you and the fetus are growing normally.  Your blood pressure is taken.  Your abdomen will  be measured to track your baby's growth.  The fetal heartbeat will be listened to.  Any test results from the previous visit will be discussed.  You may have a cervical check near your due date to see if you have effaced. At around 36 weeks, your caregiver will check your cervix. At the same time, your caregiver will also perform a test on the secretions of the vaginal tissue. This test is to determine if a type of bacteria, Group B streptococcus, is present. Your caregiver will explain this further. Your caregiver may ask you:  What your birth plan is.  How you are feeling.  If you are feeling the baby move.  If you have had any abnormal symptoms, such as leaking fluid, bleeding, severe headaches, or abdominal cramping.  If you have any questions. Other tests or screenings that may be performed during your third trimester include:  Blood tests that check for low iron levels (anemia).  Fetal testing to check the health, activity level, and growth of the fetus. Testing is done if you have certain medical conditions or if there are problems during the pregnancy. FALSE LABOR You may feel small, irregular contractions that eventually go away. These are called Braxton Hicks contractions, or false labor. Contractions may last for hours, days, or even weeks before true labor sets in. If contractions come at regular intervals, intensify, or become painful, it is best to be seen by your caregiver.    SIGNS OF LABOR   Menstrual-like cramps.  Contractions that are 5 minutes apart or less.  Contractions that start on the top of the uterus and spread down to the lower abdomen and back.  A sense of increased pelvic pressure or back pain.  A watery or bloody mucus discharge that comes from the vagina. If you have any of these signs before the 37th week of pregnancy, call your caregiver right away. You need to go to the hospital to get checked immediately. HOME CARE INSTRUCTIONS   Avoid all  smoking, herbs, alcohol, and unprescribed drugs. These chemicals affect the formation and growth of the baby.  Follow your caregiver's instructions regarding medicine use. There are medicines that are either safe or unsafe to take during pregnancy.  Exercise only as directed by your caregiver. Experiencing uterine cramps is a good sign to stop exercising.  Continue to eat regular, healthy meals.  Wear a good support bra for breast tenderness.  Do not use hot tubs, steam rooms, or saunas.  Wear your seat belt at all times when driving.  Avoid raw meat, uncooked cheese, cat litter boxes, and soil used by cats. These carry germs that can cause birth defects in the baby.  Take your prenatal vitamins.  Try taking a stool softener (if your caregiver approves) if you develop constipation. Eat more high-fiber foods, such as fresh vegetables or fruit and whole grains. Drink plenty of fluids to keep your urine clear or pale yellow.  Take warm sitz baths to soothe any pain or discomfort caused by hemorrhoids. Use hemorrhoid cream if your caregiver approves.  If you develop varicose veins, wear support hose. Elevate your feet for 15 minutes, 3 4 times a day. Limit salt in your diet.  Avoid heavy lifting, wear low heal shoes, and practice good posture.  Rest a lot with your legs elevated if you have leg cramps or low back pain.  Visit your dentist if you have not gone during your pregnancy. Use a soft toothbrush to brush your teeth and be gentle when you floss.  A sexual relationship may be continued unless your caregiver directs you otherwise.  Do not travel far distances unless it is absolutely necessary and only with the approval of your caregiver.  Take prenatal classes to understand, practice, and ask questions about the labor and delivery.  Make a trial run to the hospital.  Pack your hospital bag.  Prepare the baby's nursery.  Continue to go to all your prenatal visits as directed  by your caregiver. SEEK MEDICAL CARE IF:  You are unsure if you are in labor or if your water has broken.  You have dizziness.  You have mild pelvic cramps, pelvic pressure, or nagging pain in your abdominal area.  You have persistent nausea, vomiting, or diarrhea.  You have a bad smelling vaginal discharge.  You have pain with urination. SEEK IMMEDIATE MEDICAL CARE IF:   You have a fever.  You are leaking fluid from your vagina.  You have spotting or bleeding from your vagina.  You have severe abdominal cramping or pain.  You have rapid weight loss or gain.  You have shortness of breath with chest pain.  You notice sudden or extreme swelling of your face, hands, ankles, feet, or legs.  You have not felt your baby move in over an hour.  You have severe headaches that do not go away with medicine.  You have vision changes. Document Released: 04/12/2001 Document Revised: 12/19/2012 Document Reviewed:   You have severe abdominal cramping or pain.   You have rapid weight loss or gain.   You have shortness of breath with chest pain.   You notice sudden or extreme swelling of your face, hands, ankles, feet, or legs.   You have not felt your baby move in over an hour.   You have severe headaches that do not go away with medicine.   You have vision changes.  Document Released: 04/12/2001 Document Revised: 12/19/2012 Document Reviewed: 06/19/2012  [employer] Patient Information 2014 [employer].

## 2013-08-26 ENCOUNTER — Ambulatory Visit (INDEPENDENT_AMBULATORY_CARE_PROVIDER_SITE_OTHER): Payer: BC Managed Care – PPO | Admitting: *Deleted

## 2013-08-26 NOTE — Progress Notes (Signed)
Patient in the office today for a Makena injection. Patinet tolerated injection well.  LMP 01/06/2013

## 2013-09-02 ENCOUNTER — Encounter: Payer: Self-pay | Admitting: Obstetrics & Gynecology

## 2013-09-02 ENCOUNTER — Ambulatory Visit (INDEPENDENT_AMBULATORY_CARE_PROVIDER_SITE_OTHER): Payer: BC Managed Care – PPO | Admitting: Obstetrics & Gynecology

## 2013-09-02 ENCOUNTER — Encounter: Payer: BC Managed Care – PPO | Admitting: Obstetrics & Gynecology

## 2013-09-02 VITALS — BP 115/75 | HR 88 | Temp 97.2°F | Wt 168.0 lb

## 2013-09-02 DIAGNOSIS — Z3480 Encounter for supervision of other normal pregnancy, unspecified trimester: Secondary | ICD-10-CM

## 2013-09-02 LAB — POCT URINALYSIS DIPSTICK
Glucose, UA: NEGATIVE
Ketones, UA: NEGATIVE
Leukocytes, UA: NEGATIVE
NITRITE UA: NEGATIVE
RBC UA: NEGATIVE
SPEC GRAV UA: 1.015
pH, UA: 7

## 2013-09-02 NOTE — Progress Notes (Signed)
Subjective:    Sherri Castaneda is a 28 y.o. female being seen today for her obstetrical visit. She is at [redacted]w[redacted]d  gestation. Patient reports no complaints. Fetal movement: normal.   Past Medical History  Diagnosis Date  . Seasonal allergies   . Preterm labor     Delivery at 32 weeks and 34 weeks.    Past Surgical History  Procedure Laterality Date  . Gynecologic cryosurgery      Current outpatient prescriptions:acetaminophen (TYLENOL) 500 MG tablet, Take 500 mg by mouth daily as needed for moderate pain., Disp: , Rfl: ;  ondansetron (ZOFRAN) 4 MG tablet, Take 1 tablet (4 mg total) by mouth every 8 (eight) hours as needed for nausea., Disp: 20 tablet, Rfl: 1;  pantoprazole (PROTONIX) 40 MG tablet, Take 1 tablet (40 mg total) by mouth daily., Disp: 30 tablet, Rfl: 3 Prenat-FeCbn-FeAspGl-FA-Omega (OB COMPLETE PETITE) 35-5-1-200 MG CAPS, Take 1 capsule by mouth daily., Disp: 30 capsule, Rfl: 11 Current facility-administered medications:hydroxyprogesterone caproate (DELALUTIN) 250 mg/mL injection 250 mg, 250 mg, Intramuscular, Weekly, Lahoma Crocker, MD, 250 mg at 08/26/13 1615 Allergies  Allergen Reactions  . Flagyl [Metronidazole] Swelling  . Pineapple Itching and Swelling  . Latex Itching and Rash    History  Substance Use Topics  . Smoking status: Never Smoker   . Smokeless tobacco: Not on file  . Alcohol Use: No    Family History  Problem Relation Age of Onset  . Cancer Mother   . Cancer Maternal Grandmother   . Cancer Father      Review of Systems Constitutional: negative for anorexia Gastrointestinal: negative for abdominal pain Genitourinary:negative for vaginal discharge Musculoskeletal:negative for back pain Behavioral/Psych: negative for depression and tobacco use   Objective:    BP 115/75  Pulse 88  Temp(Src) 97.2 F (36.2 C)  Wt 76.204 kg (168 lb)  LMP 01/06/2013 FHT:  140 BPM  Uterine Size: size equals dates  Presentation: cephalic     Assessment:     Pregnancy @ [redacted]w[redacted]d  weeks   Plan:     labs reviewed, problem list updated  Orders Placed This Encounter  Procedures  . POCT urinalysis dipstick    Follow up in 1 Week

## 2013-09-03 NOTE — Patient Instructions (Signed)
Patient information: Group B streptococcus and pregnancy (Beyond the Basics)  Authors Karen M Puopolo, MD, PhD Carol J Baker, MD Section Editors Charles J Lockwood, MD Daniel J Sexton, MD Deputy Editor Vanessa A Barss, MD Disclosures  All topics are updated as new evidence becomes available and our peer review process is complete.  Literature review current through: Feb 2014.  This topic last updated: Oct 31, 2011.  INTRODUCTION - Group B streptococcus (GBS) is a bacterium that can cause serious infections in pregnant women and newborn babies. GBS is one of many types of streptococcal bacteria, sometimes called "strep." This article discusses GBS, its effect on pregnant women and infants, and ways to prevent complications of GBS. More detailed information about GBS is available by subscription. (See "Group B streptococcal infection in pregnant women".) WHAT IS GROUP B STREP INFECTION? - GBS is commonly found in the digestive system and the vagina. In healthy adults, GBS is not harmful and does not cause problems. But in pregnant women and newborn infants, being infected with GBS can cause serious illness. Approximately one in three to four pregnant women in the [place] carries GBS in their gastrointestinal system and/or in their vagina. Carrying GBS is not the same as being infected. Carriers are not sick and do not need treatment during pregnancy. There is no treatment that can stop you from carrying GBS.  Pregnant women who are carriers of GBS infrequently become infected with GBS. GBS can cause urinary tract infections, infection of the amniotic fluid (bag of water), and infection of the uterus after delivery. GBS infections during pregnancy may lead to preterm labor.  Pregnant women who carry GBS can pass on the bacteria to their newborns, and some of those babies become infected with GBS. Newborns who are infected with GBS can develop pneumonia (lung infection), septicemia (blood infection), or  meningitis (infection of the lining of the brain and spinal cord). These complications can be prevented by giving intravenous antibiotics during labor to any woman who is at risk of GBS infection. You are at risk of GBS infection if: You have a urine culture during your current pregnancy showing GBS  You have a vaginal and rectal culture during your current pregnancy showing GBS  You had an infant infected with GBS in the past GROUP B STREP PREVENTION - Most doctors and nurses recommend a urine culture early in your pregnancy to be sure that you do not have a bladder infection without symptoms. If you urine culture shows GBS or other bacteria, you may be treated with an antibiotic. If you have symptoms of urinary infection, such as pain with urination, any time during your pregnancy, a urine culture is done. If GBS grows from the urine culture, it should be treated with an antibiotic, and you should also receive intravenous antibiotics during labor. Expert groups recommend that all pregnant women have a GBS culture at 35 to 37 weeks of pregnancy. The culture is done by swabbing the vagina and rectum. If your GBS culture is positive, you will be given an intravenous antibiotic during labor. If you have preterm labor, the culture is done then and an intravenous antibiotic is given until the baby is born or the labor is stopped by your health care provider. If you have a positive GBS culture and you have an allergy to penicillin, be sure your doctor and nurse are aware of this allergy and tell them what happened with the allergy. If you had only a rash or itching, this   is not a serious allergy, and you can receive a common drug related to the penicillin. If you had a serious allergy (for example, trouble breathing, swelling of your face) you may need an additional test to determine which antibiotic should be used during labor. Being treated with an antibiotic during labor greatly reduces the chance that you or  your newborn will develop infections related to GBS. It is important to note that young infants up to age 3 months can also develop septicemia, meningitis and other serious infections from GBS. Being treated with an antibiotic during labor does not reduce the chance that your baby will develop this later type of infection. There is currently no known way of preventing this later-onset GBS disease. WHERE TO GET MORE INFORMATION - Your healthcare provider is the best source of information for questions and concerns related to your medical problem.  

## 2013-09-09 ENCOUNTER — Encounter: Payer: BC Managed Care – PPO | Admitting: Obstetrics & Gynecology

## 2013-09-16 ENCOUNTER — Ambulatory Visit (INDEPENDENT_AMBULATORY_CARE_PROVIDER_SITE_OTHER): Payer: BC Managed Care – PPO | Admitting: Obstetrics & Gynecology

## 2013-09-16 ENCOUNTER — Encounter: Payer: Self-pay | Admitting: Obstetrics & Gynecology

## 2013-09-16 VITALS — BP 113/69 | HR 98 | Temp 98.8°F | Wt 173.0 lb

## 2013-09-16 DIAGNOSIS — Z3480 Encounter for supervision of other normal pregnancy, unspecified trimester: Secondary | ICD-10-CM

## 2013-09-16 LAB — POCT URINALYSIS DIPSTICK
Bilirubin, UA: NEGATIVE
Blood, UA: NEGATIVE
Glucose, UA: NEGATIVE
Ketones, UA: NEGATIVE
LEUKOCYTES UA: NEGATIVE
Nitrite, UA: NEGATIVE
PH UA: 8
PROTEIN UA: NEGATIVE
Spec Grav, UA: 1.01
Urobilinogen, UA: NEGATIVE

## 2013-09-16 NOTE — Patient Instructions (Signed)
Patient information: Group B streptococcus and pregnancy (Beyond the Basics)  Authors Karen M Puopolo, MD, PhD Carol J Baker, MD Section Editors Charles J Lockwood, MD Daniel J Sexton, MD Deputy Editor Vanessa A Barss, MD Disclosures  All topics are updated as new evidence becomes available and our peer review process is complete.  Literature review current through: Feb 2014.  This topic last updated: Oct 31, 2011.  INTRODUCTION - Group B streptococcus (GBS) is a bacterium that can cause serious infections in pregnant women and newborn babies. GBS is one of many types of streptococcal bacteria, sometimes called "strep." This article discusses GBS, its effect on pregnant women and infants, and ways to prevent complications of GBS. More detailed information about GBS is available by subscription. (See "Group B streptococcal infection in pregnant women".) WHAT IS GROUP B STREP INFECTION? - GBS is commonly found in the digestive system and the vagina. In healthy adults, GBS is not harmful and does not cause problems. But in pregnant women and newborn infants, being infected with GBS can cause serious illness. Approximately one in three to four pregnant women in the [place] carries GBS in their gastrointestinal system and/or in their vagina. Carrying GBS is not the same as being infected. Carriers are not sick and do not need treatment during pregnancy. There is no treatment that can stop you from carrying GBS.  Pregnant women who are carriers of GBS infrequently become infected with GBS. GBS can cause urinary tract infections, infection of the amniotic fluid (bag of water), and infection of the uterus after delivery. GBS infections during pregnancy may lead to preterm labor.  Pregnant women who carry GBS can pass on the bacteria to their newborns, and some of those babies become infected with GBS. Newborns who are infected with GBS can develop pneumonia (lung infection), septicemia (blood infection), or  meningitis (infection of the lining of the brain and spinal cord). These complications can be prevented by giving intravenous antibiotics during labor to any woman who is at risk of GBS infection. You are at risk of GBS infection if: You have a urine culture during your current pregnancy showing GBS  You have a vaginal and rectal culture during your current pregnancy showing GBS  You had an infant infected with GBS in the past GROUP B STREP PREVENTION - Most doctors and nurses recommend a urine culture early in your pregnancy to be sure that you do not have a bladder infection without symptoms. If you urine culture shows GBS or other bacteria, you may be treated with an antibiotic. If you have symptoms of urinary infection, such as pain with urination, any time during your pregnancy, a urine culture is done. If GBS grows from the urine culture, it should be treated with an antibiotic, and you should also receive intravenous antibiotics during labor. Expert groups recommend that all pregnant women have a GBS culture at 35 to 37 weeks of pregnancy. The culture is done by swabbing the vagina and rectum. If your GBS culture is positive, you will be given an intravenous antibiotic during labor. If you have preterm labor, the culture is done then and an intravenous antibiotic is given until the baby is born or the labor is stopped by your health care provider. If you have a positive GBS culture and you have an allergy to penicillin, be sure your doctor and nurse are aware of this allergy and tell them what happened with the allergy. If you had only a rash or itching, this   is not a serious allergy, and you can receive a common drug related to the penicillin. If you had a serious allergy (for example, trouble breathing, swelling of your face) you may need an additional test to determine which antibiotic should be used during labor. Being treated with an antibiotic during labor greatly reduces the chance that you or  your newborn will develop infections related to GBS. It is important to note that young infants up to age 3 months can also develop septicemia, meningitis and other serious infections from GBS. Being treated with an antibiotic during labor does not reduce the chance that your baby will develop this later type of infection. There is currently no known way of preventing this later-onset GBS disease. WHERE TO GET MORE INFORMATION - Your healthcare provider is the best source of information for questions and concerns related to your medical problem.  

## 2013-09-16 NOTE — Progress Notes (Signed)
Subjective:    Sherri Castaneda is a 28 y.o. female being seen today for her obstetrical visit. She is at [redacted]w[redacted]d  gestation. Patient reports no complaints. Fetal movement: normal.   Past Medical History  Diagnosis Date  . Seasonal allergies   . Preterm labor     Delivery at 32 weeks and 34 weeks.    Past Surgical History  Procedure Laterality Date  . Gynecologic cryosurgery      Current outpatient prescriptions:acetaminophen (TYLENOL) 500 MG tablet, Take 500 mg by mouth daily as needed for moderate pain., Disp: , Rfl: ;  ondansetron (ZOFRAN) 4 MG tablet, Take 1 tablet (4 mg total) by mouth every 8 (eight) hours as needed for nausea., Disp: 20 tablet, Rfl: 1;  pantoprazole (PROTONIX) 40 MG tablet, Take 1 tablet (40 mg total) by mouth daily., Disp: 30 tablet, Rfl: 3 Prenat-FeCbn-FeAspGl-FA-Omega (OB COMPLETE PETITE) 35-5-1-200 MG CAPS, Take 1 capsule by mouth daily., Disp: 30 capsule, Rfl: 11 Allergies  Allergen Reactions  . Flagyl [Metronidazole] Swelling  . Pineapple Itching and Swelling  . Latex Itching and Rash    History  Substance Use Topics  . Smoking status: Never Smoker   . Smokeless tobacco: Not on file  . Alcohol Use: No    Family History  Problem Relation Age of Onset  . Cancer Mother   . Cancer Maternal Grandmother   . Cancer Father      Review of Systems Constitutional: negative for anorexia Gastrointestinal: negative for abdominal pain Genitourinary:negative for vaginal discharge Musculoskeletal:negative for back pain Behavioral/Psych: negative for depression and tobacco use   Objective:    BP 113/69  Pulse 98  Temp(Src) 98.8 F (37.1 C)  Wt 78.472 kg (173 lb)  LMP 01/06/2013 FHT:  130 BPM  Uterine Size: size equals dates  Presentation: cephalic     Assessment:    Pregnancy @ [redacted]w[redacted]d  weeks   Plan:     labs reviewed, problem list updated  Orders Placed This Encounter  Procedures  . POCT urinalysis dipstick    Follow up in 1 Week

## 2013-09-16 NOTE — Addendum Note (Signed)
Addended by: Ladona Ridgel on: 09/16/2013 05:15 PM   Modules accepted: Orders

## 2013-09-18 ENCOUNTER — Inpatient Hospital Stay (HOSPITAL_COMMUNITY)
Admission: AD | Admit: 2013-09-18 | Discharge: 2013-09-18 | Disposition: A | Discharge status: Home / Self Care | Payer: BC Managed Care – PPO | Source: Ambulatory Visit | Attending: Obstetrics & Gynecology | Admitting: Obstetrics & Gynecology

## 2013-09-18 ENCOUNTER — Encounter (HOSPITAL_COMMUNITY): Payer: Self-pay | Admitting: *Deleted

## 2013-09-18 DIAGNOSIS — O479 False labor, unspecified: Secondary | ICD-10-CM | POA: Insufficient documentation

## 2013-09-18 LAB — STREP B DNA PROBE: STREP GROUP B AG: DETECTED

## 2013-09-18 NOTE — Progress Notes (Signed)
Dr Delsa Sale paged , called twice no response.

## 2013-09-18 NOTE — Discharge Instructions (Signed)

## 2013-09-18 NOTE — [facility] Note (Signed)
Pt states she has been having contractions all day.

## 2013-09-21 ENCOUNTER — Encounter: Payer: Self-pay | Admitting: Obstetrics & Gynecology

## 2013-09-21 DIAGNOSIS — O99820 Streptococcus B carrier state complicating pregnancy: Secondary | ICD-10-CM | POA: Insufficient documentation

## 2013-09-25 ENCOUNTER — Encounter: Payer: Self-pay | Admitting: Obstetrics & Gynecology

## 2013-09-25 ENCOUNTER — Encounter: Payer: Self-pay | Admitting: *Deleted

## 2013-09-25 ENCOUNTER — Ambulatory Visit (INDEPENDENT_AMBULATORY_CARE_PROVIDER_SITE_OTHER): Payer: BC Managed Care – PPO | Admitting: Obstetrics & Gynecology

## 2013-09-25 VITALS — BP 117/73 | HR 84 | Temp 98.5°F | Wt 170.0 lb

## 2013-09-25 DIAGNOSIS — O9989 Other specified diseases and conditions complicating pregnancy, childbirth and the puerperium: Secondary | ICD-10-CM

## 2013-09-25 DIAGNOSIS — R102 Pelvic and perineal pain: Secondary | ICD-10-CM | POA: Insufficient documentation

## 2013-09-25 DIAGNOSIS — N949 Unspecified condition associated with female genital organs and menstrual cycle: Secondary | ICD-10-CM

## 2013-09-25 DIAGNOSIS — O26893 Other specified pregnancy related conditions, third trimester: Secondary | ICD-10-CM | POA: Insufficient documentation

## 2013-09-25 DIAGNOSIS — Z3480 Encounter for supervision of other normal pregnancy, unspecified trimester: Secondary | ICD-10-CM

## 2013-09-25 LAB — POCT URINALYSIS DIPSTICK
Bilirubin, UA: NEGATIVE
Glucose, UA: NEGATIVE
Ketones, UA: NEGATIVE
Leukocytes, UA: NEGATIVE
Nitrite, UA: NEGATIVE
PH UA: 8
Protein, UA: NEGATIVE
RBC UA: NEGATIVE
UROBILINOGEN UA: NEGATIVE

## 2013-09-25 MED ORDER — TRAMADOL HCL 50 MG PO TABS: 50.0000 mg | ORAL_TABLET | Freq: Four times a day (QID) | ORAL | Status: DC | PRN

## 2013-09-25 NOTE — Progress Notes (Signed)
Subjective:    Sherri Castaneda is a 28 y.o. female being seen today for her obstetrical visit. She is at [redacted]w[redacted]d gestation. Patient reports pain over pubic symphysis. Fetal movement: normal.  Problem List Items Addressed This Visit   Supervision of other normal pregnancy - Primary   Relevant Orders      POCT urinalysis dipstick   Pregnancy related symphysis pain in third trimester, antepartum     Patient Active Problem List   Diagnosis Date Noted  . Pregnancy related symphysis pain in third trimester, antepartum 09/25/2013  . GBS (group B Streptococcus carrier), +RV culture, currently pregnant 09/21/2013  . Supervision of other normal pregnancy 03/18/2013  . Pregnancy related nausea and vomiting, antepartum 03/18/2013  . Previous preterm delivery, antepartum 03/18/2013    Objective:    BP 117/73  Pulse 84  Temp(Src) 98.5 F (36.9 C)  Wt 77.111 kg (170 lb)  LMP 01/06/2013 FHT: 140 BPM  Uterine Size: size equals dates  Presentations: cephalic     Assessment:    Pregnancy @ [redacted]w[redacted]d weeks   Plan:     Meds ordered this encounter  Medications  . traMADol (ULTRAM) 50 MG tablet    Sig: Take 1 tablet (50 mg total) by mouth every 6 (six) hours as needed.    Dispense:  60 tablet    Refill:  0   Plans for delivery: Vaginal anticipated; labs reviewed; problem list updated Counseling: Consent signed. Infant feeding: plans to breastfeed. Cigarette smoking: never smoked. L&D discussion: symptoms of labor, discussed when to call, discussed what number to call, anesthetic/analgesic options reviewed and delivering clinician:  plans no preference. Postpartum supports and preparation: circumcision discussed and contraception plans discussed.  Follow up in 1 Week.

## 2013-09-25 NOTE — Progress Notes (Signed)
Mucus discharge for 1 week. Patient was seen at [facility]- monitored and checked.

## 2013-09-29 ENCOUNTER — Encounter (HOSPITAL_COMMUNITY): Payer: Self-pay

## 2013-09-29 ENCOUNTER — Inpatient Hospital Stay (HOSPITAL_COMMUNITY)
Admission: AD | Admit: 2013-09-29 | Discharge: 2013-09-29 | Disposition: A | Discharge status: Home / Self Care | Payer: BC Managed Care – PPO | Source: Ambulatory Visit | Attending: Obstetrics & Gynecology | Admitting: Obstetrics & Gynecology

## 2013-09-29 DIAGNOSIS — O09219 Supervision of pregnancy with history of pre-term labor, unspecified trimester: Secondary | ICD-10-CM

## 2013-09-29 DIAGNOSIS — O26893 Other specified pregnancy related conditions, third trimester: Secondary | ICD-10-CM

## 2013-09-29 DIAGNOSIS — O99820 Streptococcus B carrier state complicating pregnancy: Secondary | ICD-10-CM

## 2013-09-29 DIAGNOSIS — N949 Unspecified condition associated with female genital organs and menstrual cycle: Secondary | ICD-10-CM

## 2013-09-29 DIAGNOSIS — O219 Vomiting of pregnancy, unspecified: Secondary | ICD-10-CM

## 2013-09-29 DIAGNOSIS — R102 Pelvic and perineal pain: Secondary | ICD-10-CM

## 2013-09-29 DIAGNOSIS — O9989 Other specified diseases and conditions complicating pregnancy, childbirth and the puerperium: Secondary | ICD-10-CM

## 2013-09-29 DIAGNOSIS — Z3480 Encounter for supervision of other normal pregnancy, unspecified trimester: Secondary | ICD-10-CM

## 2013-09-29 DIAGNOSIS — O479 False labor, unspecified: Secondary | ICD-10-CM | POA: Insufficient documentation

## 2013-09-29 NOTE — Discharge Instructions (Signed)
Braxton Hicks Contractions Pregnancy is commonly associated with contractions of the uterus throughout the pregnancy. Towards the end of pregnancy (32 to 34 weeks), these contractions (Braxton Hicks) can develop more often and may become more forceful. This is not true labor because these contractions do not result in opening (dilatation) and thinning of the cervix. They are sometimes difficult to tell apart from true labor because these contractions can be forceful and people have different pain tolerances. You should not feel embarrassed if you go to the hospital with false labor. Sometimes, the only way to tell if you are in true labor is for your caregiver to follow the changes in the cervix. How to tell the difference between true and false labor:  False labor.  The contractions of false labor are usually shorter, irregular and not as hard as those of true labor.  They are often felt in the front of the lower abdomen and in the groin.  They may leave with walking around or changing positions while lying down.  They get weaker and are shorter lasting as time goes on.  These contractions are usually irregular.  They do not usually become progressively stronger, regular and closer together as with true labor.  True labor.  Contractions in true labor last 30 to 70 seconds, become very regular, usually become more intense, and increase in frequency.  They do not go away with walking.  The discomfort is usually felt in the top of the uterus and spreads to the lower abdomen and low back.  True labor can be determined by your caregiver with an exam. This will show that the cervix is dilating and getting thinner. If there are no prenatal problems or other health problems associated with the pregnancy, it is completely safe to be sent home with false labor and await the onset of true labor. HOME CARE INSTRUCTIONS   Keep up with your usual exercises and instructions.  Take medications as  directed.  Keep your regular prenatal appointment.  Eat and drink lightly if you think you are going into labor.  If BH contractions are making you uncomfortable:  Change your activity position from lying down or resting to walking/walking to resting.  Sit and rest in a tub of warm water.  Drink 2 to 3 glasses of water. Dehydration may cause B-H contractions.  Do slow and deep breathing several times an hour. SEEK IMMEDIATE MEDICAL CARE IF:   Your contractions continue to become stronger, more regular, and closer together.  You have a gushing, burst or leaking of fluid from the vagina.  An oral temperature above 102 F (38.9 C) develops.  You have passage of blood-tinged mucus.  You develop vaginal bleeding.  You develop continuous belly (abdominal) pain.  You have low back pain that you never had before.  You feel the baby's head pushing down causing pelvic pressure.  The baby is not moving as much as it used to. Document Released: 04/18/2005 Document Revised: 07/11/2011 Document Reviewed: 01/28/2013 [employer] Patient Information 2014 [employer]  Fetal Movement Counts Patient Name: __________________________________________________ Patient Due Date: ____________________ Performing a fetal movement count is highly recommended in high-risk pregnancies, but it is good for every pregnant woman to do. Your caregiver may ask you to start counting fetal movements at 28 weeks of the pregnancy. Fetal movements often increase:  After eating a full meal.  After physical activity.  After eating or drinking something sweet or cold.  At rest. Pay attention to when you feel   the baby is most active. This will help you notice a pattern of your baby's sleep and wake cycles and what factors contribute to an increase in fetal movement. It is important to perform a fetal movement count at the same time each day when your baby is normally most active.  HOW TO COUNT FETAL  MOVEMENTS 1. Find a quiet and comfortable area to sit or lie down on your left side. Lying on your left side provides the best blood and oxygen circulation to your baby. 2. Write down the day and time on a sheet of paper or in a journal. 3. Start counting kicks, flutters, swishes, rolls, or jabs in a 2 hour period. You should feel at least 10 movements within 2 hours. 4. If you do not feel 10 movements in 2 hours, wait 2 3 hours and count again. Look for a change in the pattern or not enough counts in 2 hours. SEEK MEDICAL CARE IF:  You feel less than 10 counts in 2 hours, tried twice.  There is no movement in over an hour.  The pattern is changing or taking longer each day to reach 10 counts in 2 hours.  You feel the baby is not moving as he or she usually does. Date: ____________ Movements: ____________ Start time: ____________ Finish time: ____________  Date: ____________ Movements: ____________ Start time: ____________ Finish time: ____________ Date: ____________ Movements: ____________ Start time: ____________ Finish time: ____________ Date: ____________ Movements: ____________ Start time: ____________ Finish time: ____________ Date: ____________ Movements: ____________ Start time: ____________ Finish time: ____________ Date: ____________ Movements: ____________ Start time: ____________ Finish time: ____________ Date: ____________ Movements: ____________ Start time: ____________ Finish time: ____________ Date: ____________ Movements: ____________ Start time: ____________ Finish time: ____________  Date: ____________ Movements: ____________ Start time: ____________ Finish time: ____________ Date: ____________ Movements: ____________ Start time: ____________ Finish time: ____________ Date: ____________ Movements: ____________ Start time: ____________ Finish time: ____________ Date: ____________ Movements: ____________ Start time: ____________ Finish time: ____________ Date: ____________  Movements: ____________ Start time: ____________ Finish time: ____________ Date: ____________ Movements: ____________ Start time: ____________ Finish time: ____________ Date: ____________ Movements: ____________ Start time: ____________ Finish time: ____________  Date: ____________ Movements: ____________ Start time: ____________ Finish time: ____________ Date: ____________ Movements: ____________ Start time: ____________ Finish time: ____________ Date: ____________ Movements: ____________ Start time: ____________ Finish time: ____________ Date: ____________ Movements: ____________ Start time: ____________ Finish time: ____________ Date: ____________ Movements: ____________ Start time: ____________ Finish time: ____________ Date: ____________ Movements: ____________ Start time: ____________ Finish time: ____________ Date: ____________ Movements: ____________ Start time: ____________ Finish time: ____________  Date: ____________ Movements: ____________ Start time: ____________ Finish time: ____________ Date: ____________ Movements: ____________ Start time: ____________ Finish time: ____________ Date: ____________ Movements: ____________ Start time: ____________ Finish time: ____________ Date: ____________ Movements: ____________ Start time: ____________ Finish time: ____________ Date: ____________ Movements: ____________ Start time: ____________ Finish time: ____________ Date: ____________ Movements: ____________ Start time: ____________ Finish time: ____________ Date: ____________ Movements: ____________ Start time: ____________ Finish time: ____________  Date: ____________ Movements: ____________ Start time: ____________ Finish time: ____________ Date: ____________ Movements: ____________ Start time: ____________ Finish time: ____________ Date: ____________ Movements: ____________ Start time: ____________ Finish time: ____________ Date: ____________ Movements: ____________ Start time:  ____________ Finish time: ____________ Date: ____________ Movements: ____________ Start time: ____________ Finish time: ____________ Date: ____________ Movements: ____________ Start time: ____________ Finish time: ____________ Date: ____________ Movements: ____________ Start time: ____________ Finish time: ____________  Date: ____________ Movements: ____________ Start time: ____________ Finish time: ____________ Date: ____________ Movements: ____________ Start   time: ____________ Finish time: ____________ Date: ____________ Movements: ____________ Start time: ____________ Finish time: ____________ Date: ____________ Movements: ____________ Start time: ____________ Finish time: ____________ Date: ____________ Movements: ____________ Start time: ____________ Finish time: ____________ Date: ____________ Movements: ____________ Start time: ____________ Finish time: ____________ Date: ____________ Movements: ____________ Start time: ____________ Finish time: ____________  Date: ____________ Movements: ____________ Start time: ____________ Finish time: ____________ Date: ____________ Movements: ____________ Start time: ____________ Finish time: ____________ Date: ____________ Movements: ____________ Start time: ____________ Finish time: ____________ Date: ____________ Movements: ____________ Start time: ____________ Finish time: ____________ Date: ____________ Movements: ____________ Start time: ____________ Finish time: ____________ Date: ____________ Movements: ____________ Start time: ____________ Finish time: ____________ Date: ____________ Movements: ____________ Start time: ____________ Finish time: ____________  Date: ____________ Movements: ____________ Start time: ____________ Finish time: ____________ Date: ____________ Movements: ____________ Start time: ____________ Finish time: ____________ Date: ____________ Movements: ____________ Start time: ____________ Finish time: ____________ Date:  ____________ Movements: ____________ Start time: ____________ Finish time: ____________ Date: ____________ Movements: ____________ Start time: ____________ Finish time: ____________ Date: ____________ Movements: ____________ Start time: ____________ Finish time: ____________ Document Released: 05/18/2006 Document Revised: 04/04/2012 Document Reviewed: 02/13/2012 [employer] Patient Information 2014 [employer].  

## 2013-09-29 NOTE — [facility] Note (Signed)
Pt presents with complaint of contractions and ? Leaking fluid.

## 2013-09-29 NOTE — MAU Note (Signed)
Pt states contractions have stopped & wants to go home rather than having cervix rechecked.

## 2013-09-29 NOTE — MAU Provider Note (Signed)
  History     CSN: [id number]  Arrival date and time: 09/29/13 6433   First Provider Initiated Contact with Patient 09/29/13 1940      Chief Complaint  Patient presents with  . Labor Eval   HPI This is a 28 y.o. female at [redacted]w[redacted]d who presents with c/o contractions and possible single episode of leaking fluid.  Denies bleeding.   I was asked to do a speculum exam to rule out rupture of membranes  RN Note:  Pt presents with complaint of contractions and ? Leaking fluid.        OB History   Grav Para Term Preterm Abortions TAB SAB Ect Mult Living   4 3 1 2      3       Past Medical History  Diagnosis Date  . Seasonal allergies   . Preterm labor     Delivery at 32 weeks and 34 weeks.    Past Surgical History  Procedure Laterality Date  . Gynecologic cryosurgery  2006    Family History  Problem Relation Age of Onset  . Cancer Mother   . Cancer Maternal Grandmother   . Cancer Father     History  Substance Use Topics  . Smoking status: Never Smoker   . Smokeless tobacco: Not on file  . Alcohol Use: No    Allergies:  Allergies  Allergen Reactions  . Flagyl [Metronidazole] Swelling  . Pineapple Itching and Swelling  . Latex Itching and Rash    Prescriptions prior to admission  Medication Sig Dispense Refill  . ondansetron (ZOFRAN) 4 MG tablet Take 1 tablet (4 mg total) by mouth every 8 (eight) hours as needed for nausea.  20 tablet  1  . pantoprazole (PROTONIX) 40 MG tablet Take 1 tablet (40 mg total) by mouth daily.  30 tablet  3  . Prenat-FeCbn-FeAspGl-FA-Omega (OB COMPLETE PETITE) 35-5-1-200 MG CAPS Take 1 capsule by mouth daily.  30 capsule  11  . traMADol (ULTRAM) 50 MG tablet Take 1 tablet (50 mg total) by mouth every 6 (six) hours as needed.  60 tablet  0    Review of Systems  Constitutional: Negative for fever, chills and malaise/fatigue.  Gastrointestinal: Positive for abdominal pain. Negative for nausea and vomiting.  Genitourinary:       One  episode of leaking fluid, none since   Neurological: Negative for headaches.   Physical Exam   Blood pressure 115/64, pulse 94, temperature 98.9 F (37.2 C), temperature source Oral, resp. rate 20, last menstrual period 01/06/2013.  Physical Exam  Constitutional: She is oriented to person, place, and time. She appears well-developed and well-nourished. No distress.  Cardiovascular: Normal rate.   Respiratory: Effort normal.  GI: Soft. There is no tenderness. There is no rebound and no guarding.  Genitourinary: Vaginal discharge (very scant creamy white discharge, no pooling) found.  Musculoskeletal: Normal range of motion.  Neurological: She is alert and oriented to person, place, and time.  Skin: Skin is warm and dry.  Psychiatric: She has a normal mood and affect.   Sterile Speculum Exam done  MAU Course  Procedures  MDM RN will complete labor check and call DR Ruthann Cancer  Assessment and Plan  RN to finish labor check  Seabron Spates 09/29/2013, 7:43 PM

## 2013-10-02 ENCOUNTER — Encounter: Payer: Self-pay | Admitting: Obstetrics & Gynecology

## 2013-10-02 ENCOUNTER — Ambulatory Visit (INDEPENDENT_AMBULATORY_CARE_PROVIDER_SITE_OTHER): Payer: BC Managed Care – PPO | Admitting: Obstetrics & Gynecology

## 2013-10-02 VITALS — BP 112/72 | HR 97 | Temp 98.5°F | Wt 171.0 lb

## 2013-10-02 DIAGNOSIS — O368190 Decreased fetal movements, unspecified trimester, not applicable or unspecified: Secondary | ICD-10-CM

## 2013-10-02 DIAGNOSIS — Z3480 Encounter for supervision of other normal pregnancy, unspecified trimester: Secondary | ICD-10-CM

## 2013-10-02 LAB — POCT URINALYSIS DIPSTICK
Bilirubin, UA: NEGATIVE
Glucose, UA: NEGATIVE
KETONES UA: NEGATIVE
Nitrite, UA: NEGATIVE
PROTEIN UA: NEGATIVE
RBC UA: NEGATIVE
Spec Grav, UA: 1.01
UROBILINOGEN UA: NEGATIVE
pH, UA: 8.5

## 2013-10-06 NOTE — Patient Instructions (Signed)
Fetal Movement Counts Patient Name: __________________________________________________ Patient Due Date: ____________________ Performing a fetal movement count is highly recommended in high-risk pregnancies, but it is good for every pregnant woman to do. Your caregiver may ask you to start counting fetal movements at 28 weeks of the pregnancy. Fetal movements often increase:  After eating a full meal.  After physical activity.  After eating or drinking something sweet or cold.  At rest. Pay attention to when you feel the baby is most active. This will help you notice a pattern of your baby's sleep and wake cycles and what factors contribute to an increase in fetal movement. It is important to perform a fetal movement count at the same time each day when your baby is normally most active.  HOW TO COUNT FETAL MOVEMENTS 1. Find a quiet and comfortable area to sit or lie down on your left side. Lying on your left side provides the best blood and oxygen circulation to your baby. 2. Write down the day and time on a sheet of paper or in a journal. 3. Start counting kicks, flutters, swishes, rolls, or jabs in a 2 hour period. You should feel at least 10 movements within 2 hours. 4. If you do not feel 10 movements in 2 hours, wait 2 3 hours and count again. Look for a change in the pattern or not enough counts in 2 hours. SEEK MEDICAL CARE IF:  You feel less than 10 counts in 2 hours, tried twice.  There is no movement in over an hour.  The pattern is changing or taking longer each day to reach 10 counts in 2 hours.  You feel the baby is not moving as he or she usually does. Date: ____________ Movements: ____________ Start time: ____________ Finish time: ____________  Date: ____________ Movements: ____________ Start time: ____________ Finish time: ____________ Date: ____________ Movements: ____________ Start time: ____________ Finish time: ____________ Date: ____________ Movements: ____________  Start time: ____________ Finish time: ____________ Date: ____________ Movements: ____________ Start time: ____________ Finish time: ____________ Date: ____________ Movements: ____________ Start time: ____________ Finish time: ____________ Date: ____________ Movements: ____________ Start time: ____________ Finish time: ____________ Date: ____________ Movements: ____________ Start time: ____________ Finish time: ____________  Date: ____________ Movements: ____________ Start time: ____________ Finish time: ____________ Date: ____________ Movements: ____________ Start time: ____________ Finish time: ____________ Date: ____________ Movements: ____________ Start time: ____________ Finish time: ____________ Date: ____________ Movements: ____________ Start time: ____________ Finish time: ____________ Date: ____________ Movements: ____________ Start time: ____________ Finish time: ____________ Date: ____________ Movements: ____________ Start time: ____________ Finish time: ____________ Date: ____________ Movements: ____________ Start time: ____________ Finish time: ____________  Date: ____________ Movements: ____________ Start time: ____________ Finish time: ____________ Date: ____________ Movements: ____________ Start time: ____________ Finish time: ____________ Date: ____________ Movements: ____________ Start time: ____________ Finish time: ____________ Date: ____________ Movements: ____________ Start time: ____________ Finish time: ____________ Date: ____________ Movements: ____________ Start time: ____________ Finish time: ____________ Date: ____________ Movements: ____________ Start time: ____________ Finish time: ____________ Date: ____________ Movements: ____________ Start time: ____________ Finish time: ____________  Date: ____________ Movements: ____________ Start time: ____________ Finish time: ____________ Date: ____________ Movements: ____________ Start time: ____________ Finish time:  ____________ Date: ____________ Movements: ____________ Start time: ____________ Finish time: ____________ Date: ____________ Movements: ____________ Start time: ____________ Finish time: ____________ Date: ____________ Movements: ____________ Start time: ____________ Finish time: ____________ Date: ____________ Movements: ____________ Start time: ____________ Finish time: ____________ Date: ____________ Movements: ____________ Start time: ____________ Finish time: ____________  Date: ____________ Movements: ____________ Start time: ____________ Finish   time: ____________ Date: ____________ Movements: ____________ Start time: ____________ Finish time: ____________ Date: ____________ Movements: ____________ Start time: ____________ Finish time: ____________ Date: ____________ Movements: ____________ Start time: ____________ Finish time: ____________ Date: ____________ Movements: ____________ Start time: ____________ Finish time: ____________ Date: ____________ Movements: ____________ Start time: ____________ Finish time: ____________ Date: ____________ Movements: ____________ Start time: ____________ Finish time: ____________  Date: ____________ Movements: ____________ Start time: ____________ Finish time: ____________ Date: ____________ Movements: ____________ Start time: ____________ Finish time: ____________ Date: ____________ Movements: ____________ Start time: ____________ Finish time: ____________ Date: ____________ Movements: ____________ Start time: ____________ Finish time: ____________ Date: ____________ Movements: ____________ Start time: ____________ Finish time: ____________ Date: ____________ Movements: ____________ Start time: ____________ Finish time: ____________ Date: ____________ Movements: ____________ Start time: ____________ Finish time: ____________  Date: ____________ Movements: ____________ Start time: ____________ Finish time: ____________ Date: ____________ Movements:  ____________ Start time: ____________ Finish time: ____________ Date: ____________ Movements: ____________ Start time: ____________ Finish time: ____________ Date: ____________ Movements: ____________ Start time: ____________ Finish time: ____________ Date: ____________ Movements: ____________ Start time: ____________ Finish time: ____________ Date: ____________ Movements: ____________ Start time: ____________ Finish time: ____________ Date: ____________ Movements: ____________ Start time: ____________ Finish time: ____________  Date: ____________ Movements: ____________ Start time: ____________ Finish time: ____________ Date: ____________ Movements: ____________ Start time: ____________ Finish time: ____________ Date: ____________ Movements: ____________ Start time: ____________ Finish time: ____________ Date: ____________ Movements: ____________ Start time: ____________ Finish time: ____________ Date: ____________ Movements: ____________ Start time: ____________ Finish time: ____________ Date: ____________ Movements: ____________ Start time: ____________ Finish time: ____________ Document Released: 05/18/2006 Document Revised: 04/04/2012 Document Reviewed: 02/13/2012 [employer] Patient Information 2014 [employer]  

## 2013-10-06 NOTE — Progress Notes (Signed)
Subjective:    Sherri Castaneda is a  being seen today for her obstetrical visit. She is at [redacted]w[redacted]d gestation. Patient reports no complaints. Fetal movement: decreased.  Problem List Items Addressed This Visit   Supervision of other normal pregnancy - Primary   Relevant Orders      POCT urinalysis dipstick (Completed)     Patient Active Problem List   Diagnosis Date Noted  . Pregnancy related symphysis pain in third trimester, antepartum 09/25/2013  . GBS (group B Streptococcus carrier), +RV culture, currently pregnant 09/21/2013  . Supervision of other normal pregnancy 03/18/2013  . Pregnancy related nausea and vomiting, antepartum 03/18/2013  . Previous preterm delivery, antepartum 03/18/2013    Objective:    BP 112/72  Pulse 97  Temp(Src) 98.5 F (36.9 C)  Wt 77.565 kg (171 lb)  LMP 01/06/2013 FHT: 130 BPM  Uterine Size: size equals dates  Presentations: cephalic     Assessment:    Pregnancy @ [redacted]w[redacted]d weeks   Plan:      Plans for delivery: Vaginal anticipated; labs reviewed; problem list updated  Follow up in 1 Week.

## 2013-10-07 ENCOUNTER — Ambulatory Visit (INDEPENDENT_AMBULATORY_CARE_PROVIDER_SITE_OTHER): Payer: BC Managed Care – PPO | Admitting: Obstetrics

## 2013-10-07 ENCOUNTER — Encounter: Payer: Self-pay | Admitting: Obstetrics

## 2013-10-07 VITALS — BP 116/72 | HR 94 | Temp 98.1°F | Wt 171.0 lb

## 2013-10-07 DIAGNOSIS — Z3480 Encounter for supervision of other normal pregnancy, unspecified trimester: Secondary | ICD-10-CM

## 2013-10-07 LAB — POCT URINALYSIS DIPSTICK
Blood, UA: NEGATIVE
Ketones, UA: NEGATIVE
Leukocytes, UA: NEGATIVE
Nitrite, UA: NEGATIVE
PROTEIN UA: NEGATIVE
pH, UA: 8

## 2013-10-07 NOTE — Progress Notes (Signed)
Subjective:    Sherri Castaneda is a 28 y.o. female being seen today for her obstetrical visit. She is at [redacted]w[redacted]d gestation. Patient reports no complaints. Fetal movement: normal.  Problem List Items Addressed This Visit   Supervision of other normal pregnancy - Primary   Relevant Orders      POCT urinalysis dipstick     Patient Active Problem List   Diagnosis Date Noted  . Pregnancy related symphysis pain in third trimester, antepartum 09/25/2013  . GBS (group B Streptococcus carrier), +RV culture, currently pregnant 09/21/2013  . Supervision of other normal pregnancy 03/18/2013  . Pregnancy related nausea and vomiting, antepartum 03/18/2013  . Previous preterm delivery, antepartum 03/18/2013    Objective:    BP 116/72  Pulse 94  Temp(Src) 98.1 F (36.7 C)  Wt 171 lb (77.565 kg)  LMP 01/06/2013 FHT: 140 BPM  Uterine Size: size equals dates  Presentations: cephalic  Pelvic Exam:              Dilation: 2cm       Effacement: 50%             Station:  -3    Consistency: soft            Position: middle     Assessment:    Pregnancy @ [redacted]w[redacted]d weeks   Plan:   Plans for delivery: Vaginal anticipated; labs reviewed; problem list updated Counseling: Consent signed. Infant feeding: plans to breastfeed. Cigarette smoking: never smoked. L&D discussion: symptoms of labor, discussed when to call, discussed what number to call, anesthetic/analgesic options reviewed and delivering clinician:  plans Physician. Postpartum supports and preparation: circumcision discussed and contraception plans discussed.  Follow up in 1 Week.

## 2013-10-09 ENCOUNTER — Encounter (HOSPITAL_COMMUNITY): Payer: BC Managed Care – PPO | Admitting: Anesthesiology

## 2013-10-09 ENCOUNTER — Inpatient Hospital Stay (HOSPITAL_COMMUNITY): Payer: BC Managed Care – PPO | Admitting: Anesthesiology

## 2013-10-09 ENCOUNTER — Inpatient Hospital Stay (HOSPITAL_COMMUNITY)
Admission: AD | Admit: 2013-10-09 | Discharge: 2013-10-11 | DRG: 775 | Disposition: A | Discharge status: Home / Self Care | Payer: BC Managed Care – PPO | Source: Ambulatory Visit | Attending: Obstetrics & Gynecology | Admitting: Obstetrics & Gynecology

## 2013-10-09 ENCOUNTER — Encounter (HOSPITAL_COMMUNITY): Payer: Self-pay

## 2013-10-09 ENCOUNTER — Encounter: Payer: BC Managed Care – PPO | Admitting: Obstetrics & Gynecology

## 2013-10-09 DIAGNOSIS — O99214 Obesity complicating childbirth: Secondary | ICD-10-CM

## 2013-10-09 DIAGNOSIS — O99892 Other specified diseases and conditions complicating childbirth: Principal | ICD-10-CM | POA: Diagnosis present

## 2013-10-09 DIAGNOSIS — IMO0001 Reserved for inherently not codable concepts without codable children: Secondary | ICD-10-CM

## 2013-10-09 DIAGNOSIS — O9989 Other specified diseases and conditions complicating pregnancy, childbirth and the puerperium: Principal | ICD-10-CM

## 2013-10-09 DIAGNOSIS — Z22330 Carrier of Group B streptococcus: Secondary | ICD-10-CM

## 2013-10-09 DIAGNOSIS — E669 Obesity, unspecified: Secondary | ICD-10-CM | POA: Diagnosis present

## 2013-10-09 DIAGNOSIS — Z6832 Body mass index (BMI) 32.0-32.9, adult: Secondary | ICD-10-CM

## 2013-10-09 LAB — CBC
HCT: 34.2 % — ABNORMAL LOW (ref 36.0–46.0)
Hemoglobin: 10.8 g/dL — ABNORMAL LOW (ref 12.0–15.0)
MCH: 24.2 pg — ABNORMAL LOW (ref 26.0–34.0)
MCHC: 31.6 g/dL (ref 30.0–36.0)
MCV: 76.5 fL — ABNORMAL LOW (ref 78.0–100.0)
PLATELETS: 188 10*3/uL (ref 150–400)
RBC: 4.47 MIL/uL (ref 3.87–5.11)
RDW: 15.1 % (ref 11.5–15.5)
WBC: 9.4 10*3/uL (ref 4.0–10.5)

## 2013-10-09 LAB — RPR

## 2013-10-09 MED ORDER — OXYTOCIN 40 UNITS IN LACTATED RINGERS INFUSION - SIMPLE MED
62.5000 mL/h | INTRAVENOUS | Status: DC
  Administered 2013-10-09: 62.5 mL/h via INTRAVENOUS
  Filled 2013-10-09: qty 1000

## 2013-10-09 MED ORDER — TETANUS-DIPHTH-ACELL PERTUSSIS 5-2.5-18.5 LF-MCG/0.5 IM SUSP: 0.5000 mL | Freq: Once | INTRAMUSCULAR | Status: DC

## 2013-10-09 MED ORDER — ACETAMINOPHEN 325 MG PO TABS: 650.0000 mg | ORAL_TABLET | ORAL | Status: DC | PRN

## 2013-10-09 MED ORDER — OXYCODONE-ACETAMINOPHEN 5-325 MG PO TABS: 1.0000 | ORAL_TABLET | ORAL | Status: DC | PRN

## 2013-10-09 MED ORDER — ONDANSETRON HCL 4 MG/2ML IJ SOLN: 4.0000 mg | Freq: Four times a day (QID) | INTRAMUSCULAR | Status: DC | PRN

## 2013-10-09 MED ORDER — LIDOCAINE HCL (PF) 1 % IJ SOLN
30.0000 mL | INTRAMUSCULAR | Status: DC | PRN
  Filled 2013-10-09: qty 30

## 2013-10-09 MED ORDER — LIDOCAINE HCL (PF) 1 % IJ SOLN
INTRAMUSCULAR | Status: DC | PRN
  Administered 2013-10-09 (×2): 4 mL

## 2013-10-09 MED ORDER — CITRIC ACID-SODIUM CITRATE 334-500 MG/5ML PO SOLN: 30.0000 mL | ORAL | Status: DC | PRN

## 2013-10-09 MED ORDER — ONDANSETRON HCL 4 MG/2ML IJ SOLN: 4.0000 mg | INTRAMUSCULAR | Status: DC | PRN

## 2013-10-09 MED ORDER — SODIUM CHLORIDE 0.9 % IV SOLN
2.0000 g | Freq: Once | INTRAVENOUS | Status: AC
  Administered 2013-10-09: 2 g via INTRAVENOUS
  Filled 2013-10-09: qty 2000

## 2013-10-09 MED ORDER — PHENYLEPHRINE 40 MCG/ML (10ML) SYRINGE FOR IV PUSH (FOR BLOOD PRESSURE SUPPORT)
80.0000 ug | PREFILLED_SYRINGE | INTRAVENOUS | Status: DC | PRN
  Filled 2013-10-09: qty 2
  Filled 2013-10-09: qty 10

## 2013-10-09 MED ORDER — DIPHENHYDRAMINE HCL 25 MG PO CAPS: 25.0000 mg | ORAL_CAPSULE | Freq: Four times a day (QID) | ORAL | Status: DC | PRN

## 2013-10-09 MED ORDER — LACTATED RINGERS IV SOLN
500.0000 mL | INTRAVENOUS | Status: DC | PRN
Start: 2013-10-09 — End: 2013-10-09

## 2013-10-09 MED ORDER — IBUPROFEN 600 MG PO TABS
600.0000 mg | ORAL_TABLET | Freq: Four times a day (QID) | ORAL | Status: DC
  Administered 2013-10-09 – 2013-10-11 (×6): 600 mg via ORAL
  Filled 2013-10-09 (×7): qty 1

## 2013-10-09 MED ORDER — DIPHENHYDRAMINE HCL 50 MG/ML IJ SOLN: 12.5000 mg | INTRAMUSCULAR | Status: DC | PRN

## 2013-10-09 MED ORDER — DEXTROSE 5 % IV SOLN
2.5000 10*6.[IU] | INTRAVENOUS | Status: DC
  Filled 2013-10-09 (×2): qty 2.5

## 2013-10-09 MED ORDER — FENTANYL 2.5 MCG/ML BUPIVACAINE 1/10 % EPIDURAL INFUSION (WH - ANES)
INTRAMUSCULAR | Status: DC | PRN
  Administered 2013-10-09: 14 mL/h via EPIDURAL

## 2013-10-09 MED ORDER — PRENATAL MULTIVITAMIN CH
1.0000 | ORAL_TABLET | Freq: Every day | ORAL | Status: DC
  Administered 2013-10-10: 1 via ORAL
  Filled 2013-10-09: qty 1

## 2013-10-09 MED ORDER — PHENYLEPHRINE 40 MCG/ML (10ML) SYRINGE FOR IV PUSH (FOR BLOOD PRESSURE SUPPORT)
80.0000 ug | PREFILLED_SYRINGE | INTRAVENOUS | Status: DC | PRN
  Filled 2013-10-09: qty 2

## 2013-10-09 MED ORDER — BENZOCAINE-MENTHOL 20-0.5 % EX AERO
1.0000 "application " | INHALATION_SPRAY | CUTANEOUS | Status: DC | PRN
  Administered 2013-10-09: 1 via TOPICAL
  Filled 2013-10-09: qty 56

## 2013-10-09 MED ORDER — IBUPROFEN 600 MG PO TABS
600.0000 mg | ORAL_TABLET | Freq: Four times a day (QID) | ORAL | Status: DC | PRN
  Administered 2013-10-09: 600 mg via ORAL
  Filled 2013-10-09: qty 1

## 2013-10-09 MED ORDER — LANOLIN HYDROUS EX OINT: TOPICAL_OINTMENT | CUTANEOUS | Status: DC | PRN

## 2013-10-09 MED ORDER — PENICILLIN G POTASSIUM 5000000 UNITS IJ SOLR
5.0000 10*6.[IU] | Freq: Once | INTRAVENOUS | Status: DC
  Filled 2013-10-09: qty 5

## 2013-10-09 MED ORDER — OXYTOCIN BOLUS FROM INFUSION
500.0000 mL | INTRAVENOUS | Status: DC
  Administered 2013-10-09: 500 mL via INTRAVENOUS

## 2013-10-09 MED ORDER — FENTANYL 2.5 MCG/ML BUPIVACAINE 1/10 % EPIDURAL INFUSION (WH - ANES)
14.0000 mL/h | INTRAMUSCULAR | Status: DC | PRN
  Filled 2013-10-09: qty 125

## 2013-10-09 MED ORDER — FERROUS SULFATE 325 (65 FE) MG PO TABS
325.0000 mg | ORAL_TABLET | Freq: Two times a day (BID) | ORAL | Status: DC
Start: 2013-10-09 — End: 2013-10-11
  Administered 2013-10-09 – 2013-10-11 (×4): 325 mg via ORAL
  Filled 2013-10-09 (×4): qty 1

## 2013-10-09 MED ORDER — MEASLES, MUMPS & RUBELLA VAC ~~LOC~~ INJ: 0.5000 mL | INJECTION | Freq: Once | SUBCUTANEOUS | Status: DC

## 2013-10-09 MED ORDER — EPHEDRINE 5 MG/ML INJ
10.0000 mg | INTRAVENOUS | Status: DC | PRN
  Filled 2013-10-09: qty 4
  Filled 2013-10-09: qty 2

## 2013-10-09 MED ORDER — ONDANSETRON HCL 4 MG PO TABS: 4.0000 mg | ORAL_TABLET | ORAL | Status: DC | PRN

## 2013-10-09 MED ORDER — FENTANYL CITRATE 0.05 MG/ML IJ SOLN
100.0000 ug | INTRAMUSCULAR | Status: DC | PRN
  Administered 2013-10-09: 100 ug via INTRAVENOUS
  Filled 2013-10-09: qty 2

## 2013-10-09 MED ORDER — EPHEDRINE 5 MG/ML INJ
10.0000 mg | INTRAVENOUS | Status: DC | PRN
Start: 2013-10-09 — End: 2013-10-09
  Filled 2013-10-09: qty 2

## 2013-10-09 MED ORDER — SENNOSIDES-DOCUSATE SODIUM 8.6-50 MG PO TABS
2.0000 | ORAL_TABLET | ORAL | Status: DC
  Administered 2013-10-09 – 2013-10-10 (×2): 2 via ORAL
  Filled 2013-10-09 (×2): qty 2

## 2013-10-09 MED ORDER — LACTATED RINGERS IV SOLN
INTRAVENOUS | Status: DC
  Administered 2013-10-09: 08:00:00 via INTRAVENOUS

## 2013-10-09 MED ORDER — WITCH HAZEL-GLYCERIN EX PADS: 1.0000 "application " | MEDICATED_PAD | CUTANEOUS | Status: DC | PRN

## 2013-10-09 MED ORDER — LACTATED RINGERS IV SOLN
500.0000 mL | Freq: Once | INTRAVENOUS | Status: AC
  Administered 2013-10-09: 500 mL via INTRAVENOUS

## 2013-10-09 MED ORDER — ZOLPIDEM TARTRATE 5 MG PO TABS: 5.0000 mg | ORAL_TABLET | Freq: Every evening | ORAL | Status: DC | PRN

## 2013-10-09 MED ORDER — MAGNESIUM HYDROXIDE 400 MG/5ML PO SUSP: 30.0000 mL | ORAL | Status: DC | PRN

## 2013-10-09 MED ORDER — DIBUCAINE 1 % RE OINT: 1.0000 "application " | TOPICAL_OINTMENT | RECTAL | Status: DC | PRN

## 2013-10-09 NOTE — Lactation Note (Signed)
This note was copied from the chart of Sherri Castaneda. Lactation Consultation Note [facility] resources given and discussed.  Encouraged to feed with early cues on demand.  Early newborn behavior discussed.  Hand expression demonstrated with colostrum visible. Mom reported seeing blood from left nipple, but not at this time.  Center of nipple is pink, EBM applied.  Comfort gels given with instructions due to mom's complains of sore nipple.  Baby asleep and last feeding was 1 hours ago.  Encouraged good deep latch and to call for assist as needed. Mom reports feeding her last baby about 2 weeks with nipple pain.   Patient Name: Sherri Geoffrey Mankin [id number] Date: 10/09/2013 Reason for consult: Initial assessment   Maternal Data Has patient been taught Hand Expression?: Yes Does the patient have breastfeeding experience prior to this delivery?: Yes  Feeding Feeding Type: Bottle Fed - Formula Length of feed: 20 min  LATCH Score/Interventions                Intervention(s): Breastfeeding basics reviewed     Lactation Tools Discussed/Used Tools: Comfort gels   Consult Status Consult Status: Follow-up Date: 10/10/13 Follow-up type: In-patient    Kendell Bane Justine Null 10/09/2013, 10:33 PM

## 2013-10-09 NOTE — H&P (Signed)
Sherri Castaneda is a 28 y.o. female presenting with contractions. Maternal Medical History:  Reason for admission: Contractions.   Contractions: Onset was 6-12 hours ago.   Frequency: regular.   Perceived severity is strong.    Fetal activity: Perceived fetal activity is normal.    Prenatal complications: no prenatal complications Prenatal Complications - Diabetes: none.    OB History   Grav Para Term Preterm Abortions TAB SAB Ect Mult Living   4 3 1 2      3      Past Medical History  Diagnosis Date  . Seasonal allergies   . Preterm labor     Delivery at 32 weeks and 34 weeks.   Past Surgical History  Procedure Laterality Date  . Gynecologic cryosurgery  2006   Family History: family history includes Cancer in her father, maternal grandmother, and mother. Social History:  reports that she has never smoked. She does not have any smokeless tobacco history on file. She reports that she does not drink alcohol or use illicit drugs.     Review of Systems  Constitutional: Negative for fever.  Eyes: Negative for blurred vision.  Respiratory: Negative for shortness of breath.   Gastrointestinal: Negative for vomiting.  Skin: Negative for rash.  Neurological: Negative for headaches.    Dilation: 8 Effacement (%): 100 Station: 0 Exam by:: E Hamptom RN Blood pressure 134/71, pulse 79, temperature 98.6 F (37 C), temperature source Oral, resp. rate 20, height 5' 0.8" (1.544 m), weight 77.565 kg (171 lb), last menstrual period 01/06/2013, SpO2 100.00%. Maternal Exam:  Uterine Assessment: Contraction frequency is regular.   Abdomen: not evaluated.  Introitus: Normal vulva. Amniotic fluid character: meconium stained.  Cervix: Cervix evaluated by digital exam.     Fetal Exam Fetal Monitor Review: Baseline rate: 140.  Variability: moderate (6-25 bpm).   Pattern: accelerations present.    Fetal State Assessment: Category I - tracings are normal.     Physical Exam   Prenatal labs: ABO, Rh: O/POS/-- (11/17 1649) Antibody: NEG (11/17 1649) Rubella: 8.71 (11/17 1649) RPR: NON REAC (11/17 1649)  HBsAg: NEGATIVE (11/17 1649)  HIV: NON REACTIVE (11/17 1649)  GBS: Detected (05/18 1716)   Assessment/Plan: Multipara @ [redacted]w[redacted]d.  Active labor--delivery imminent.  Meconium staining.  Category I FHT.  GBS positive.  Admit Epidural Ampicillin for GBS prophylaxis Anticipate NSVD   JACKSON-MOORE,Lonn Im A 10/09/2013, 9:30 AM

## 2013-10-09 NOTE — Progress Notes (Signed)
Forebag ruptured

## 2013-10-09 NOTE — MAU Note (Signed)
Pt c/o uc every 4-5 mins. Denies LOF or vag bleeding. +FM.

## 2013-10-09 NOTE — Anesthesia Procedure Notes (Signed)
Epidural Patient location during procedure: OB Start time: 10/09/2013 9:12 AM  Staffing Anesthesiologist: Levetta Bognar A. Performed by: anesthesiologist   Preanesthetic Checklist Completed: patient identified, site marked, surgical consent, pre-op evaluation, timeout performed, IV checked, risks and benefits discussed and monitors and equipment checked  Epidural Patient position: sitting Prep: site prepped and draped and DuraPrep Patient monitoring: continuous pulse ox and blood pressure Approach: midline Location: L3-L4 Injection technique: LOR air  Needle:  Needle type: Tuohy  Needle gauge: 17 G Needle length: 9 cm and 9 Needle insertion depth: 4 cm Catheter type: closed end flexible Catheter size: 19 Gauge Catheter at skin depth: 9 cm Test dose: negative and Other  Assessment Events: blood not aspirated, injection not painful, no injection resistance, negative IV test and no paresthesia  Additional Notes Patient identified. Risks and benefits discussed including failed block, incomplete  Pain control, post dural puncture headache, nerve damage, paralysis, blood pressure Changes, nausea, vomiting, reactions to medications-both toxic and allergic and post Partum back pain. All questions were answered. Patient expressed understanding and wished to proceed. Sterile technique was used throughout procedure. Epidural site was Dressed with sterile barrier dressing. No paresthesias, signs of intravascular injection Or signs of intrathecal spread were encountered.  Patient was more comfortable after the epidural was dosed. Please see RN's note for documentation of vital signs and FHR which are stable.

## 2013-10-09 NOTE — Anesthesia Preprocedure Evaluation (Signed)
Anesthesia Evaluation  Patient identified by MRN, date of birth, ID band Patient awake    Reviewed: Allergy & Precautions, H&P , Patient's Chart, lab work & pertinent test results  Airway Mallampati: II TM Distance: >3 FB Neck ROM: full    Dental no notable dental hx. (+) Teeth Intact   Pulmonary neg pulmonary ROS,  breath sounds clear to auscultation  Pulmonary exam normal       Cardiovascular negative cardio ROS  Rhythm:regular Rate:Normal     Neuro/Psych negative neurological ROS  negative psych ROS   GI/Hepatic negative GI ROS, Neg liver ROS,   Endo/Other  Obesity  Renal/GU negative Renal ROS  negative genitourinary   Musculoskeletal   Abdominal Normal abdominal exam  (+)   Peds  Hematology negative hematology ROS (+)   Anesthesia Other Findings   Reproductive/Obstetrics (+) Pregnancy                           Anesthesia Physical Anesthesia Plan  ASA: II  Anesthesia Plan: Epidural   Post-op Pain Management:    Induction:   Airway Management Planned:   Additional Equipment:   Intra-op Plan:   Post-operative Plan:   Informed Consent: I have reviewed the patients History and Physical, chart, labs and discussed the procedure including the risks, benefits and alternatives for the proposed anesthesia with the patient or authorized representative who has indicated his/her understanding and acceptance.     Plan Discussed with: Anesthesiologist  Anesthesia Plan Comments:         Anesthesia Quick Evaluation

## 2013-10-10 NOTE — Progress Notes (Signed)
Post Partum Day 1 Subjective: no complaints  Objective: Blood pressure 110/62, pulse 61, temperature 97.9 F (36.6 C), temperature source Oral, resp. rate 16, height 5' 0.5" (1.537 m), weight 171 lb (77.565 kg), last menstrual period 01/06/2013, SpO2 96.00%, unknown if currently breastfeeding.  Physical Exam:  General: alert and no distress Lochia: appropriate Uterine Fundus: firm Incision: none DVT Evaluation: No evidence of DVT seen on physical exam.   Recent Labs  10/09/13 0755  HGB 10.8*  HCT 34.2*    Assessment/Plan: Plan for discharge tomorrow   LOS: 1 day   Julann Mcgilvray A 10/10/2013, 7:41 AM

## 2013-10-10 NOTE — Progress Notes (Signed)
Patient ID: Sherri Castaneda, female   DOB: 01-04-1986, 28 y.o.   MRN: [id number] Post Partum Day 1 S/P spontaneous vaginal RH status/Rubella reviewed.  Feeding: breast Subjective: No HA, SOB, CP, F/C, breast symptoms. Normal vaginal bleeding, no clots.     Objective: BP 110/62  Pulse 61  Temp(Src) 97.9 F (36.6 C) (Oral)  Resp 16  Ht 5' 0.5" (1.537 m)  Wt 77.565 kg (171 lb)  BMI 32.83 kg/m2  SpO2 96%  LMP 01/06/2013  Breastfeeding? Unknown   Physical Exam:  General: alert Lochia: appropriate Uterine Fundus: firm DVT Evaluation: No evidence of DVT seen on physical exam. Ext: No c/c/e  Recent Labs  10/09/13 0755  HGB 10.8*  HCT 34.2*      Assessment/Plan: 28 y.o.  PPD #1 .  normal postpartum exam Continue current postpartum care Ambulate   LOS: 1 day   JACKSON-MOORE,Antuan Limes A 10/10/2013, 8:07 AM

## 2013-10-10 NOTE — Anesthesia Postprocedure Evaluation (Signed)
  Anesthesia Post-op Note  Patient: Sherri Castaneda  Procedure(s) Performed: * No procedures listed *  Patient Location: Mother/Baby  Anesthesia Type:Epidural  Level of Consciousness: awake, alert , oriented and patient cooperative  Airway and Oxygen Therapy: Patient Spontanous Breathing  Post-op Pain: mild  Post-op Assessment: Patient's Cardiovascular Status Stable, Respiratory Function Stable, No headache, No backache, No residual numbness and No residual motor weakness  Post-op Vital Signs: stable  Last Vitals:  Filed Vitals:   10/10/13 0550  BP: 110/62  Pulse: 61  Temp: 36.6 C  Resp: 16    Complications: No apparent anesthesia complications

## 2013-10-11 NOTE — Discharge Summary (Signed)
  Obstetric Discharge Summary Reason for Admission: onset of labor Prenatal Procedures: none Intrapartum Procedures: spontaneous vaginal delivery Postpartum Procedures: none Complications-Operative and Postpartum: none  Hemoglobin  Date Value Ref Range Status  10/09/2013 10.8* 12.0 - 15.0 g/dL Final     HCT  Date Value Ref Range Status  10/09/2013 34.2* 36.0 - 46.0 % Final    Physical Exam:  General: alert Lochia: appropriate Uterine: firm Incision: n/a DVT Evaluation: No evidence of DVT seen on physical exam.  Discharge Diagnoses: Active Problems:   Active labor   Normal delivery   Discharge Information: Date: 10/11/2013 Activity: pelvic rest Diet: routine Medications:  Prior to Admission medications   Medication Sig Start Date End Date Taking? Authorizing Provider  ondansetron (ZOFRAN) 4 MG tablet Take 1 tablet (4 mg total) by mouth every 8 (eight) hours as needed for nausea. 06/11/13  Yes Lahoma Crocker, MD  pantoprazole (PROTONIX) 40 MG tablet Take 1 tablet (40 mg total) by mouth daily. 06/26/13  Yes Lahoma Crocker, MD  Prenat-FeCbn-FeAspGl-FA-Omega (OB COMPLETE PETITE) 35-5-1-200 MG CAPS Take 1 capsule by mouth daily. 05/06/13  Yes Lahoma Crocker, MD    Condition: stable Instructions: refer to routine discharge instructions Discharge to: home Follow-up Information   Follow up with Lahoma Crocker A, MD. Schedule an appointment as soon as possible for a visit in 3 weeks.   Specialty:  Obstetrics and Gynecology   Contact information:   [address] [place] [address] [phone]       Newborn Data:  Live born female  Birth Weight: 7 lb 7.9 oz (3400 g) APGAR: 9, 9   Home with mother.  Sherri Castaneda,Sherri Castaneda A 10/11/2013, 8:19 AM

## 2013-10-11 NOTE — Discharge Instructions (Signed)

## 2013-10-14 ENCOUNTER — Encounter: Payer: BC Managed Care – PPO | Admitting: Obstetrics & Gynecology

## 2013-11-18 ENCOUNTER — Ambulatory Visit (INDEPENDENT_AMBULATORY_CARE_PROVIDER_SITE_OTHER): Payer: BC Managed Care – PPO | Admitting: Obstetrics & Gynecology

## 2013-11-18 ENCOUNTER — Encounter: Payer: Self-pay | Admitting: Obstetrics & Gynecology

## 2013-11-18 DIAGNOSIS — Z3202 Encounter for pregnancy test, result negative: Secondary | ICD-10-CM

## 2013-11-18 DIAGNOSIS — Z30017 Encounter for initial prescription of implantable subdermal contraceptive: Secondary | ICD-10-CM

## 2013-11-18 MED ORDER — ETONOGESTREL 68 MG ~~LOC~~ IMPL
68.0000 mg | DRUG_IMPLANT | Freq: Once | SUBCUTANEOUS | Status: AC
  Administered 2013-11-18: 68 mg via SUBCUTANEOUS

## 2013-11-18 NOTE — Progress Notes (Signed)
Patient ID: Sherri Castaneda, female   DOB: January 30, 1986, 28 y.o.   MRN: [id number] Subjective:     Sherri Castaneda is a 28 y.o. female who presents for a postpartum visit. She is 6 weeks postpartum following a spontaneous vaginal delivery. I have fully reviewed the prenatal and intrapartum course. The delivery was at term. Outcome: spontaneous vaginal delivery. Anesthesia: epidural. Postpartum course has been uncomplicated. Baby's course has been unremarkable. Baby is feeding by bottle. Bleeding no bleeding. Bowel function is normal. Bladder function is normal. Patient is not sexually active. Contraception method is abstinence. Postpartum depression screening: negative.  Tobacco, alcohol and substance abuse history reviewed.  Adult immunizations reviewed including TDAP, rubella and varicella.     Review of Systems Pertinent items are noted in HPI.   Objective:    BP 122/79  Pulse 80  Temp(Src) 98.6 F (37 C)  Wt 69.4 kg (153 lb)  LMP 11/06/2013  Breastfeeding? No        General:   alert  Skin:   no rash or abnormalities  Lungs:   clear to auscultation bilaterally  Heart:   regular rate and rhythm, S1, S2 normal, no murmur, click, rub or gallop  Abdomen:  normal findings: no organomegaly, soft, non-tender and no hernia  Pelvis:  External genitalia: normal general appearance Urinary system: urethral meatus normal and bladder without fullness, nontender Vaginal: normal without tenderness, induration or masses Cervix: normal appearance Adnexa: normal bimanual exam Uterus: anteverted and non-tender, normal size   NEXPLANON INSERTION NOTE   Contraception used: Abstinence  Indications:  The patient desires contraception.  She understands risks, benefits, and alternatives to Implanon and would like to proceed.  Anesthesia:   Lidocaine 1% plain.  Procedure:  A time-out was performed confirming the procedure and the patient's allergy status.  The patient's non-dominant was identified as the  left arm.  The protection cap was removed. While placing countertraction on the skin, the needle was inserted at a 30 degree angle.  The applicator was held horizontal to the skin; the skin was tented upward as the needle was introduced into the subdermal space.  While holding the applicator in place, the slider was unlocked. The Nexplanon was removed from the field.  The Nexplanon was palpated to ensure proper placement.  Complications: None  Instructions:  The patient was instructed to remove the dressing in 24 hours and that some bruising is to be expected.  She was advised to use over the counter analgesics as needed for any pain at the site.  She is to keep the area dry for 24 hours and to call if her hand or arm becomes cold, numb, or blue.  Return visit:  Return prn  Assessment:     Normal postpartum exam. Pap smear done at today's visit.  S/P Nexplanon insertion Plan:     Contraception: Nexplanon  Follow up in: 1 year or as needed.

## 2013-11-18 NOTE — Patient Instructions (Signed)
Health Maintenance, Female A healthy lifestyle and preventative care can promote health and wellness.  Maintain regular health, dental, and eye exams.  Eat a healthy diet. Foods like vegetables, fruits, whole grains, low-fat dairy products, and lean protein foods contain the nutrients you need without too many calories. Decrease your intake of foods high in solid fats, added sugars, and salt. Get information about a proper diet from your caregiver, if necessary.  Regular physical exercise is one of the most important things you can do for your health. Most adults should get at least 150 minutes of moderate-intensity exercise (any activity that increases your heart rate and causes you to sweat) each week. In addition, most adults need muscle-strengthening exercises on 2 or more days a week.   Maintain a healthy weight. The body mass index (BMI) is a screening tool to identify possible weight problems. It provides an estimate of body fat based on height and weight. Your caregiver can help determine your BMI, and can help you achieve or maintain a healthy weight. For adults 20 years and older:  A BMI below 18.5 is considered underweight.  A BMI of 18.5 to 24.9 is normal.  A BMI of 25 to 29.9 is considered overweight.  A BMI of 30 and above is considered obese.  Maintain normal blood lipids and cholesterol by exercising and minimizing your intake of saturated fat. Eat a balanced diet with plenty of fruits and vegetables. Blood tests for lipids and cholesterol should begin at age 41 and be repeated every 5 years. If your lipid or cholesterol levels are high, you are over 50, or you are a high risk for heart disease, you may need your cholesterol levels checked more frequently.Ongoing high lipid and cholesterol levels should be treated with medicines if diet and exercise are not effective.  If you smoke, find out from your caregiver how to quit. If you do not use tobacco, do not start.  Lung  cancer screening is recommended for adults aged 66-80 years who are at high risk for developing lung cancer because of a history of smoking. Yearly low-dose computed tomography (CT) is recommended for people who have at least a 30-pack-year history of smoking and are a current smoker or have quit within the past 15 years. A pack year of smoking is smoking an average of 1 pack of cigarettes a day for 1 year (for example: 1 pack a day for 30 years or 2 packs a day for 15 years). Yearly screening should continue until the smoker has stopped smoking for at least 15 years. Yearly screening should also be stopped for people who develop a health problem that would prevent them from having lung cancer treatment.  If you are pregnant, do not drink alcohol. If you are breastfeeding, be very cautious about drinking alcohol. If you are not pregnant and choose to drink alcohol, do not exceed 1 drink per day. One drink is considered to be 12 ounces (355 mL) of beer, 5 ounces (148 mL) of wine, or 1.5 ounces (44 mL) of liquor.  Avoid use of street drugs. Do not share needles with anyone. Ask for help if you need support or instructions about stopping the use of drugs.  High blood pressure causes heart disease and increases the risk of stroke. Blood pressure should be checked at least every 1 to 2 years. Ongoing high blood pressure should be treated with medicines, if weight loss and exercise are not effective.  If you are 55 to 29  years old, ask your caregiver if you should take aspirin to prevent strokes.  Diabetes screening involves taking a blood sample to check your fasting blood sugar level. This should be done once every 3 years, after age 44, if you are within normal weight and without risk factors for diabetes. Testing should be considered at a younger age or be carried out more frequently if you are overweight and have at least 1 risk factor for diabetes.  Breast cancer screening is essential preventative care  for women. You should practice "breast self-awareness." This means understanding the normal appearance and feel of your breasts and may include breast self-examination. Any changes detected, no matter how small, should be reported to a caregiver. Women in their 1s and 30s should have a clinical breast exam (CBE) by a caregiver as part of a regular health exam every 1 to 3 years. After age 56, women should have a CBE every year. Starting at age 55, women should consider having a mammogram (breast X-ray) every year. Women who have a family history of breast cancer should talk to their caregiver about genetic screening. Women at a high risk of breast cancer should talk to their caregiver about having an MRI and a mammogram every year.  Breast cancer gene (BRCA)-related cancer risk assessment is recommended for women who have family members with BRCA-related cancers. BRCA-related cancers include breast, ovarian, tubal, and peritoneal cancers. Having family members with these cancers may be associated with an increased risk for harmful changes (mutations) in the breast cancer genes BRCA1 and BRCA2. Results of the assessment will determine the need for genetic counseling and BRCA1 and BRCA2 testing.  The Pap test is a screening test for cervical cancer. Women should have a Pap test starting at age 61. Between ages 5 and 66, Pap tests should be repeated every 2 years. Beginning at age 74, you should have a Pap test every 3 years as long as the past 3 Pap tests have been normal. If you had a hysterectomy for a problem that was not cancer or a condition that could lead to cancer, then you no longer need Pap tests. If you are between ages 56 and 22, and you have had normal Pap tests going back 10 years, you no longer need Pap tests. If you have had past treatment for cervical cancer or a condition that could lead to cancer, you need Pap tests and screening for cancer for at least 20 years after your treatment. If Pap  tests have been discontinued, risk factors (such as a new sexual partner) need to be reassessed to determine if screening should be resumed. Some women have medical problems that increase the chance of getting cervical cancer. In these cases, your caregiver may recommend more frequent screening and Pap tests.  The human papillomavirus (HPV) test is an additional test that may be used for cervical cancer screening. The HPV test looks for the virus that can cause the cell changes on the cervix. The cells collected during the Pap test can be tested for HPV. The HPV test could be used to screen women aged 64 years and older, and should be used in women of any age who have unclear Pap test results. After the age of 35, women should have HPV testing at the same frequency as a Pap test.  Colorectal cancer can be detected and often prevented. Most routine colorectal cancer screening begins at the age of 37 and continues through age 10. However, your caregiver may  recommend screening at an earlier age if you have risk factors for colon cancer. On a yearly basis, your caregiver may provide home test kits to check for hidden blood in the stool. Use of a small camera at the end of a tube, to directly examine the colon (sigmoidoscopy or colonoscopy), can detect the earliest forms of colorectal cancer. Talk to your caregiver about this at age 31, when routine screening begins. Direct examination of the colon should be repeated every 5 to 10 years through age 73, unless early forms of pre-cancerous polyps or small growths are found.  Hepatitis C blood testing is recommended for all people born from [birth year] through 1966 and any individual with known risks for hepatitis C.  Practice safe sex. Use condoms and avoid high-risk sexual practices to reduce the spread of sexually transmitted infections (STIs). Sexually active women aged 9 and younger should be checked for Chlamydia, which is a common sexually transmitted infection.  Older women with new or multiple partners should also be tested for Chlamydia. Testing for other STIs is recommended if you are sexually active and at increased risk.  Osteoporosis is a disease in which the bones lose minerals and strength with aging. This can result in serious bone fractures. The risk of osteoporosis can be identified using a bone density scan. Women ages 34 and over and women at risk for fractures or osteoporosis should discuss screening with their caregivers. Ask your caregiver whether you should be taking a calcium supplement or vitamin D to reduce the rate of osteoporosis.  Menopause can be associated with physical symptoms and risks. Hormone replacement therapy is available to decrease symptoms and risks. You should talk to your caregiver about whether hormone replacement therapy is right for you.  Use sunscreen. Apply sunscreen liberally and repeatedly throughout the day. You should seek shade when your shadow is shorter than you. Protect yourself by wearing long sleeves, pants, a wide-brimmed hat, and sunglasses year round, whenever you are outdoors.  Notify your caregiver of new moles or changes in moles, especially if there is a change in shape or color. Also notify your caregiver if a mole is larger than the size of a pencil eraser.  Stay current with your immunizations. Document Released: 11/01/2010 Document Revised: 08/13/2012 Document Reviewed: 03/20/2013 [facility] Patient Information 2015 [place], [place]. This information is not intended to replace advice given to you by your health care provider. Make sure you discuss any questions you have with your health care provider. Etonogestrel implant What is this medicine? ETONOGESTREL (et oh noe JES trel) is a contraceptive (birth control) device. It is used to prevent pregnancy. It can be used for up to 3 years. This medicine may be used for other purposes; ask your health care provider or pharmacist if you have  questions. COMMON BRAND NAME(S): Implanon, Nexplanon What should I tell my health care provider before I take this medicine? They need to know if you have any of these conditions: -abnormal vaginal bleeding -blood vessel disease or blood clots -cancer of the breast, cervix, or liver -depression -diabetes -gallbladder disease -headaches -heart disease or recent heart attack -high blood pressure -high cholesterol -kidney disease -liver disease -renal disease -seizures -tobacco smoker -an unusual or allergic reaction to etonogestrel, other hormones, anesthetics or antiseptics, medicines, foods, dyes, or preservatives -pregnant or trying to get pregnant -breast-feeding How should I use this medicine? This device is inserted just under the skin on the inner side of your upper arm by a health care  professional. Talk to your pediatrician regarding the use of this medicine in children. Special care may be needed. Overdosage: If you think you've taken too much of this medicine contact a poison control center or emergency room at once. Overdosage: If you think you have taken too much of this medicine contact a poison control center or emergency room at once. NOTE: This medicine is only for you. Do not share this medicine with others. What if I miss a dose? This does not apply. What may interact with this medicine? Do not take this medicine with any of the following medications: -amprenavir -bosentan -fosamprenavir This medicine may also interact with the following medications: -barbiturate medicines for inducing sleep or treating seizures -certain medicines for fungal infections like ketoconazole and itraconazole -griseofulvin -medicines to treat seizures like carbamazepine, felbamate, oxcarbazepine, phenytoin, topiramate -modafinil -phenylbutazone -rifampin -some medicines to treat HIV infection like atazanavir, indinavir, lopinavir, nelfinavir, tipranavir, ritonavir -[facility]. John's  wort This list may not describe all possible interactions. Give your health care provider a list of all the medicines, herbs, non-prescription drugs, or dietary supplements you use. Also tell them if you smoke, drink alcohol, or use illegal drugs. Some items may interact with your medicine. What should I watch for while using this medicine? This product does not protect you against HIV infection (AIDS) or other sexually transmitted diseases. You should be able to feel the implant by pressing your fingertips over the skin where it was inserted. Tell your doctor if you cannot feel the implant. What side effects may I notice from receiving this medicine? Side effects that you should report to your doctor or health care professional as soon as possible: -allergic reactions like skin rash, itching or hives, swelling of the face, lips, or tongue -breast lumps -changes in vision -confusion, trouble speaking or understanding -dark urine -depressed mood -general ill feeling or flu-like symptoms -light-colored stools -loss of appetite, nausea -right upper belly pain -severe headaches -severe pain, swelling, or tenderness in the abdomen -shortness of breath, chest pain, swelling in a leg -signs of pregnancy -sudden numbness or weakness of the face, arm or leg -trouble walking, dizziness, loss of balance or coordination -unusual vaginal bleeding, discharge -unusually weak or tired -yellowing of the eyes or skin Side effects that usually do not require medical attention (Report these to your doctor or health care professional if they continue or are bothersome.): -acne -breast pain -changes in weight -cough -fever or chills -headache -irregular menstrual bleeding -itching, burning, and vaginal discharge -pain or difficulty passing urine -sore throat This list may not describe all possible side effects. Call your doctor for medical advice about side effects. You may report side effects to FDA  at 1-800-FDA-1088. Where should I keep my medicine? This drug is given in a hospital or clinic and will not be stored at home. NOTE: This sheet is a summary. It may not cover all possible information. If you have questions about this medicine, talk to your doctor, pharmacist, or health care provider.  2015, [employer]. (2011-10-24 15:37:45)

## 2013-11-18 NOTE — Addendum Note (Signed)
Addended by: Ladona Ridgel on: 11/18/2013 05:52 PM   Modules accepted: Orders

## 2013-11-18 NOTE — Addendum Note (Signed)
Addended by: Lewie Loron D on: 11/18/2013 05:56 PM   Modules accepted: Orders

## 2013-11-19 LAB — PAP IG W/ RFLX HPV ASCU

## 2013-11-25 LAB — POCT URINE PREGNANCY: PREG TEST UR: NEGATIVE

## 2013-11-25 NOTE — Addendum Note (Signed)
Addended by: Valli Glance F on: 11/25/2013 06:09 PM   Modules accepted: Orders

## 2014-03-03 ENCOUNTER — Encounter: Payer: Self-pay | Admitting: Obstetrics & Gynecology

## 2014-04-28 ENCOUNTER — Encounter: Payer: Self-pay | Admitting: *Deleted

## 2014-04-29 ENCOUNTER — Encounter: Payer: Self-pay | Admitting: Obstetrics & Gynecology

## 2014-05-14 ENCOUNTER — Ambulatory Visit (INDEPENDENT_AMBULATORY_CARE_PROVIDER_SITE_OTHER): Payer: Self-pay | Admitting: Family Medicine

## 2014-05-14 VITALS — BP 128/82 | HR 109 | Temp 98.5°F | Resp 18 | Ht 60.5 in | Wt 154.0 lb

## 2014-05-14 DIAGNOSIS — R208 Other disturbances of skin sensation: Secondary | ICD-10-CM

## 2014-05-14 DIAGNOSIS — R109 Unspecified abdominal pain: Secondary | ICD-10-CM

## 2014-05-14 DIAGNOSIS — M2550 Pain in unspecified joint: Secondary | ICD-10-CM

## 2014-05-14 DIAGNOSIS — Z975 Presence of (intrauterine) contraceptive device: Secondary | ICD-10-CM

## 2014-05-14 LAB — POCT URINALYSIS DIPSTICK
Bilirubin, UA: NEGATIVE
Blood, UA: NEGATIVE
GLUCOSE UA: NEGATIVE
KETONES UA: NEGATIVE
Nitrite, UA: NEGATIVE
Protein, UA: NEGATIVE
Urobilinogen, UA: 0.2
pH, UA: 6

## 2014-05-14 LAB — POCT UA - MICROSCOPIC ONLY
Casts, Ur, LPF, POC: NEGATIVE
Crystals, Ur, HPF, POC: NEGATIVE
Mucus, UA: NEGATIVE
Yeast, UA: NEGATIVE

## 2014-05-14 MED ORDER — MELOXICAM 7.5 MG PO TABS: 7.5000 mg | ORAL_TABLET | Freq: Every day | ORAL | Status: DC

## 2014-05-14 NOTE — Patient Instructions (Signed)
Try either over the counter ibuprofen OR Meloxicam as prescribed if needed for pains. If not improving in next few weeks, can discuss Nexplanon as possible cause with your OBGYN.  Stretches of upper and lower body each day, see exercises and information in the back care manual.  Return to the clinic or go to the nearest emergency room if any of your symptoms worsen or new symptoms occur.

## 2014-05-14 NOTE — Progress Notes (Signed)
Subjective:    Patient ID: Sherri Castaneda, female    DOB: [birth year], 29 y.o.   MRN: [id number] This chart was scribed for Sherri Ray, MD by Marti Sleigh, Medical Scribe. This patient was seen in Room 2 and the patient's care was started a 8:07 PM.  Chief Complaint  Patient presents with  . Flank Pain    left   . Abdominal Pain    lower   . Back Pain    HPI HPI Comments: Sherri Castaneda is a 29 y.o. female who presents to [facility] complaining of recently worsened intermittent body pains, worse in hips, back and legs. Pt also endorses pain in legs radiating down to feet, as well as hands and arms with associated tingling in bilateral hands.Pt states she has had a nexplanon birth control implant since July, 2015, after her last birth. Pt states her pain has been intermittent since that time. Pt states she has had four children and received an epidural with each, and her pain is at times in the area of the epidural, but may areas. Pt states that during her last pregnancy her child was putting pressure on a nerve in her left leg, and states that the pain in her legs reminds her of that pain. Pt's follow up appointment with her OBGYN is in February.  Pt also states that she got a new job in October, and she is standing and walking on concrete floors all day. Pt takes tylenol with some relief of sx. Pt states she is 25 pounds heavier than she was before her last pregnancy.  Pt also states that she experienced hair loss when she initially got her nexplanon implant.    Patient Active Problem List   Diagnosis Date Noted  . Active labor 10/09/2013  . Normal delivery 10/09/2013  . Pregnancy related symphysis pain in third trimester, antepartum 09/25/2013  . GBS (group B Streptococcus carrier), +RV culture, currently pregnant 09/21/2013  . Supervision of other normal pregnancy 03/18/2013  . Pregnancy related nausea and vomiting, antepartum 03/18/2013  . Previous preterm delivery, antepartum  03/18/2013   Past Medical History  Diagnosis Date  . Seasonal allergies   . Preterm labor     Delivery at 32 weeks and 34 weeks.   Past Surgical History  Procedure Laterality Date  . Gynecologic cryosurgery  2006   Allergies  Allergen Reactions  . Flagyl [Metronidazole] Swelling  . Pineapple Itching and Swelling  . Latex Itching and Rash   Prior to Admission medications   Medication Sig Start Date End Date Taking? Authorizing Provider  etonogestrel (NEXPLANON) 68 MG IMPL implant 1 each by Subdermal route once.   Yes Historical Provider, MD  Prenat-FeCbn-FeAspGl-FA-Omega (OB COMPLETE PETITE) 35-5-1-200 MG CAPS Take 1 capsule by mouth daily. Patient not taking: Reported on 05/14/2014 05/06/13   Lahoma Crocker, MD   History   Social History  . Marital Status: Married    Spouse Name: N/A    Number of Children: N/A  . Years of Education: N/A   Occupational History  . Not on file.   Social History Main Topics  . Smoking status: Never Smoker   . Smokeless tobacco: Not on file  . Alcohol Use: No  . Drug Use: No  . Sexual Activity:    Partners: Male    Birth Control/ Protection: None   Other Topics Concern  . Not on file   Social History Narrative    Review of Systems  HENT: Positive for congestion.  Respiratory: Positive for cough.   Gastrointestinal: Positive for abdominal pain.  Genitourinary: Positive for flank pain.  Musculoskeletal: Positive for back pain.  Skin:       Hair loss.       Objective:   Physical Exam  Constitutional: She is oriented to person, place, and time. She appears well-developed and well-nourished.  HENT:  Head: Normocephalic and atraumatic.  Eyes: Conjunctivae and EOM are normal. Pupils are equal, round, and reactive to light.  Neck: Neck supple. Carotid bruit is not present. No thyromegaly present.  Cardiovascular: Normal rate, regular rhythm, normal heart sounds and intact distal pulses.   Pulmonary/Chest: Effort normal and  breath sounds normal. No respiratory distress.  Abdominal: Soft. She exhibits no pulsatile midline mass. There is no tenderness.  Musculoskeletal:  No focal tenderness along spine, or paraspinal muscles. Describes pain as diffuse across back. No CVA tenderness. Full ROM of LS spine, but reproduction of left sided pain with left rotation, as well as with left lateral flexion. R/L hip full ROM no pain. No enlarged joints. NVI distally.  Lymphadenopathy:    She has no cervical adenopathy.  Neurological: She is alert and oriented to person, place, and time.  Patellar and achilles Reflexes 2+. Bebinski negaitve. Upper extremities with fill strength.  Negative tinel or Phalen. Cervical spine, full ROM no pain.  Skin: Skin is warm and dry.  Psychiatric: She has a normal mood and affect. Her behavior is normal.  Nursing note and vitals reviewed.   Filed Vitals:   05/14/14 1904  BP: 128/82  Pulse: 109  Temp: 98.5 F (36.9 C)  TempSrc: Oral  Resp: 18  Height: 5' 0.5" (1.537 m)  Weight: 154 lb (69.854 kg)  SpO2: 99%   Results for orders placed or performed in visit on 05/14/14  POCT urinalysis dipstick  Result Value Ref Range   Color, UA yellow    Clarity, UA clear    Glucose, UA neg    Bilirubin, UA neg    Ketones, UA neg    Spec Grav, UA <=1.005    Blood, UA neg    pH, UA 6.0    Protein, UA neg    Urobilinogen, UA 0.2    Nitrite, UA neg    Leukocytes, UA Trace   POCT UA - Microscopic Only  Result Value Ref Range   WBC, Ur, HPF, POC 1-3    RBC, urine, microscopic 0-2    Bacteria, U Microscopic trace    Mucus, UA neg    Epithelial cells, urine per micros 1-2    Crystals, Ur, HPF, POC neg    Casts, Ur, LPF, POC neg    Yeast, UA neg        Assessment & Plan:   TANAYIA WAHLQUIST is a 29 y.o. female Flank pain - Plan: POCT urinalysis dipstick, POCT UA - Microscopic Only  Dysesthesia affecting both sides of body - Plan: TSH  Arthralgia of multiple sites - Plan: meloxicam  (MOBIC) 7.5 MG tablet  Nexplanon in place  Diffuse arthralgias, without focal concern on exam. Possible myalgias/arthralgias with Nexplanon, but also suspect some of arthralgias with lifting children and possibly with new job.  Will check TSH with dysesthesias, trial of Mobic or otc ibuprofen, HEP by back care manual, and if not improving in next few weeks, consider discussing Nexplanon and alternatives with her OBGYN. Sooner if worse.   Meds ordered this encounter  Medications  . etonogestrel (NEXPLANON) 68 MG IMPL implant  Sig: 1 each by Subdermal route once.  . meloxicam (MOBIC) 7.5 MG tablet    Sig: Take 1 tablet (7.5 mg total) by mouth daily.    Dispense:  30 tablet    Refill:  0   Patient Instructions  Try either over the counter ibuprofen OR Meloxicam as prescribed if needed for pains. If not improving in next few weeks, can discuss Nexplanon as possible cause with your OBGYN.  Stretches of upper and lower body each day, see exercises and information in the back care manual.  Return to the clinic or go to the nearest emergency room if any of your symptoms worsen or new symptoms occur.      I personally performed the services described in this documentation, which was scribed in my presence. The recorded information has been reviewed and considered, and addended by me as needed.

## 2014-05-15 LAB — TSH: TSH: 1.179 u[IU]/mL (ref 0.350–4.500)

## 2014-05-17 ENCOUNTER — Encounter: Payer: Self-pay | Admitting: *Deleted

## 2014-05-29 ENCOUNTER — Ambulatory Visit: Payer: BC Managed Care – PPO | Admitting: Obstetrics & Gynecology

## 2014-06-05 ENCOUNTER — Ambulatory Visit: Payer: BC Managed Care – PPO | Admitting: Obstetrics

## 2014-11-20 ENCOUNTER — Ambulatory Visit: Payer: BC Managed Care – PPO | Admitting: Obstetrics & Gynecology

## 2015-12-31 ENCOUNTER — Other Ambulatory Visit (HOSPITAL_COMMUNITY)
Admission: RE | Admit: 2015-12-31 | Discharge: 2015-12-31 | Disposition: A | Discharge status: Home / Self Care | Payer: No Typology Code available for payment source | Source: Ambulatory Visit | Attending: Nurse Practitioner | Admitting: Nurse Practitioner

## 2015-12-31 ENCOUNTER — Other Ambulatory Visit: Payer: Self-pay | Admitting: Nurse Practitioner

## 2015-12-31 DIAGNOSIS — Z01419 Encounter for gynecological examination (general) (routine) without abnormal findings: Secondary | ICD-10-CM | POA: Insufficient documentation

## 2015-12-31 DIAGNOSIS — Z1151 Encounter for screening for human papillomavirus (HPV): Secondary | ICD-10-CM | POA: Diagnosis not present

## 2016-01-05 LAB — CYTOLOGY - PAP

## 2016-10-08 ENCOUNTER — Encounter (HOSPITAL_COMMUNITY): Payer: Self-pay

## 2016-10-08 ENCOUNTER — Emergency Department (HOSPITAL_COMMUNITY)
Admission: EM | Admit: 2016-10-08 | Discharge: 2016-10-08 | Disposition: A | Discharge status: Home / Self Care | Payer: BLUE CROSS/BLUE SHIELD | Attending: Emergency Medicine | Admitting: Emergency Medicine

## 2016-10-08 DIAGNOSIS — Z79899 Other long term (current) drug therapy: Secondary | ICD-10-CM | POA: Diagnosis not present

## 2016-10-08 DIAGNOSIS — J3489 Other specified disorders of nose and nasal sinuses: Secondary | ICD-10-CM | POA: Insufficient documentation

## 2016-10-08 DIAGNOSIS — R51 Headache: Secondary | ICD-10-CM | POA: Diagnosis present

## 2016-10-08 DIAGNOSIS — Z91040 Latex allergy status: Secondary | ICD-10-CM | POA: Diagnosis not present

## 2016-10-08 MED ORDER — ACETAMINOPHEN 325 MG PO TABS
650.0000 mg | ORAL_TABLET | Freq: Once | ORAL | Status: AC
  Administered 2016-10-08: 650 mg via ORAL
  Filled 2016-10-08: qty 2

## 2016-10-08 MED ORDER — BUTALBITAL-APAP-CAFFEINE 50-325-40 MG PO TABS: 1.0000 | ORAL_TABLET | Freq: Four times a day (QID) | ORAL | 0 refills | Status: DC | PRN

## 2016-10-08 NOTE — ED Triage Notes (Signed)
PT C/O RIGHT-SIDED FACIAL PAIN AND TENDERNESS SINCE YESTERDAY DUE TO HER SINUSES. PT STS SHE WAS CAUGHT IN THE RAIN TODAY,A ND NOW THE RIGHT EAR IS HURTING WITH A MUFFLED SOUND AND POPPING. PT STS SHE HAS BEEN TAKING OTC MEDICINE W/O RELIEF. DENIES N/V, OR FEVER.

## 2016-10-08 NOTE — ED Provider Notes (Signed)
[place] DEPT Provider Note   CSN: [id number] Arrival date & time: 10/08/16  2222     History   Chief Complaint Chief Complaint  Patient presents with  . Facial Pain    RIGHT  . Otalgia    RIGHT    HPI RAIGEN JAGIELSKI is a 31 y.o. female.  HPI   31 year old female with history of seasonal allergies presenting complaining of facial pain. Patient reports since yesterday she developed sharp throbbing pain primarily affecting her right ear, radiates towards her forehead and also involving the right sinus down to her jaw. She hears muffled sounds and popping sound in her right ear. Pain is moderate in intensity. She tries over-the-counter Tylenol, warm compress and rest with minimal improvement. She denies fever, severe headache, vision changes, ringing in ears, ear drainage, dental pain, controlled swallowing, neck pain, or rash. No nausea vomiting or diarrhea.  Past Medical History:  Diagnosis Date  . Preterm labor    Delivery at 32 weeks and 34 weeks.  . Seasonal allergies     Patient Active Problem List   Diagnosis Date Noted  . Active labor 10/09/2013  . Normal delivery 10/09/2013  . Pregnancy related symphysis pain in third trimester, antepartum 09/25/2013  . GBS (group B Streptococcus carrier), +RV culture, currently pregnant 09/21/2013  . Supervision of other normal pregnancy 03/18/2013  . Pregnancy related nausea and vomiting, antepartum 03/18/2013  . Previous preterm delivery, antepartum 03/18/2013    Past Surgical History:  Procedure Laterality Date  . GYNECOLOGIC CRYOSURGERY  2006    OB History    Gravida Para Term Preterm AB Living   4 4 2 2   4    SAB TAB Ectopic Multiple Live Births           4       Home Medications    Prior to Admission medications   Medication Sig Start Date End Date Taking? Authorizing Provider  etonogestrel (NEXPLANON) 68 MG IMPL implant 1 each by Subdermal route once.    [provider]  meloxicam (MOBIC) 7.5 MG  tablet Take 1 tablet (7.5 mg total) by mouth daily. 05/14/14   Wendie Agreste, MD  Prenat-FeCbn-FeAspGl-FA-Omega (OB COMPLETE PETITE) 35-5-1-200 MG CAPS Take 1 capsule by mouth daily. Patient not taking: Reported on 05/14/2014 05/06/13   Lahoma Crocker, MD    Family History Family History  Problem Relation Age of Onset  . Cancer Mother   . Cancer Maternal Grandmother   . Cancer Father     Social History Social History  Substance Use Topics  . Smoking status: Never Smoker  . Smokeless tobacco: Never Used  . Alcohol use No     Allergies   Flagyl [metronidazole]; Pineapple; and Latex   Review of Systems Review of Systems  Constitutional: Negative for fever.  HENT: Positive for ear pain and sinus pain. Negative for ear discharge.   Respiratory: Negative for cough and shortness of breath.      Physical Exam Updated Vital Signs BP (!) 139/95 (BP Location: Left Arm)   Pulse 82   Temp 98.1 F (36.7 C) (Oral)   Resp 18   Ht 5' (1.524 m)   Wt 70.3 kg (155 lb)   LMP 09/28/2016   SpO2 100%   BMI 30.27 kg/m   Physical Exam  Constitutional: She appears well-developed and well-nourished. No distress.  HENT:  Head: Atraumatic.  Right Ear: External ear normal.  Left Ear: External ear normal.  Nose: Nose normal.  Mouth/Throat:  Oropharynx is clear and moist. No oropharyngeal exudate.   tenderness to percussion of right maxillary sinus.  Eyes: Conjunctivae and EOM are normal. Pupils are equal, round, and reactive to light.  Neck: Normal range of motion. Neck supple.  No nuchal rigidity  Cardiovascular: Normal rate.   Pulmonary/Chest: Effort normal and breath sounds normal.  Neurological: She is alert.  Skin: No rash noted.  Psychiatric: She has a normal mood and affect.  Nursing note and vitals reviewed.    ED Treatments / Results  Labs (all labs ordered are listed, but only abnormal results are displayed) Labs Reviewed - No data to display  EKG  EKG  Interpretation None       Radiology No results found.  Procedures Procedures (including critical care time)  Medications Ordered in ED Medications - No data to display   Initial Impression / Assessment and Plan / ED Course  I have reviewed the triage vital signs and the nursing notes.  Pertinent labs & imaging results that were available during my care of the patient were reviewed by me and considered in my medical decision making (see chart for details).     BP (!) 139/95 (BP Location: Left Arm)   Pulse 82   Temp 98.1 F (36.7 C) (Oral)   Resp 18   Ht 5' (1.524 m)   Wt 70.3 kg (155 lb)   LMP 09/28/2016   SpO2 100%   BMI 30.27 kg/m    Final Clinical Impressions(s) / ED Diagnoses   Final diagnoses:  Sinus pain    New Prescriptions New Prescriptions   BUTALBITAL-ACETAMINOPHEN-CAFFEINE (FIORICET, ESGIC) 50-325-40 MG TABLET    Take 1-2 tablets by mouth every 6 (six) hours as needed for headache.   11:24 PM Pt here with R side sinus pain and ear pain.  Does have reproducible tenderness to percussion of R maxillary region.  However, no obvious evidence to suggest infection causing sxs.  Ear exam unremarkable.  No other concerning features.  Will provide sxs treatment, return precaution given. Doubt bacterial sinusitis requiring abx at this time.  Doubt otitis externa.  No hx of migraine.   Domenic Moras, PA-C 10/08/16 2326    Molpus, Jenny Reichmann, MD 10/08/16 2342

## 2016-11-24 ENCOUNTER — Encounter: Payer: Self-pay | Admitting: Obstetrics

## 2016-11-24 ENCOUNTER — Encounter: Payer: Self-pay | Admitting: *Deleted

## 2016-11-24 ENCOUNTER — Ambulatory Visit (INDEPENDENT_AMBULATORY_CARE_PROVIDER_SITE_OTHER): Payer: BLUE CROSS/BLUE SHIELD | Admitting: Obstetrics

## 2016-11-24 VITALS — BP 128/89 | HR 94 | Wt 158.4 lb

## 2016-11-24 DIAGNOSIS — Z3046 Encounter for surveillance of implantable subdermal contraceptive: Secondary | ICD-10-CM | POA: Diagnosis not present

## 2016-11-24 MED ORDER — ETONOGESTREL 68 MG ~~LOC~~ IMPL
68.0000 mg | DRUG_IMPLANT | Freq: Once | SUBCUTANEOUS | Status: AC
  Administered 2016-11-24: 68 mg via SUBCUTANEOUS

## 2016-11-24 NOTE — Progress Notes (Signed)
L NEXPLANON REMOVAL   Reasons  for removal:  Rod expired.   A timeout was performed confirming the patient, the procedure and allergy status. The patient's left  arm was palpated and the implant device located. The area was prepped with Betadinex3. The distal end of the device was palpated and 1 cc of 1% lidocaine was injected. A 1 mm incision was made. Any fibrotic tissue was carefully dissected away using blunt and/or sharp dissection. The device was removed in an intact manner. Steri-strips and a sterile dressing were applied to the incision. The patient tolerated the procedure well.  New contraceptive method: Continuing with a new Nexplanon      Nexplanon Procedure Note   PRE-OP DIAGNOSIS: desired long-term, reversible contraception ( LARC ) POST-OP DIAGNOSIS: Same  PROCEDURE: Nexplanon  placement Performing Provider: Shelly Bombard MD  Patient education prior to procedure, explained risk, benefits of Nexplanon, reviewed alternative options. Patient reported understanding. Gave consent to continue with procedure.   PROCEDURE:  Pregnancy Text :  Negative Site (check):      left arm         Sterile Preparation:   Betadinex3 Lot # [id number] Expiration Date 39 / 2020  Insertion site was selected 8 - 10 cm from medial epicondyle and marked along with guiding site using sterile marker. Procedure area was prepped and draped in a sterile fashion. 1% Lidocaine 1.5 ml given prior to procedure. Nexplanon  was inserted subcutaneously.Needle was removed from the insertion site. Nexplanon capsule was palpated by provider and patient to assure satisfactory placement. Dressing applied.  Followup: The patient tolerated the procedure well without complications.  Standard post-procedure care is explained and return precautions are given.  CHARLES . HARPER MD

## 2016-11-24 NOTE — Progress Notes (Signed)
Patient is in the office for Nexplanon removal and replacement.

## 2016-12-09 ENCOUNTER — Encounter: Payer: Self-pay | Admitting: Obstetrics

## 2016-12-09 ENCOUNTER — Ambulatory Visit (INDEPENDENT_AMBULATORY_CARE_PROVIDER_SITE_OTHER): Payer: BLUE CROSS/BLUE SHIELD | Admitting: Obstetrics

## 2016-12-09 VITALS — BP 122/84 | HR 78 | Wt 161.6 lb

## 2016-12-09 DIAGNOSIS — Z3046 Encounter for surveillance of implantable subdermal contraceptive: Secondary | ICD-10-CM | POA: Diagnosis not present

## 2016-12-09 NOTE — Progress Notes (Signed)
Patient is in the office for nexplanon follow up.

## 2016-12-09 NOTE — Progress Notes (Signed)
Subjective:    Sherri Castaneda is a 31 y.o. female who presents for follow up after Nexplanon removal and reinsertion. The patient has no complaints today. The patient is sexually active. Pertinent past medical history: none.  The information documented in the HPI was reviewed and verified.  Menstrual History: OB History    Gravida Para Term Preterm AB Living   4 4 2 2   4    SAB TAB Ectopic Multiple Live Births           4       No LMP recorded. Patient has had an implant.   Patient Active Problem List   Diagnosis Date Noted  . Active labor 10/09/2013  . Normal delivery 10/09/2013  . Pregnancy related symphysis pain in third trimester, antepartum 09/25/2013  . GBS (group B Streptococcus carrier), +RV culture, currently pregnant 09/21/2013  . Supervision of other normal pregnancy 03/18/2013  . Pregnancy related nausea and vomiting, antepartum 03/18/2013  . Previous preterm delivery, antepartum 03/18/2013   Past Medical History:  Diagnosis Date  . Preterm labor    Delivery at 32 weeks and 34 weeks.  . Seasonal allergies     Past Surgical History:  Procedure Laterality Date  . GYNECOLOGIC CRYOSURGERY  2006     Current Outpatient Prescriptions:  .  etonogestrel (NEXPLANON) 87 MG IMPL implant, 1 each by Subdermal route once., Disp: , Rfl:  Allergies  Allergen Reactions  . Flagyl [Metronidazole] Swelling  . Pineapple Itching and Swelling  . Latex Itching and Rash    Social History  Substance Use Topics  . Smoking status: Never Smoker  . Smokeless tobacco: Never Used  . Alcohol use No    Family History  Problem Relation Age of Onset  . Cancer Mother   . Cancer Maternal Grandmother   . Cancer Father        Review of Systems Constitutional: negative for weight loss Genitourinary:negative for abnormal menstrual periods and vaginal discharge   Objective:   BP 122/84   Pulse 78   Wt 161 lb 9.6 oz (73.3 kg)   BMI 31.56 kg/m    General:   alert  Skin:   no  rash or abnormalities  Lungs:   clear to auscultation bilaterally  Heart:   regular rate and rhythm, S1, S2 normal, no murmur, click, rub or gallop  Breasts:   normal without suspicious masses, skin or nipple changes or axillary nodes  Abdomen:  normal findings: no organomegaly, soft, non-tender and no hernia  Pelvis:  External genitalia: normal general appearance Urinary system: urethral meatus normal and bladder without fullness, nontender Vaginal: normal without tenderness, induration or masses Cervix: normal appearance Adnexa: normal bimanual exam Uterus: anteverted and non-tender, normal size   Lab Review Urine pregnancy test Labs reviewed yes Radiologic studies reviewed no  50% of 15 min visit spent on counseling and coordination of care.    Assessment:    31 y.o., continuing Nexplanon, no contraindications.   Plan:    All questions answered. Contraception: Nexplanon. Follow up as needed.  No orders of the defined types were placed in this encounter.  No orders of the defined types were placed in this encounter.

## 2016-12-29 ENCOUNTER — Telehealth: Payer: Self-pay

## 2016-12-29 NOTE — Telephone Encounter (Signed)
S/w pt and advised that provider stated that it may take a little longer for stitches to dissolve from nexplanon. Pt stated that she understood.

## 2017-09-04 ENCOUNTER — Encounter: Payer: Self-pay | Admitting: Obstetrics

## 2017-09-04 ENCOUNTER — Ambulatory Visit (INDEPENDENT_AMBULATORY_CARE_PROVIDER_SITE_OTHER): Payer: Managed Care, Other (non HMO) | Admitting: Obstetrics

## 2017-09-04 VITALS — BP 127/89 | HR 88 | Ht 60.0 in | Wt 150.0 lb

## 2017-09-04 DIAGNOSIS — Z1151 Encounter for screening for human papillomavirus (HPV): Secondary | ICD-10-CM

## 2017-09-04 DIAGNOSIS — K5901 Slow transit constipation: Secondary | ICD-10-CM | POA: Diagnosis not present

## 2017-09-04 DIAGNOSIS — K640 First degree hemorrhoids: Secondary | ICD-10-CM | POA: Diagnosis not present

## 2017-09-04 DIAGNOSIS — Z01419 Encounter for gynecological examination (general) (routine) without abnormal findings: Secondary | ICD-10-CM

## 2017-09-04 DIAGNOSIS — Z124 Encounter for screening for malignant neoplasm of cervix: Secondary | ICD-10-CM | POA: Diagnosis not present

## 2017-09-04 DIAGNOSIS — Z113 Encounter for screening for infections with a predominantly sexual mode of transmission: Secondary | ICD-10-CM | POA: Diagnosis not present

## 2017-09-04 MED ORDER — DOCUSATE SODIUM 100 MG PO CAPS: 100.0000 mg | ORAL_CAPSULE | Freq: Two times a day (BID) | ORAL | 11 refills | Status: DC

## 2017-09-04 MED ORDER — DICYCLOMINE HCL 20 MG PO TABS
20.0000 mg | ORAL_TABLET | Freq: Three times a day (TID) | ORAL | 2 refills | Status: DC
Start: 2017-09-04 — End: 2018-02-18

## 2017-09-04 NOTE — Patient Instructions (Signed)
Hemorrhoids Hemorrhoids are swollen veins in and around the rectum or anus. There are two types of hemorrhoids:  Internal hemorrhoids. These occur in the veins that are just inside the rectum. They may poke through to the outside and become irritated and painful.  External hemorrhoids. These occur in the veins that are outside of the anus and can be felt as a painful swelling or hard lump near the anus.  Most hemorrhoids do not cause serious problems, and they can be managed with home treatments such as diet and lifestyle changes. If home treatments do not help your symptoms, procedures can be done to shrink or remove the hemorrhoids. What are the causes? This condition is caused by increased pressure in the anal area. This pressure may result from various things, including:  Constipation.  Straining to have a bowel movement.  Diarrhea.  Pregnancy.  Obesity.  Sitting for long periods of time.  Heavy lifting or other activity that causes you to strain.  Anal sex.  What are the signs or symptoms? Symptoms of this condition include:  Pain.  Anal itching or irritation.  Rectal bleeding.  Leakage of stool (feces).  Anal swelling.  One or more lumps around the anus.  How is this diagnosed? This condition can often be diagnosed through a visual exam. Other exams or tests may also be done, such as:  Examination of the rectal area with a gloved hand (digital rectal exam).  Examination of the anal canal using a small tube (anoscope).  A blood test, if you have lost a significant amount of blood.  A test to look inside the colon (sigmoidoscopy or colonoscopy).  How is this treated? This condition can usually be treated at home. However, various procedures may be done if dietary changes, lifestyle changes, and other home treatments do not help your symptoms. These procedures can help make the hemorrhoids smaller or remove them completely. Some of these procedures involve  surgery, and others do not. Common procedures include:  Rubber band ligation. Rubber bands are placed at the base of the hemorrhoids to cut off the blood supply to them.  Sclerotherapy. Medicine is injected into the hemorrhoids to shrink them.  Infrared coagulation. A type of light energy is used to get rid of the hemorrhoids.  Hemorrhoidectomy surgery. The hemorrhoids are surgically removed, and the veins that supply them are tied off.  Stapled hemorrhoidopexy surgery. A circular stapling device is used to remove the hemorrhoids and use staples to cut off the blood supply to them.  Follow these instructions at home: Eating and drinking  Eat foods that have a lot of fiber in them, such as whole grains, beans, nuts, fruits, and vegetables. Ask your health care provider about taking products that have added fiber (fiber supplements).  Drink enough fluid to keep your urine clear or pale yellow. Managing pain and swelling  Take warm sitz baths for 20 minutes, 3-4 times a day to ease pain and discomfort.  If directed, apply ice to the affected area. Using ice packs between sitz baths may be helpful. ? Put ice in a plastic bag. ? Place a towel between your skin and the bag. ? Leave the ice on for 20 minutes, 2-3 times a day. General instructions  Take over-the-counter and prescription medicines only as told by your health care provider.  Use medicated creams or suppositories as told.  Exercise regularly.  Go to the bathroom when you have the urge to have a bowel movement. Do not wait.    Avoid straining to have bowel movements.  Keep the anal area dry and clean. Use wet toilet paper or moist towelettes after a bowel movement.  Do not sit on the toilet for long periods of time. This increases blood pooling and pain. Contact a health care provider if:  You have increasing pain and swelling that are not controlled by treatment or medicine.  You have uncontrolled bleeding.  You  have difficulty having a bowel movement, or you are unable to have a bowel movement.  You have pain or inflammation outside the area of the hemorrhoids. This information is not intended to replace advice given to you by your health care provider. Make sure you discuss any questions you have with your health care provider. Document Released: 04/15/2000 Document Revised: 09/16/2015 Document Reviewed: 12/31/2014 Elsevier Interactive Patient Education  2018 [employer]. Irritable Bowel Syndrome, Adult Irritable bowel syndrome (IBS) is not one specific disease. It is a group of symptoms that affects the organs responsible for digestion (gastrointestinal or GI tract). To regulate how your GI tract works, your body sends signals back and forth between your intestines and your brain. If you have IBS, there may be a problem with these signals. As a result, your GI tract does not function normally. Your intestines may become more sensitive and overreact to certain things. This is especially true when you eat certain foods or when you are under stress. There are four types of IBS. These may be determined based on the consistency of your stool:  IBS with diarrhea.  IBS with constipation.  Mixed IBS.  Unsubtyped IBS.  It is important to know which type of IBS you have. Some treatments are more likely to be helpful for certain types of IBS. What are the causes? The exact cause of IBS is not known. What increases the risk? You may have a higher risk of IBS if:  You are a woman.  You are younger than 33 years old.  You have a family history of IBS.  You have mental health problems.  You have had bacterial infection of your GI tract.  What are the signs or symptoms? Symptoms of IBS vary from person to person. The main symptom is abdominal pain or discomfort. Additional symptoms usually include one or more of the following:  Diarrhea, constipation, or both.  Abdominal swelling or  bloating.  Feeling full or sick after eating a small or regular-size meal.  Frequent gas.  Mucus in the stool.  A feeling of having more stool left after a bowel movement.  Symptoms tend to come and go. They may be associated with stress, psychiatric conditions, or nothing at all. How is this diagnosed? There is no specific test to diagnose IBS. Your health care provider will make a diagnosis based on a physical exam, medical history, and your symptoms. You may have other tests to rule out other conditions that may be causing your symptoms. These may include:  Blood tests.  X-rays.  CT scan.  Endoscopy and colonoscopy. This is a test in which your GI tract is viewed with a long, thin, flexible tube.  How is this treated? There is no cure for IBS, but treatment can help relieve symptoms. IBS treatment often includes:  Changes to your diet, such as: ? Eating more fiber. ? Avoiding foods that cause symptoms. ? Drinking more water. ? Eating regular, medium-sized portioned meals.  Medicines. These may include: ? Fiber supplements if you have constipation. ? Medicine to control diarrhea (antidiarrheal  medicines). ? Medicine to help control muscle spasms in your GI tract (antispasmodic medicines). ? Medicines to help with any mental health issues, such as antidepressants or tranquilizers.  Therapy. ? Talk therapy may help with anxiety, depression, or other mental health issues that can make IBS symptoms worse.  Stress reduction. ? Managing your stress can help keep symptoms under control.  Follow these instructions at home:  Take medicines only as directed by your health care provider.  Eat a healthy diet. ? Avoid foods and drinks with added sugar. ? Include more whole grains, fruits, and vegetables gradually into your diet. This may be especially helpful if you have IBS with constipation. ? Avoid any foods and drinks that make your symptoms worse. These may include dairy  products and caffeinated or carbonated drinks. ? Do not eat large meals. ? Drink enough fluid to keep your urine clear or pale yellow.  Exercise regularly. Ask your health care provider for recommendations of good activities for you.  Keep all follow-up visits as directed by your health care provider. This is important. Contact a health care provider if:  You have constant pain.  You have trouble or pain with swallowing.  You have worsening diarrhea. Get help right away if:  You have severe and worsening abdominal pain.  You have diarrhea and: ? You have a rash, stiff neck, or severe headache. ? You are irritable, sleepy, or difficult to awaken. ? You are weak, dizzy, or extremely thirsty.  You have bright red blood in your stool or you have black tarry stools.  You have unusual abdominal swelling that is painful.  You vomit continuously.  You vomit blood (hematemesis).  You have both abdominal pain and a fever. This information is not intended to replace advice given to you by your health care provider. Make sure you discuss any questions you have with your health care provider. Document Released: 04/18/2005 Document Revised: 09/18/2015 Document Reviewed: 01/03/2014 Elsevier Interactive Patient Education  2018 [employer]. Diet for Irritable Bowel Syndrome When you have irritable bowel syndrome (IBS), the foods you eat and your eating habits are very important. IBS may cause various symptoms, such as abdominal pain, constipation, or diarrhea. Choosing the right foods can help ease discomfort caused by these symptoms. Work with your health care provider and dietitian to find the best eating plan to help control your symptoms. What general guidelines do I need to follow?  Keep a food diary. This will help you identify foods that cause symptoms. Write down: ? What you eat and when. ? What symptoms you have. ? When symptoms occur in relation to your meals.  Avoid foods that  cause symptoms. Talk with your dietitian about other ways to get the same nutrients that are in these foods.  Eat more foods that contain fiber. Take a fiber supplement if directed by your dietitian.  Eat your meals slowly, in a relaxed setting.  Aim to eat 5-6 small meals per day. Do not skip meals.  Drink enough fluids to keep your urine clear or pale yellow.  Ask your health care provider if you should take an over-the-counter probiotic during flare-ups to help restore healthy gut bacteria.  If you have cramping or diarrhea, try making your meals low in fat and high in carbohydrates. Examples of carbohydrates are pasta, rice, whole grain breads and cereals, fruits, and vegetables.  If dairy products cause your symptoms to flare up, try eating less of them. You might be able to handle  yogurt better than other dairy products because it contains bacteria that help with digestion. What foods are not recommended? The following are some foods and drinks that may worsen your symptoms:  Fatty foods, such as [place] fries.  Milk products, such as cheese or ice cream.  Chocolate.  Alcohol.  Products with caffeine, such as coffee.  Carbonated drinks, such as soda.  The items listed above may not be a complete list of foods and beverages to avoid. Contact your dietitian for more information. What foods are good sources of fiber? Your health care provider or dietitian may recommend that you eat more foods that contain fiber. Fiber can help reduce constipation and other IBS symptoms. Add foods with fiber to your diet a little at a time so that your body can get used to them. Too much fiber at once might cause gas and swelling of your abdomen. The following are some foods that are good sources of fiber:  Apples.  Peaches.  Pears.  Berries.  Figs.  Broccoli (raw).  Cabbage.  Carrots.  Raw peas.  Kidney beans.  Lima beans.  Whole grain bread.  Whole grain cereal.  Where  to find more information: [employer] for Functional Gastrointestinal Disorders: www.iffgd.[employer] of Diabetes and Digestive and Kidney Diseases: NetworkAffair.co.za.aspx This information is not intended to replace advice given to you by your health care provider. Make sure you discuss any questions you have with your health care provider. Document Released: 07/09/2003 Document Revised: 09/24/2015 Document Reviewed: 07/19/2013 Elsevier Interactive Patient Education  2018 [employer].

## 2017-09-04 NOTE — Progress Notes (Signed)
Subjective:        Sherri Castaneda is a 32 y.o. female here for a routine exam.  Current complaints: Chronic constipation and irritation from hemorrhoids   Personal health questionnaire:  Is patient Ashkenazi Jewish, have a family history of breast and/or ovarian cancer: no Is there a family history of uterine cancer diagnosed at age < 2, gastrointestinal cancer, urinary tract cancer, family member who is a Field seismologist syndrome-associated carrier: no Is the patient overweight and hypertensive, family history of diabetes, personal history of gestational diabetes, preeclampsia or PCOS: no Is patient over 69, have PCOS,  family history of premature CHD under age 39, diabetes, smoke, have hypertension or peripheral artery disease:  no At any time, has a partner hit, kicked or otherwise hurt or frightened you?: no Over the past 2 weeks, have you felt down, depressed or hopeless?: no Over the past 2 weeks, have you felt little interest or pleasure in doing things?:no   Gynecologic History No LMP recorded. Patient has had an implant. Contraception: Nexplanon Last Pap: 2017. Results were: normal Last mammogram: n/a. Results were: n/a  Obstetric History OB History  Gravida Para Term Preterm AB Living  4 4 2 2   4   SAB TAB Ectopic Multiple Live Births          4    # Outcome Date GA Lbr Len/2nd Weight Sex Delivery Anes PTL Lv  4 Term 10/09/13 [redacted]w[redacted]d 01:04 / 00:04 7 lb 7.9 oz (3.4 kg) F Vag-Spont EPI  LIV  3 Term 03/28/12 [redacted]w[redacted]d 06:45 / 00:52 8 lb 0.2 oz (3.635 kg) F Vag-Spont EPI  LIV  2 Preterm 12/02/07 [redacted]w[redacted]d  6 lb 2 oz (2.778 kg) F Vag-Spont EPI  LIV  1 Preterm 09/18/05 [redacted]w[redacted]d  5 lb 12 oz (2.608 kg) F Vag-Spont EPI  LIV    Past Medical History:  Diagnosis Date  . Preterm labor    Delivery at 32 weeks and 34 weeks.  . Seasonal allergies     Past Surgical History:  Procedure Laterality Date  . GYNECOLOGIC CRYOSURGERY  2006     Current Outpatient Medications:  .  etonogestrel  (NEXPLANON) 68 MG IMPL implant, 1 each by Subdermal route once., Disp: , Rfl:  .  dicyclomine (BENTYL) 20 MG tablet, Take 1 tablet (20 mg total) by mouth 3 (three) times daily before meals., Disp: 30 tablet, Rfl: 2 .  docusate sodium (COLACE) 100 MG capsule, Take 1 capsule (100 mg total) by mouth 2 (two) times daily., Disp: 60 capsule, Rfl: 11 Allergies  Allergen Reactions  . Flagyl [Metronidazole] Swelling  . Pineapple Itching and Swelling  . Latex Itching and Rash    Social History   Tobacco Use  . Smoking status: Never Smoker  . Smokeless tobacco: Never Used  Substance Use Topics  . Alcohol use: No    Family History  Problem Relation Age of Onset  . Cancer Mother   . Cancer Maternal Grandmother   . Cancer Father       Review of Systems  Constitutional: negative for fatigue and weight loss Respiratory: negative for cough and wheezing Cardiovascular: negative for chest pain, fatigue and palpitations Gastrointestinal: positive for chronic constipation Musculoskeletal:negative for myalgias Neurological: negative for gait problems and tremors Behavioral/Psych: negative for abusive relationship, depression Endocrine: negative for temperature intolerance    Genitourinary:negative for abnormal menstrual periods, genital lesions, hot flashes, sexual problems and vaginal discharge Integument/breast: negative for breast lump, breast tenderness, nipple discharge and skin lesion(s)  Objective:       BP 127/89   Pulse 88   Ht 5' (1.524 m)   Wt 150 lb (68 kg)   BMI 29.29 kg/m  General:   alert  Skin:   no rash or abnormalities  Lungs:   clear to auscultation bilaterally  Heart:   regular rate and rhythm, S1, S2 normal, no murmur, click, rub or gallop  Breasts:   normal without suspicious masses, skin or nipple changes or axillary nodes  Abdomen:  normal findings: no organomegaly, soft, non-tender and no hernia  Pelvis:  External genitalia: normal general  appearance Urinary system: urethral meatus normal and bladder without fullness, nontender Vaginal: normal without tenderness, induration or masses Cervix: normal appearance Adnexa: normal bimanual exam Uterus: anteverted and non-tender, normal size   Lab Review Urine pregnancy test Labs reviewed yes Radiologic studies reviewed no  50% of 20 min visit spent on counseling and coordination of care.   Assessment:     1. Encounter for annual routine gynecological examination Rx: - Cervicovaginal ancillary only  2. Screening for cervical cancer Rx: - Cytology - PAP  3. Constipation by delayed colonic transit Rx: - dicyclomine (BENTYL) 20 MG tablet; Take 1 tablet (20 mg total) by mouth 3 (three) times daily before meals.  Dispense: 30 tablet; Refill: 2 - docusate sodium (COLACE) 100 MG capsule; Take 1 capsule (100 mg total) by mouth 2 (two) times daily.  Dispense: 60 capsule; Refill: 11 - Ambulatory referral to Gastroenterology  4. Grade I hemorrhoids    Plan:    Education reviewed: calcium supplements, depression evaluation, low fat, low cholesterol diet, safe sex/STD prevention, self breast exams and weight bearing exercise. Contraception: Nexplanon. Follow up in: 1 year.   Meds ordered this encounter  Medications  . dicyclomine (BENTYL) 20 MG tablet    Sig: Take 1 tablet (20 mg total) by mouth 3 (three) times daily before meals.    Dispense:  30 tablet    Refill:  2  . docusate sodium (COLACE) 100 MG capsule    Sig: Take 1 capsule (100 mg total) by mouth 2 (two) times daily.    Dispense:  60 capsule    Refill:  11   Orders Placed This Encounter  Procedures  . Ambulatory referral to Gastroenterology    Referral Priority:   Routine    Referral Type:   Consultation    Referral Reason:   Specialty Services Required    Number of Visits Requested:   1    Shelly Bombard MD 09-04-2017

## 2017-09-04 NOTE — Progress Notes (Signed)
Patient presents for her Annual Exam today.  CC: pt notes Hx of hemorrhoids and rectal pain and noticed blood  Pt states she has IBS.and constipation.   LMP: varies w/ nexplanon has spotting.  Last Pap: 12/31/2015 WNL   Contraception: Nexplanon Inserted 11/24/2016  STD Testing: Declines

## 2017-09-05 LAB — CERVICOVAGINAL ANCILLARY ONLY
Bacterial vaginitis: POSITIVE — AB
Candida vaginitis: NEGATIVE
Chlamydia: NEGATIVE
Neisseria Gonorrhea: NEGATIVE
Trichomonas: NEGATIVE

## 2017-09-06 ENCOUNTER — Telehealth: Payer: Self-pay

## 2017-09-06 ENCOUNTER — Other Ambulatory Visit: Payer: Self-pay | Admitting: Obstetrics

## 2017-09-06 DIAGNOSIS — B9689 Other specified bacterial agents as the cause of diseases classified elsewhere: Secondary | ICD-10-CM

## 2017-09-06 DIAGNOSIS — N760 Acute vaginitis: Principal | ICD-10-CM

## 2017-09-06 LAB — CYTOLOGY - PAP
DIAGNOSIS: NEGATIVE
HPV: NOT DETECTED

## 2017-09-06 MED ORDER — CLINDAMYCIN HCL 300 MG PO CAPS: 300.0000 mg | ORAL_CAPSULE | Freq: Three times a day (TID) | ORAL | 0 refills | Status: DC

## 2017-09-06 NOTE — Telephone Encounter (Signed)
Advised of results and rx sent by provider.

## 2018-02-18 ENCOUNTER — Ambulatory Visit (HOSPITAL_COMMUNITY)
Admission: EM | Admit: 2018-02-18 | Discharge: 2018-02-18 | Disposition: A | Discharge status: Home / Self Care | Payer: Worker's Compensation | Attending: Family Medicine | Admitting: Family Medicine

## 2018-02-18 ENCOUNTER — Other Ambulatory Visit: Payer: Self-pay

## 2018-02-18 ENCOUNTER — Ambulatory Visit (INDEPENDENT_AMBULATORY_CARE_PROVIDER_SITE_OTHER): Payer: Worker's Compensation

## 2018-02-18 ENCOUNTER — Encounter (HOSPITAL_COMMUNITY): Payer: Self-pay | Admitting: Emergency Medicine

## 2018-02-18 DIAGNOSIS — S8391XA Sprain of unspecified site of right knee, initial encounter: Secondary | ICD-10-CM

## 2018-02-18 DIAGNOSIS — Z026 Encounter for examination for insurance purposes: Secondary | ICD-10-CM

## 2018-02-18 DIAGNOSIS — W010XXA Fall on same level from slipping, tripping and stumbling without subsequent striking against object, initial encounter: Secondary | ICD-10-CM

## 2018-02-18 DIAGNOSIS — R2241 Localized swelling, mass and lump, right lower limb: Secondary | ICD-10-CM

## 2018-02-18 DIAGNOSIS — R1030 Lower abdominal pain, unspecified: Secondary | ICD-10-CM

## 2018-02-18 DIAGNOSIS — M25561 Pain in right knee: Secondary | ICD-10-CM

## 2018-02-18 DIAGNOSIS — R102 Pelvic and perineal pain: Secondary | ICD-10-CM

## 2018-02-18 DIAGNOSIS — W19XXXA Unspecified fall, initial encounter: Secondary | ICD-10-CM | POA: Diagnosis not present

## 2018-02-18 MED ORDER — CYCLOBENZAPRINE HCL 10 MG PO TABS: 10.0000 mg | ORAL_TABLET | Freq: Two times a day (BID) | ORAL | 0 refills | Status: DC | PRN

## 2018-02-18 MED ORDER — TRAMADOL HCL 50 MG PO TABS: 50.0000 mg | ORAL_TABLET | Freq: Four times a day (QID) | ORAL | 0 refills | Status: DC | PRN

## 2018-02-18 MED ORDER — MELOXICAM 15 MG PO TABS: 15.0000 mg | ORAL_TABLET | Freq: Every day | ORAL | 1 refills | Status: DC

## 2018-02-18 NOTE — Discharge Instructions (Addendum)
Wear knee brace during day while ambulating. Remove during hours of rest and sleep.  You will follow-up with occupational health 02/26/2018.

## 2018-02-18 NOTE — ED Triage Notes (Signed)
The patient presented to the [facility] with a complaint of upper and lower right leg pain and knee pain secondary to a slip and fall at work that occurred 4 days ago.

## 2018-02-18 NOTE — ED Provider Notes (Signed)
[employer]    CSN: [id number] Arrival date & time: 02/18/18  1251     History   Chief Complaint Chief Complaint  Patient presents with  . Leg Pain    HPI Sherri Castaneda is a 32 y.o. female.   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 She tripped on a wet mat landing on her back and upper right side. She initially did not experience pain. However, over the course of the last 3 days, she has experienced worsening symptoms of right knee pain and leg swelling and she has a "pullin sensation" along the right thigh joint. Pain is exacerbated with driving as her knee pain worsens with pressing gas pedals. She describes difficulty with weight bearing as walking and standing worsens pain. She has not been evaluated for injury related to fall until today.  Past Medical History:  Diagnosis Date  . Preterm labor    Delivery at 32 weeks and 34 weeks.  . Seasonal allergies     Patient Active Problem List   Diagnosis Date Noted  . Active labor 10/09/2013  . Normal delivery 10/09/2013  . Pregnancy related symphysis pain in third trimester, antepartum 09/25/2013  . GBS (group B Streptococcus carrier), +RV culture, currently pregnant 09/21/2013  . Supervision of other normal pregnancy 03/18/2013  . Pregnancy related nausea and vomiting, antepartum 03/18/2013  . Previous preterm delivery, antepartum 03/18/2013    Past Surgical History:  Procedure Laterality Date  . GYNECOLOGIC CRYOSURGERY  2006    OB History    Gravida  4   Para  4   Term  2   Preterm  2   AB      Living  4     SAB      TAB      Ectopic      Multiple      Live Births  4            Home Medications    Prior to Admission medications   Medication Sig Start Date End Date Taking? Authorizing Provider  etonogestrel (NEXPLANON) 68 MG IMPL implant 1 each by Subdermal route once.   Yes  [provider]  cyclobenzaprine (FLEXERIL) 10 MG tablet Take 1 tablet (10 mg total) by mouth 2 (two) times daily as needed for muscle spasms. 02/18/18   Scot Jun, FNP  meloxicam (MOBIC) 15 MG tablet Take 1 tablet (15 mg total) by mouth daily. 02/18/18   Scot Jun, FNP  traMADol (ULTRAM) 50 MG tablet Take 1 tablet (50 mg total) by mouth every 6 (six) hours as needed. 02/18/18   Scot Jun, FNP    Family History Family History  Problem Relation Age of Onset  . Cancer Mother   . Cancer Maternal Grandmother   . Cancer Father     Social History Social History   Tobacco Use  . Smoking status: Never Smoker  . Smokeless tobacco: Never Used  Substance Use Topics  . Alcohol use: No  . Drug use: No     Allergies   Flagyl [metronidazole]; Pineapple; and Latex   Review of Systems Review of Systems Pertinent negatives listed in HPIPhysical Exam Triage Vital Signs ED Triage Vitals  Enc Vitals Group     BP 02/18/18 1319 126/81     Pulse Rate 02/18/18 1319 85     Resp 02/18/18 1319 16  Temp 02/18/18 1319 98 F (36.7 C)     Temp Source 02/18/18 1319 Oral     SpO2 02/18/18 1319 100 %     Weight --      Height --      Head Circumference --      Peak Flow --      Pain Score 02/18/18 1318 7     Pain Loc --      Pain Edu? --      Excl. in [place]? --    No data found.  Updated Vital Signs BP 126/81 (BP Location: Right Arm)   Pulse 85   Temp 98 F (36.7 C) (Oral)   Resp 16   SpO2 100%   Visual Acuity Right Eye Distance:   Left Eye Distance:   Bilateral Distance:    Right Eye Near:   Left Eye Near:    Bilateral Near:     Physical Exam General appearance: alert, well developed, well nourished, cooperative and in no distress Head: Normocephalic, without obvious abnormality, atraumatic Respiratory: Respirations even and unlabored, normal respiratory rate Extremities: right tibiofibula edema noted.  Tenderness with palpation along MCL.  Negative for effusion Skin: Skin color, texture, turgor normal. No rashes seen  Psych: Appropriate mood and affect. Neurologic: Mental status: Alert, oriented to person, place, and time, thought content appropriate.  UC Treatments / Results  Labs (all labs ordered are listed, but only abnormal results are displayed) Labs Reviewed - No data to display  EKG None  Radiology Dg Knee Complete 4 Views Right  Result Date: 02/18/2018 CLINICAL DATA:  Right knee pain since a fall 4 days ago. Initial encounter. EXAM: RIGHT KNEE - COMPLETE 4+ VIEW COMPARISON:  None. FINDINGS: No evidence of fracture, dislocation, or joint effusion. No evidence of arthropathy or other focal bone abnormality. Soft tissues are unremarkable. IMPRESSION: Normal exam. Electronically Signed   By: Inge Rise M.D.   On: 02/18/2018 14:48   Dg Hip Unilat With Pelvis 2-3 Views Right  Result Date: 02/18/2018 CLINICAL DATA:  Right hip pain since a fall four days ago. Initial encounter. EXAM: DG HIP (WITH OR WITHOUT PELVIS) 2-3V RIGHT COMPARISON:  None. FINDINGS: There is no evidence of hip fracture or dislocation. There is no evidence of arthropathy or other focal bone abnormality. IMPRESSION: Normal exam. Electronically Signed   By: Inge Rise M.D.   On: 02/18/2018 14:48    Procedures Procedures (including critical care time)  Medications Ordered in UC Medications - No data to display  Initial Impression / Assessment and Plan / UC Course  I have reviewed the triage vital signs and the nursing notes.  Pertinent labs & imaging results that were available during my care of the patient were reviewed by me and considered in my medical decision making (see chart for details).   Imaging negative of any acute or chronic findings. Patient pain is likely related to inflammation s/p fall. Will treat with antiinflammatory therapy- Meloxicam, manage pain with a short course of tramadol and cyclobenzaprine. Patient excused  from work for 1 day and advised to return to work with light duty restrictions. Patient is to follow-up in one week with [facility].Patient placed in a knee brace immobilizer. Patient agreed with plan and verbalized understanding.    Final Clinical Impressions(s) / UC Diagnoses   Final diagnoses:  Fall  Groin pain, Right  Encounter related to worker's compensation claim  Sprain of right knee, unspecified ligament, initial encounter  Discharge Instructions     Wear knee brace during day while ambulating. Remove during hours of rest and sleep.  You will follow-up with occupational health 02/26/2018.    ED Prescriptions    Medication Sig Dispense Auth. Provider   meloxicam (MOBIC) 15 MG tablet Take 1 tablet (15 mg total) by mouth daily. 30 tablet Scot Jun, FNP   cyclobenzaprine (FLEXERIL) 10 MG tablet Take 1 tablet (10 mg total) by mouth 2 (two) times daily as needed for muscle spasms. 20 tablet Scot Jun, FNP   traMADol (ULTRAM) 50 MG tablet Take 1 tablet (50 mg total) by mouth every 6 (six) hours as needed. 15 tablet Scot Jun, FNP     Controlled Substance Prescriptions [place] Controlled Substance Registry consulted? Yes, I have consulted the  Controlled Substances Registry for this patient, and feel the risk/benefit ratio today is favorable for proceeding with this prescription for a controlled substance.   Scot Jun, FNP 02/19/18 2101

## 2018-07-11 ENCOUNTER — Other Ambulatory Visit: Payer: Self-pay

## 2018-07-11 ENCOUNTER — Telehealth: Payer: Self-pay | Admitting: *Deleted

## 2018-07-11 ENCOUNTER — Encounter: Payer: Self-pay | Admitting: Obstetrics

## 2018-07-11 ENCOUNTER — Ambulatory Visit: Payer: BC Managed Care – PPO | Admitting: Obstetrics

## 2018-07-11 VITALS — BP 121/79 | HR 82 | Ht 60.0 in | Wt 167.8 lb

## 2018-07-11 DIAGNOSIS — R300 Dysuria: Secondary | ICD-10-CM | POA: Diagnosis not present

## 2018-07-11 DIAGNOSIS — Z113 Encounter for screening for infections with a predominantly sexual mode of transmission: Secondary | ICD-10-CM

## 2018-07-11 DIAGNOSIS — R102 Pelvic and perineal pain: Secondary | ICD-10-CM

## 2018-07-11 DIAGNOSIS — N898 Other specified noninflammatory disorders of vagina: Secondary | ICD-10-CM | POA: Diagnosis not present

## 2018-07-11 LAB — POCT URINALYSIS DIPSTICK
BILIRUBIN UA: NEGATIVE
Blood, UA: NEGATIVE
Glucose, UA: NEGATIVE
Leukocytes, UA: NEGATIVE
NITRITE UA: NEGATIVE
PH UA: 6 (ref 5.0–8.0)
PROTEIN UA: POSITIVE — AB
SPEC GRAV UA: 1.01 (ref 1.010–1.025)
UROBILINOGEN UA: 0.2 U/dL

## 2018-07-11 MED ORDER — OXYCODONE HCL 10 MG PO TABS: 10.0000 mg | ORAL_TABLET | Freq: Four times a day (QID) | ORAL | 0 refills | Status: DC | PRN

## 2018-07-11 NOTE — Progress Notes (Signed)
Presents for Abdominal Pain in LLQ 8/10 x 3 days. Painful to urinate. Denies fever, chills, NV or burning.  Last PAP 09/04/2017

## 2018-07-11 NOTE — Telephone Encounter (Signed)
Open in error

## 2018-07-11 NOTE — Progress Notes (Signed)
Patient ID: Sherri Castaneda, female   DOB: 1986/01/18, 33 y.o.   MRN: [id number]  Chief Complaint  Patient presents with  . Abdominal Pain    HPI Sherri Castaneda is a 33 y.o. female.  Pelvic pain since this past Monday, and getting worse.  Pain is in LLQ and is constant and sharp.  Painful to urinate. HPI  Past Medical History:  Diagnosis Date  . Preterm labor    Delivery at 32 weeks and 34 weeks.  . Seasonal allergies     Past Surgical History:  Procedure Laterality Date  . GYNECOLOGIC CRYOSURGERY  2006    Family History  Problem Relation Age of Onset  . Cancer Mother   . Cancer Maternal Grandmother   . Cancer Father     Social History Social History   Tobacco Use  . Smoking status: Never Smoker  . Smokeless tobacco: Never Used  Substance Use Topics  . Alcohol use: No  . Drug use: No    Allergies  Allergen Reactions  . Flagyl [Metronidazole] Swelling  . Pineapple Itching and Swelling  . Latex Itching and Rash    Current Outpatient Medications  Medication Sig Dispense Refill  . cyclobenzaprine (FLEXERIL) 10 MG tablet Take 1 tablet (10 mg total) by mouth 2 (two) times daily as needed for muscle spasms. (Patient not taking: Reported on 07/11/2018) 20 tablet 0  . etonogestrel (NEXPLANON) 68 MG IMPL implant 1 each by Subdermal route once.    . meloxicam (MOBIC) 15 MG tablet Take 1 tablet (15 mg total) by mouth daily. (Patient not taking: Reported on 07/11/2018) 30 tablet 1  . Oxycodone HCl 10 MG TABS Take 1 tablet (10 mg total) by mouth every 6 (six) hours as needed. 30 tablet 0  . traMADol (ULTRAM) 50 MG tablet Take 1 tablet (50 mg total) by mouth every 6 (six) hours as needed. (Patient not taking: Reported on 07/11/2018) 15 tablet 0   No current facility-administered medications for this visit.     Review of Systems Review of Systems Constitutional: negative for fatigue and weight loss Respiratory: negative for cough and wheezing Cardiovascular: negative for chest  pain, fatigue and palpitations Gastrointestinal: negative for abdominal pain and change in bowel habits Genitourinary:positive for pelvic pain and pain with urination in the same area - LLQ Integument/breast: negative for nipple discharge Musculoskeletal:negative for myalgias Neurological: negative for gait problems and tremors Behavioral/Psych: negative for abusive relationship, depression Endocrine: negative for temperature intolerance      Blood pressure 121/79, pulse 82, height 5' (1.524 m), weight 167 lb 12.8 oz (76.1 kg).  Physical Exam Physical Exam General:   alert  Skin:   no rash or abnormalities  Lungs:   clear to auscultation bilaterally  Heart:   regular rate and rhythm, S1, S2 normal, no murmur, click, rub or gallop  Breasts:   normal without suspicious masses, skin or nipple changes or axillary nodes  Abdomen:  normal findings: no organomegaly, soft, non-tender and no hernia  Pelvis:  External genitalia: normal general appearance Urinary system: urethral meatus normal and bladder without fullness, nontender Vaginal: normal without tenderness, induration or masses Cervix: normal appearance Adnexa: tender LLQ, no masses Uterus: anteverted and non-tender, normal size    50% of 15 min visit spent on counseling and coordination of care.   Data Reviewed Wet Prep and Cultures  Assessment     1. Pelvic pain Rx: - POCT Urinalysis Dipstick - Cervicovaginal ancillary only( [place]) - US PELVIC COMPLETE  WITH TRANSVAGINAL; Future - Oxycodone HCl 10 MG TABS; Take 1 tablet (10 mg total) by mouth every 6 (six) hours as needed.  Dispense: 30 tablet; Refill: 0  2. Dysuria Rx: - Urine Culture    Plan    Follow up in 2 weeks   Orders Placed This Encounter  Procedures  . Urine Culture  . US PELVIC COMPLETE WITH TRANSVAGINAL    Epic ORDER WT 167 / NO NEEDS / NO COUGH, FEVER, SOB / NO OUTSIDE THE [place] TRAVELS IN THE LAST 14 DAYS INS - BCBS BL/ANEETRA @ [facility]     Standing Status:   Future    Standing Expiration Date:   09/10/2019    Order Specific Question:   Reason for Exam (SYMPTOM  OR DIAGNOSIS REQUIRED)    Answer:   Pelvic pain - Left lower quadrant    Order Specific Question:   Preferred imaging location?    Answer:   [facility]  . POCT Urinalysis Dipstick   Meds ordered this encounter  Medications  . Oxycodone HCl 10 MG TABS    Sig: Take 1 tablet (10 mg total) by mouth every 6 (six) hours as needed.    Dispense:  30 tablet    Refill:  0    Shelly Bombard MD 07-11-2018

## 2018-07-12 LAB — CERVICOVAGINAL ANCILLARY ONLY
Bacterial vaginitis: NEGATIVE
CHLAMYDIA, DNA PROBE: NEGATIVE
Candida vaginitis: NEGATIVE
NEISSERIA GONORRHEA: NEGATIVE
TRICH (WINDOWPATH): NEGATIVE

## 2018-07-13 LAB — URINE CULTURE

## 2018-07-16 ENCOUNTER — Other Ambulatory Visit: Payer: Self-pay

## 2018-12-05 ENCOUNTER — Other Ambulatory Visit: Payer: Self-pay

## 2018-12-05 DIAGNOSIS — Z20822 Contact with and (suspected) exposure to covid-19: Secondary | ICD-10-CM

## 2018-12-06 LAB — NOVEL CORONAVIRUS, NAA: SARS-CoV-2, NAA: NOT DETECTED

## 2018-12-07 ENCOUNTER — Telehealth: Payer: Self-pay

## 2018-12-07 NOTE — Telephone Encounter (Signed)
Patient informed of negative covid result. Patient verbalized understanding.

## 2018-12-07 NOTE — Telephone Encounter (Signed)
Provided Covid-19 lab results to Patient.  Mailed result to Patient.

## 2019-01-21 ENCOUNTER — Other Ambulatory Visit: Payer: Self-pay

## 2019-01-21 DIAGNOSIS — Z20822 Contact with and (suspected) exposure to covid-19: Secondary | ICD-10-CM

## 2019-01-23 ENCOUNTER — Telehealth: Payer: Self-pay | Admitting: General Practice

## 2019-01-23 LAB — NOVEL CORONAVIRUS, NAA: SARS-CoV-2, NAA: NOT DETECTED

## 2019-01-23 NOTE — Telephone Encounter (Signed)
Negative COVID results given. Patient results "NOT Detected." Caller expressed understanding. ° °

## 2019-11-17 ENCOUNTER — Other Ambulatory Visit: Payer: Self-pay

## 2019-11-17 ENCOUNTER — Ambulatory Visit
Admission: EM | Admit: 2019-11-17 | Discharge: 2019-11-17 | Disposition: A | Discharge status: Home / Self Care | Payer: BC Managed Care – PPO | Attending: Emergency Medicine | Admitting: Emergency Medicine

## 2019-11-17 ENCOUNTER — Ambulatory Visit (INDEPENDENT_AMBULATORY_CARE_PROVIDER_SITE_OTHER): Payer: BC Managed Care – PPO

## 2019-11-17 ENCOUNTER — Encounter: Payer: Self-pay | Admitting: Emergency Medicine

## 2019-11-17 DIAGNOSIS — M79671 Pain in right foot: Secondary | ICD-10-CM

## 2019-11-17 DIAGNOSIS — W109XXA Fall (on) (from) unspecified stairs and steps, initial encounter: Secondary | ICD-10-CM

## 2019-11-17 DIAGNOSIS — S99921A Unspecified injury of right foot, initial encounter: Secondary | ICD-10-CM | POA: Diagnosis not present

## 2019-11-17 MED ORDER — MELOXICAM 15 MG PO TABS: 15.0000 mg | ORAL_TABLET | Freq: Every day | ORAL | 0 refills | Status: DC

## 2019-11-17 NOTE — ED Triage Notes (Addendum)
Pt was walking down stairs at approx 10am. Pt sts " I missed a few steps and rolled both left and right ankles. I heard a loud pop and my right foot hurts really bad." Pt ambulatory on crutches on arrival. Denies dizziness or lightness.   Pt states she took 1000 mg tylenol arthritis approx 10:45 and applied ankle brace to left ankle.

## 2019-11-17 NOTE — ED Notes (Signed)
Ice applied

## 2019-11-17 NOTE — ED Provider Notes (Signed)
[name]    CSN: [id number] Arrival date & time: 11/17/19  1044      History   Chief Complaint Chief Complaint  Patient presents with  . Foot Pain  . Ankle Pain    HPI Sherri Castaneda is a 34 y.o. female presenting for evaluation of bilateral foot and ankle pain status post fall.  Patient states that she was walking on the stairs around 10 AM this morning.  Missed a few steps and rolled both ankles.  Endorsing inversion injuries.  Felt a loud pop in her right foot.  Applied ASO brace to left foot, has crutches which she has been using to ambulate.  Denies deformity, numbness.  Took 1000 mg of Tylenol 15 minutes PTA.   Past Medical History:  Diagnosis Date  . Preterm labor    Delivery at 32 weeks and 34 weeks.  . Seasonal allergies     Patient Active Problem List   Diagnosis Date Noted  . Active labor 10/09/2013  . Normal delivery 10/09/2013  . Pregnancy related symphysis pain in third trimester, antepartum 09/25/2013  . GBS (group B Streptococcus carrier), +RV culture, currently pregnant 09/21/2013  . Supervision of other normal pregnancy 03/18/2013  . Pregnancy related nausea and vomiting, antepartum 03/18/2013  . Previous preterm delivery, antepartum 03/18/2013    Past Surgical History:  Procedure Laterality Date  . BILATERAL KNEE ARTHROSCOPY Right   . GYNECOLOGIC CRYOSURGERY  2006    OB History    Gravida  4   Para  4   Term  2   Preterm  2   AB      Living  4     SAB      TAB      Ectopic      Multiple      Live Births  4            Home Medications    Prior to Admission medications   Medication Sig Start Date End Date Taking? Authorizing Provider  etonogestrel (NEXPLANON) 68 MG IMPL implant 1 each by Subdermal route once.    [provider]  meloxicam (MOBIC) 15 MG tablet Take 1 tablet (15 mg total) by mouth daily. 11/17/19   Hall-Potvin, [place], PA-C    Family History Family History  Problem Relation  Age of Onset  . Cancer Mother   . Cancer Maternal Grandmother   . Cancer Father     Social History Social History   Tobacco Use  . Smoking status: Never Smoker  . Smokeless tobacco: Never Used  Vaping Use  . Vaping Use: Never assessed  Substance Use Topics  . Alcohol use: No  . Drug use: No     Allergies   Flagyl [metronidazole], Pineapple, and Latex   Review of Systems As per HPI   Physical Exam Triage Vital Signs ED Triage Vitals [11/17/19 1103]  Enc Vitals Group     BP      Pulse      Resp      Temp      Temp src      SpO2      Weight      Height      Head Circumference      Peak Flow      Pain Score 8     Pain Loc      Pain Edu?      Excl. in [place]?    No data found.  Updated Vital  Signs BP 125/86 (BP Location: Right Arm)   Pulse 80   Temp 98.2 F (36.8 C) (Oral)   Resp 16   LMP  (LMP Unknown)   SpO2 100%   Visual Acuity Right Eye Distance:   Left Eye Distance:   Bilateral Distance:    Right Eye Near:   Left Eye Near:    Bilateral Near:     Physical Exam Constitutional:      General: She is not in acute distress. HENT:     Head: Normocephalic and atraumatic.  Eyes:     General: No scleral icterus.    Pupils: Pupils are equal, round, and reactive to light.  Cardiovascular:     Rate and Rhythm: Normal rate.  Pulmonary:     Effort: Pulmonary effort is normal.  Musculoskeletal:     Comments: Left foot/ankle: No medial or lateral malleoli tenderness.  Patient does have anterior ankle tenderness without crepitus.  Neurovascularly intact.  Full active ROM with intact strength. Right foot/ankle: No medial or lateral malleoli tenderness.  Patient does have significant tenderness throughout her foot without deformity.  Neurovascularly intact.  Decreased ROM secondary pain.  Skin:    Coloration: Skin is not jaundiced or pale.  Neurological:     Mental Status: She is alert and oriented to person, place, and time.      UC Treatments /  Results  Labs (all labs ordered are listed, but only abnormal results are displayed) Labs Reviewed - No data to display  EKG   Radiology DG Foot Complete Right  Addendum Date: 11/17/2019   ADDENDUM REPORT: 11/17/2019 12:26 ADDENDUM: Upon further review, curvilinear lucency is seen involving the lateral aspect of the medial cuneiform bone, although definite cortical disruption is not noted. Possibility of fracture cannot be excluded, and CT scan may be performed for further evaluation. Electronically Signed   By: Marijo Conception M.D.   On: 11/17/2019 12:26   Result Date: 11/17/2019 CLINICAL DATA:  Right foot pain after fall today. EXAM: RIGHT FOOT COMPLETE - 3+ VIEW COMPARISON:  None. FINDINGS: There is no evidence of fracture or dislocation. There is no evidence of arthropathy or other focal bone abnormality. Soft tissues are unremarkable. IMPRESSION: Negative. Electronically Signed: By: Marijo Conception M.D. On: 11/17/2019 12:07    Procedures Procedures (including critical care time)  Medications Ordered in UC Medications - No data to display  Initial Impression / Assessment and Plan / UC Course  I have reviewed the triage vital signs and the nursing notes.  Pertinent labs & imaging results that were available during my care of the patient were reviewed by me and considered in my medical decision making (see chart for details).     Patient has more pain and swelling in right foot as compared to left.  Left foot likely with ankle sprain.  Obtain x-ray of right foot.  This is reviewed by me and radiology, consulted with Dr. Nyoka Cowden which prompted addendum as outlined above.  Would better be able to evaluate via CT: We will defer to orthopedic follow-up for that.  Patient placed in ASO brace, will use her crutches from home, and remain nonweightbearing until further evaluation.  Pt verbalized understanding and is agreeable to plan. Final Clinical Impressions(s) / UC Diagnoses   Final  diagnoses:  Injury of right foot, initial encounter     Discharge Instructions     May use crutches, wear ASO brace, and follow-up with orthopedics within the next week. Take meloxicam  for pain. Important to ice, elevate.    ED Prescriptions    Medication Sig Dispense Auth. Provider   meloxicam (MOBIC) 15 MG tablet Take 1 tablet (15 mg total) by mouth daily. 30 tablet Hall-Potvin, [place], PA-C     PDMP not reviewed this encounter.   Hall-Potvin, [place], [place] 11/17/19 1444

## 2019-11-17 NOTE — Discharge Instructions (Addendum)
May use crutches, wear ASO brace, and follow-up with orthopedics within the next week. Take meloxicam for pain. Important to ice, elevate.

## 2019-12-09 IMAGING — DX DG HIP (WITH OR WITHOUT PELVIS) 2-3V*R*
3 series · 3 of 3 positions shown · non-contrast
Comparison: None.

CLINICAL DATA: Right hip pain since a fall four days ago. Initial
encounter.

EXAM:
DG HIP (WITH OR WITHOUT PELVIS) 2-3V RIGHT

[pelvis ap]
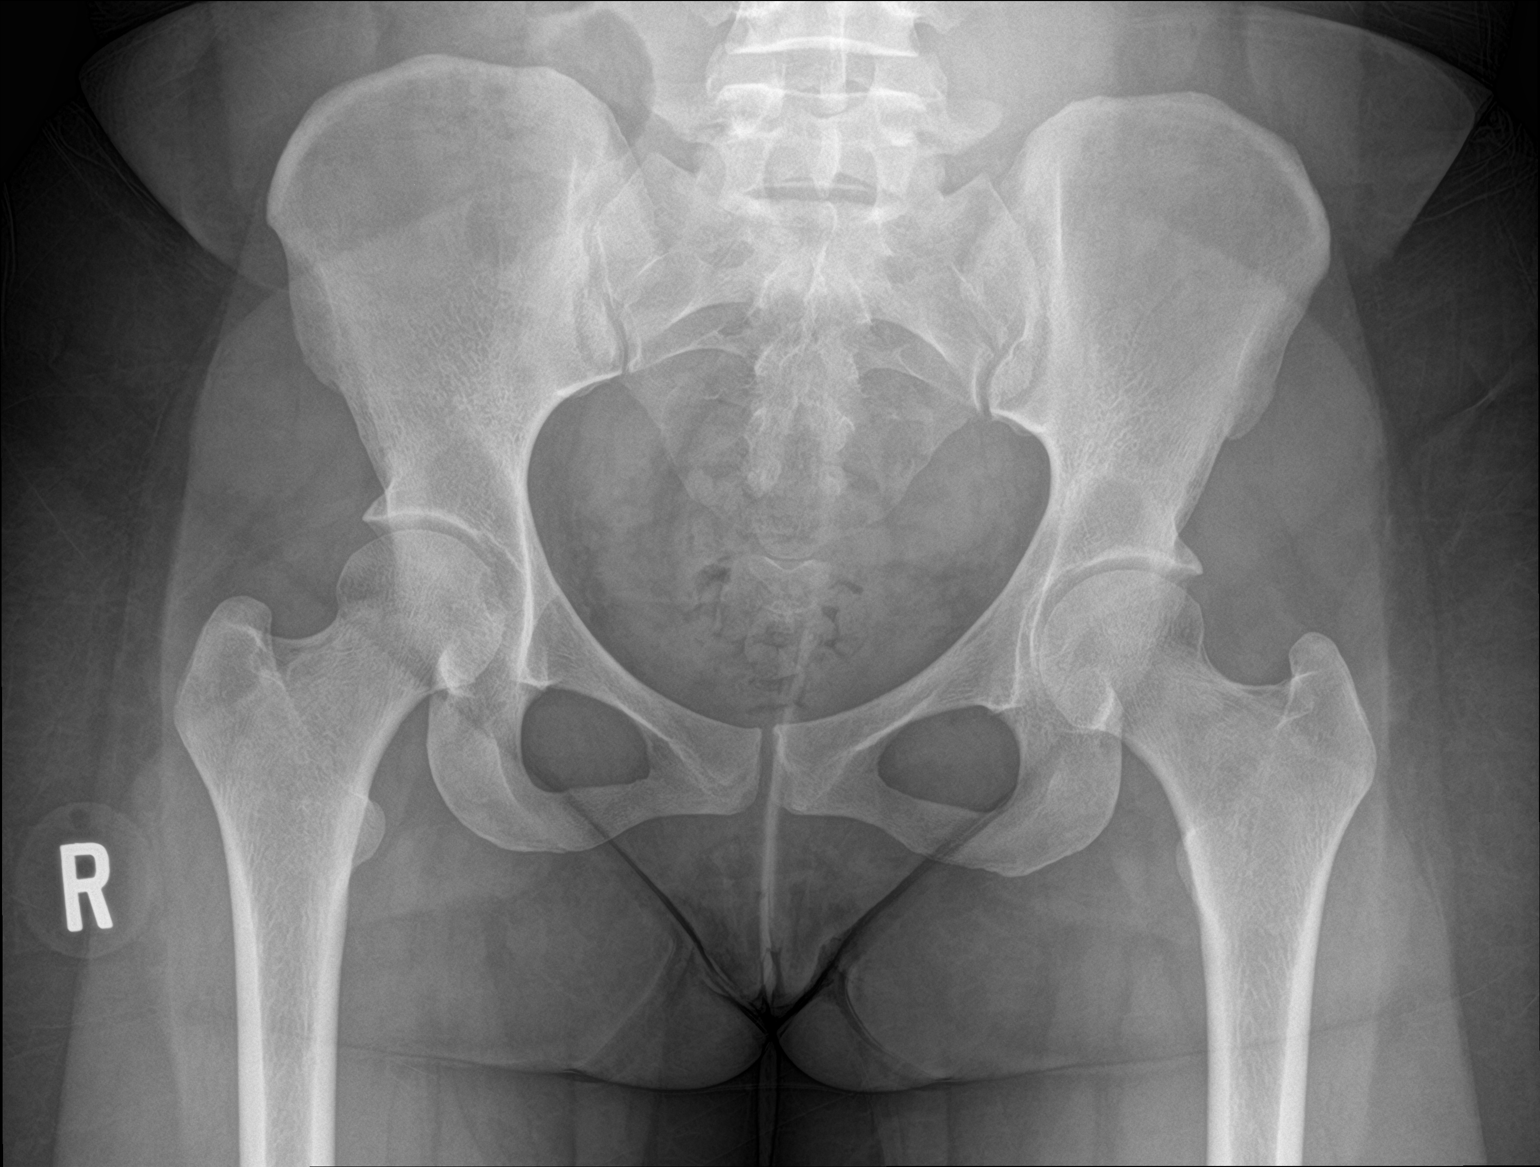

[hip ap]
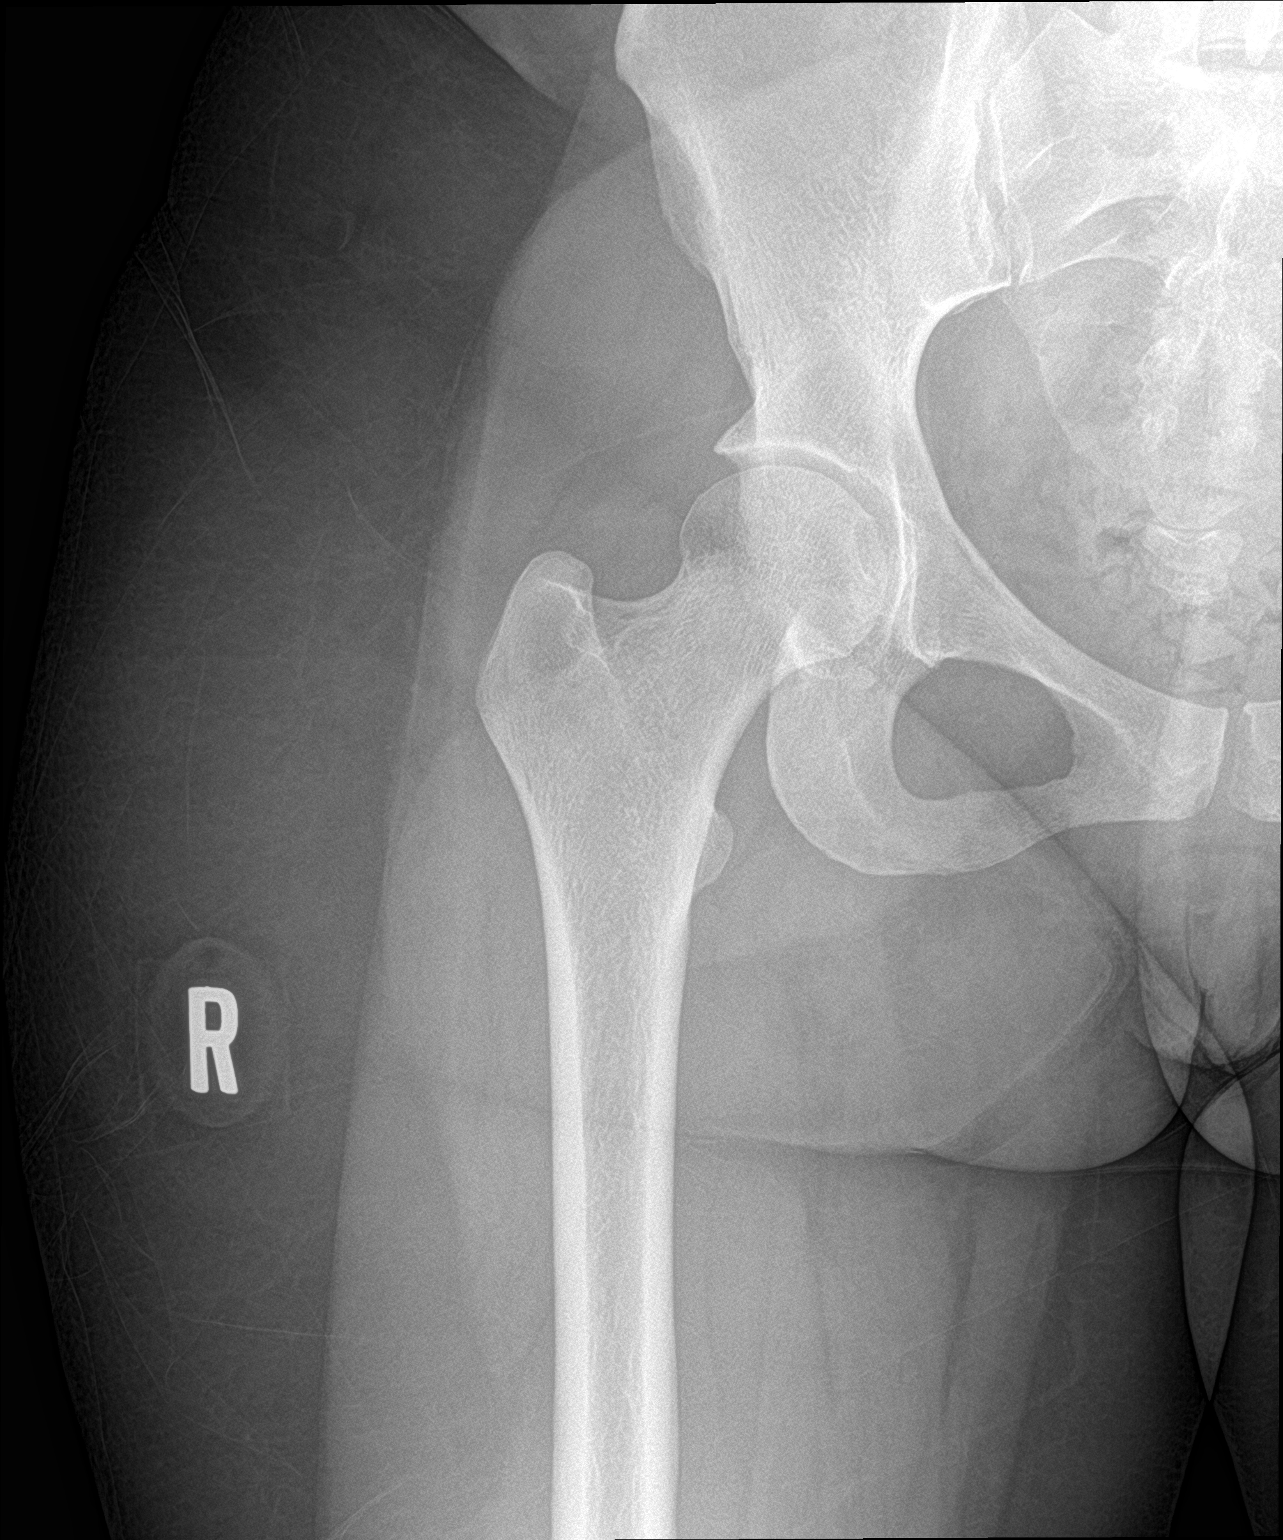

[hip lat]
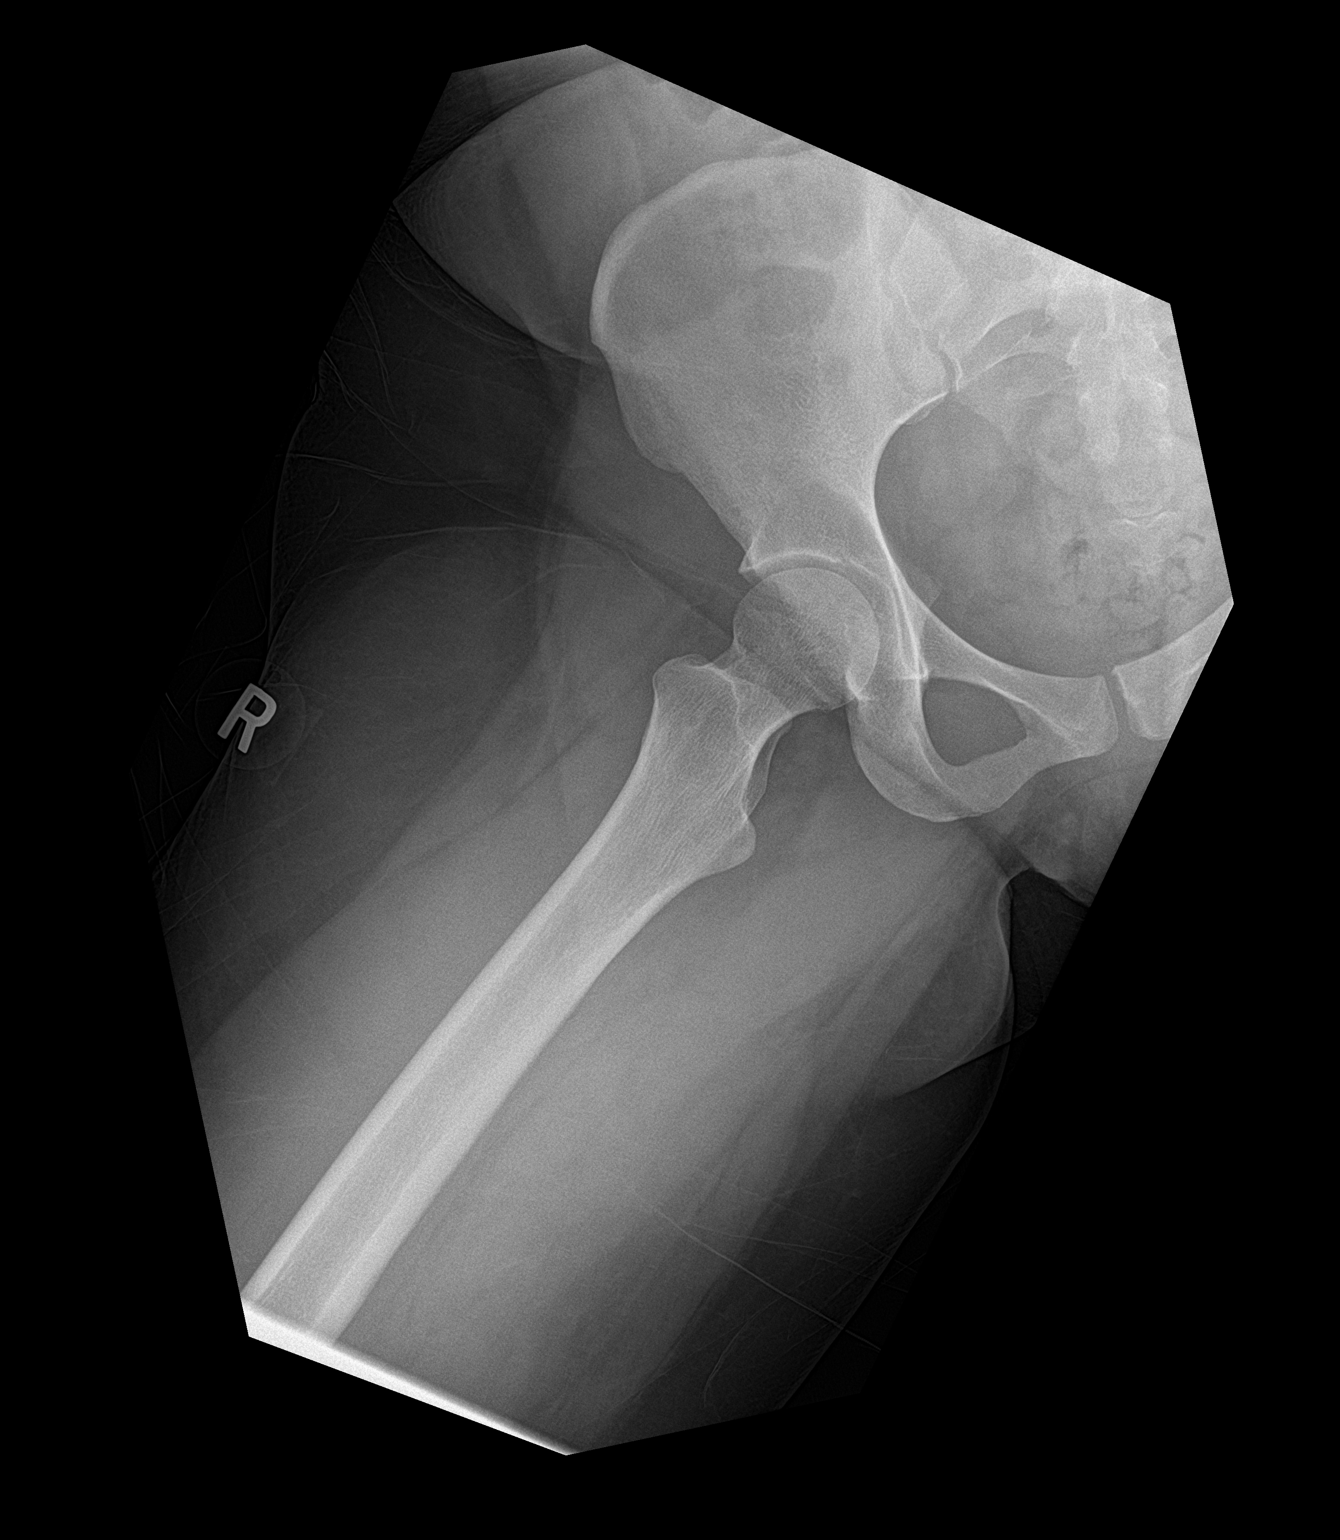

[3 of 3 positions shown; findings below may reference images not displayed]

FINDINGS: There is no evidence of hip fracture or dislocation. There is no
evidence of arthropathy or other focal bone abnormality.
IMPRESSION: Normal exam.

## 2021-05-02 NOTE — L&D Delivery Note (Signed)
NVD NOTE Date:  03/24/2022  Delivering Physician:  Bing Matter DO Anesthesia:   1.  Laboring epidural  Pre-Delivery Diagnosis:   1.  IUP at 39 4/[redacted] weeks gestation 2.  Gestation diabetes diet controlled 3.  Advanced maternal age   Post-Delivery Diagnosis:   Same  Delivery of liveborn female neonate  Procedure:  Spontaneous vaginal delivery  QBL: 885 mL  Complications:  None  LABOR AND DELIVERY SUMMARY The patient is a 36yo G5 P2204 at 38  4/[redacted] weeks gestation who presented to Labor and Delivery for scheduled IOL for GDMA1.  She was admitted, placed on continuous EFM and toco. Initial cervical check was 0/0/-4 and cervix was ripened with Cytotec followed by pitocin for labor augmentation. She had SROM at 0640 with clear fluid. She received an epidural for pain control.   The patient progressed to completely effaced and dilated at 1405 had an overwhelming urge to push and began pushing.  A liveborn female neonate was delivered via spontaneous vaginal delivery over an intact perineum from the OA position at 1458.  There was a tight double nuchal cord that was clamped and cut at the perineum.  Spontaneous cry was noted.  No shoulder dystocia was encountered.  Infant was placed on the maternal abdomen immediately following delivery.  Oropharynx and nasopharynx were bulb suctioned immediately following delivery. Cord blood was obtained. Infant was transferred to a warmer with an awaiting RN.  Additional segment of cord was doubly clamped and cut for cord gases.  Pitocin was added to the patient's IV.  The placenta delivered spontaneously and apparently intact with a 3 vessel cord at 1517 and discarded.  Uterus was noted to be firm.  No lacerations were identified.  All sponge, needle and instrument counts were correct at the end of the procedure.  The patient and baby remained in the LDR in stable condition. Dr. Ivin Poot was present for the entire delivery.  Baby Name: [name].  Delivery  Summary for Sherri Castaneda  Labor Events:   Preterm labor: No data found  Rupture date: 03/24/2022  Rupture time: 6:40 AM  Rupture type: Spontaneous  Fluid Color: Clear  Induction: No data found  Augmentation: No data found  Complications: No data found  Cervical ripening: No data found No data found   No data found     Delivery:   Episiotomy: No data found  Lacerations: No data found  Repair suture: No data found  Repair # of packets: No data found  Blood loss (ml): 259   Information for the patient's newborn:  Mitra, [place] [[id number]]   Delivery 03/24/2022 2:58 PM by  Vaginal, Spontaneous Sex:  female Gestational Age: 22w4dDelivery Clinician:   Living?:         APGARS  One minute Five minutes Ten minutes  Skin color:        Heart rate:        Grimace:        Muscle tone:        Breathing:        Totals: 7  9      Presentation/position:      Resuscitation:   Cord information:    Disposition of cord blood:     Blood gases sent?  Complications:   Placenta: Delivered:       appearance Newborn Measurements: Weight: 6 lb 15.1 oz (3150 g)  Height: 20.5"  Head circumference:    Chest circumference:    Other providers:  Additional  information: Forceps:   Vacuum:   Breech:   Observed anomalies      Dr. Bing Matter

## 2021-08-18 ENCOUNTER — Ambulatory Visit (INDEPENDENT_AMBULATORY_CARE_PROVIDER_SITE_OTHER): Payer: BC Managed Care – PPO

## 2021-08-18 ENCOUNTER — Encounter: Payer: Self-pay | Admitting: Obstetrics

## 2021-08-18 DIAGNOSIS — N912 Amenorrhea, unspecified: Secondary | ICD-10-CM

## 2021-08-18 DIAGNOSIS — Z3200 Encounter for pregnancy test, result unknown: Secondary | ICD-10-CM

## 2021-08-18 DIAGNOSIS — Z3201 Encounter for pregnancy test, result positive: Secondary | ICD-10-CM

## 2021-08-18 LAB — POCT URINE PREGNANCY: Preg Test, Ur: POSITIVE — AB

## 2021-08-18 NOTE — Progress Notes (Signed)
..  Sherri Castaneda presents today for UPT. She has no unusual complaints. ?LMP:06-20-21 ?   ?OBJECTIVE: Appears well, in no apparent distress.  ?OB History   ? ? Gravida  ?5  ? Para  ?4  ? Term  ?2  ? Preterm  ?2  ? AB  ?   ? Living  ?4  ?  ? ? SAB  ?   ? IAB  ?   ? Ectopic  ?   ? Multiple  ?   ? Live Births  ?4  ?   ?  ?  ? ?Home UPT Result:Positive ?In-Office UPT result:Positive ?I have reviewed the patient's medical, obstetrical, social, and family histories, and medications.  ? ?ASSESSMENT: Positive pregnancy test ?Pt is unsure about LMP because she was taking BC pills and had irregular cycles. ? ?PLAN ?Prenatal care to be completed at: [facility] ? ? ?

## 2021-09-06 IMAGING — DX DG FOOT COMPLETE 3+V*R*
3 series · 3 of 3 positions shown · non-contrast
Comparison: None.
COMPARISON: None.

Addendum:
CLINICAL DATA: Right foot pain after fall today.

EXAM:
RIGHT FOOT COMPLETE - 3+ VIEW

[foot supine dp]
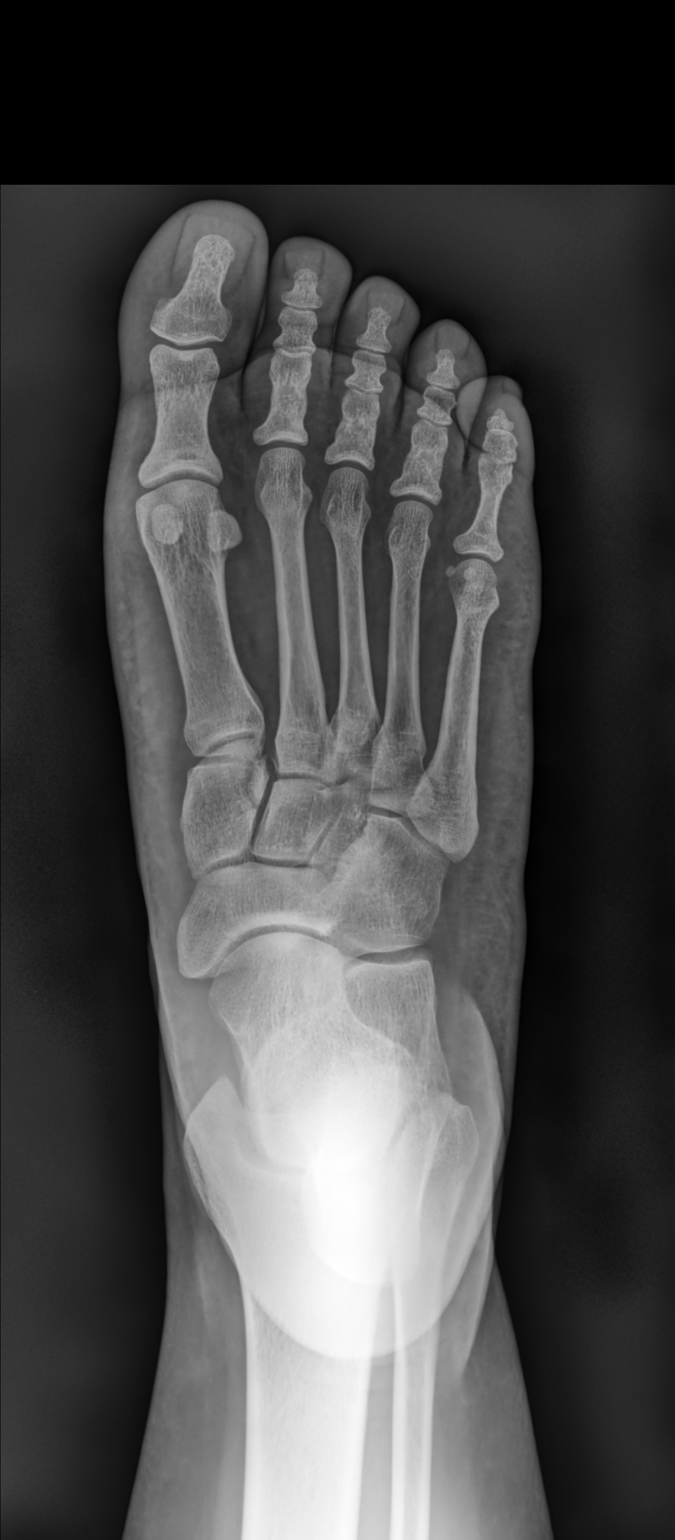

[foot medial oblique]
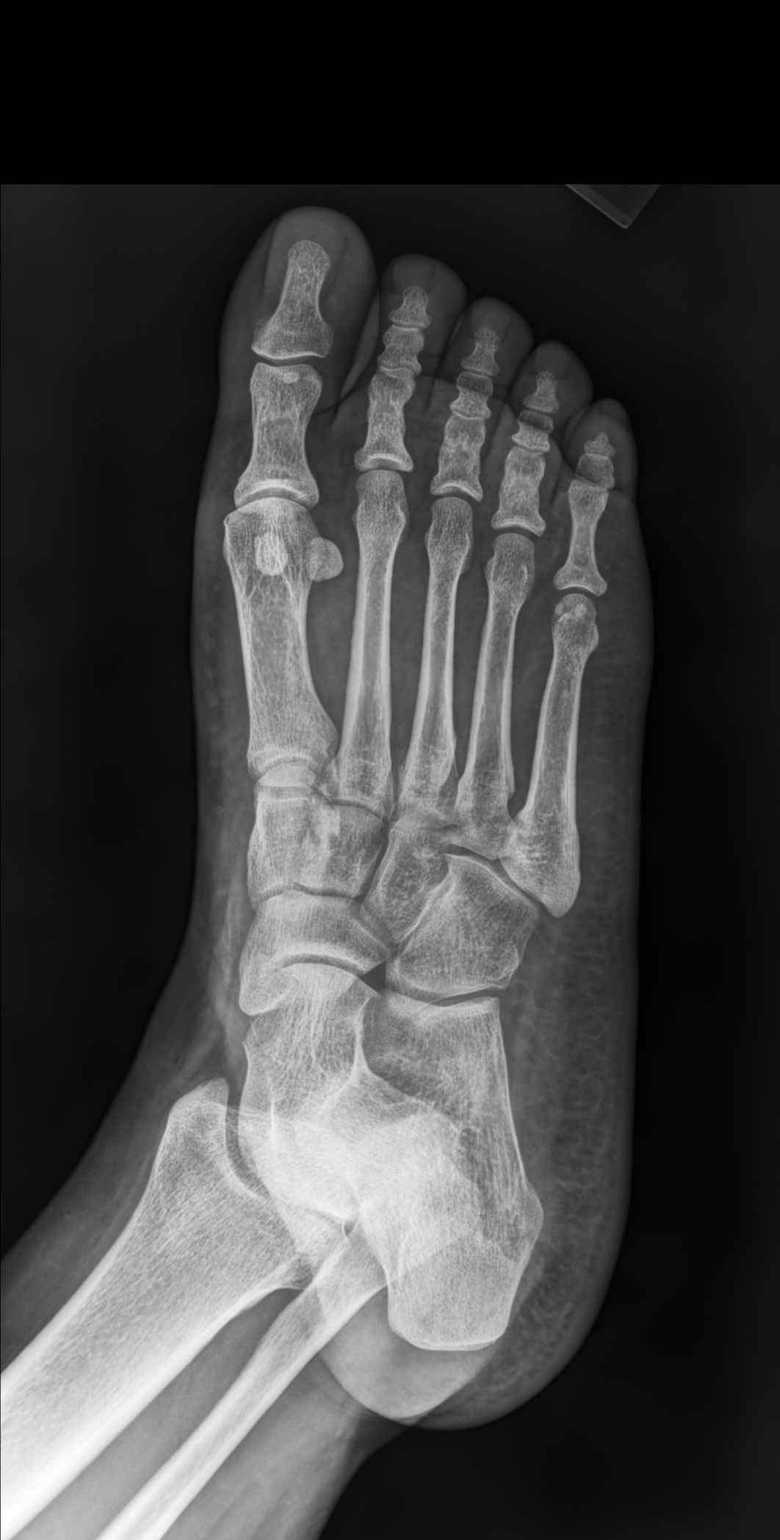

[foot supine lat]
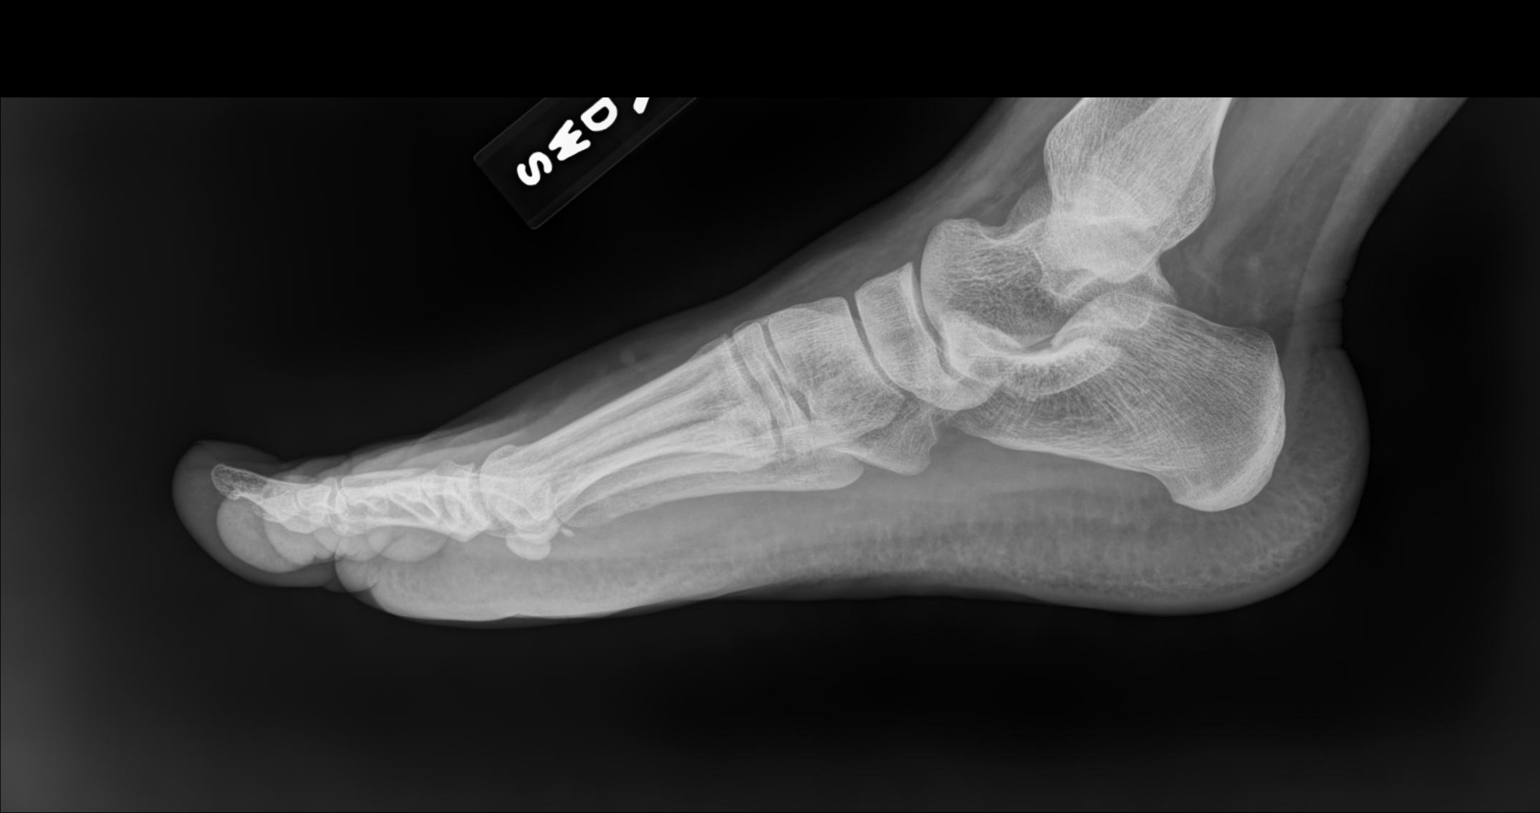

[3 of 3 positions shown; findings below may reference images not displayed]

FINDINGS: There is no evidence of fracture or dislocation. There is no
evidence of arthropathy or other focal bone abnormality. Soft
tissues are unremarkable.
IMPRESSION: Negative.

ADDENDUM:
Upon further review, curvilinear lucency is seen involving the
lateral aspect of the medial cuneiform bone, although definite
cortical disruption is not noted. Possibility of fracture cannot be
excluded, and CT scan may be performed for further evaluation.

*** End of Addendum ***
FINDINGS: There is no evidence of fracture or dislocation. There is no
evidence of arthropathy or other focal bone abnormality. Soft
tissues are unremarkable.
IMPRESSION: Negative.

## 2021-09-08 ENCOUNTER — Ambulatory Visit (INDEPENDENT_AMBULATORY_CARE_PROVIDER_SITE_OTHER): Payer: BC Managed Care – PPO

## 2021-09-08 VITALS — BP 124/71 | HR 86 | Ht 60.0 in | Wt 172.8 lb

## 2021-09-08 DIAGNOSIS — Z3A11 11 weeks gestation of pregnancy: Secondary | ICD-10-CM

## 2021-09-08 DIAGNOSIS — O3680X0 Pregnancy with inconclusive fetal viability, not applicable or unspecified: Secondary | ICD-10-CM

## 2021-09-08 DIAGNOSIS — Z3481 Encounter for supervision of other normal pregnancy, first trimester: Secondary | ICD-10-CM | POA: Diagnosis not present

## 2021-09-08 DIAGNOSIS — Z3490 Encounter for supervision of normal pregnancy, unspecified, unspecified trimester: Secondary | ICD-10-CM | POA: Insufficient documentation

## 2021-09-08 DIAGNOSIS — O0990 Supervision of high risk pregnancy, unspecified, unspecified trimester: Secondary | ICD-10-CM | POA: Insufficient documentation

## 2021-09-08 NOTE — Progress Notes (Cosign Needed)
New OB Intake ? ?I connected with  Sherri Castaneda on 09/08/21 at  8:15 AM EDT by in person  and verified that I am speaking with the correct person using two identifiers. Nurse is located at [facility] and pt is located at [place]. ? ?I discussed the limitations, risks, security and privacy concerns of performing an evaluation and management service by telephone and the availability of in person appointments. I also discussed with the patient that there may be a patient responsible charge related to this service. The patient expressed understanding and agreed to proceed. ? ?I explained I am completing New OB Intake today. We discussed her EDD of 03/27/22 that is based on LMP of 06/20/21. Pt is G5/P2204. I reviewed her allergies, medications, Medical/Surgical/OB history, and appropriate screenings. I informed her of [facility] services. Based on history, this is a/an  pregnancy uncomplicated .  ? ?Patient Active Problem List  ? Diagnosis Date Noted  ? Active labor 10/09/2013  ? Normal delivery 10/09/2013  ? Pregnancy related symphysis pain in third trimester, antepartum 09/25/2013  ? GBS (group B Streptococcus carrier), +RV culture, currently pregnant 09/21/2013  ? Supervision of other normal pregnancy 03/18/2013  ? Pregnancy related nausea and vomiting, antepartum 03/18/2013  ? Previous preterm delivery, antepartum 03/18/2013  ? ? ?Concerns addressed today ? ?Delivery Plans:  ?Plans to deliver at [facility].  ? ?MyChart/Babyscripts ?MyChart access verified. I explained pt will have some visits in office and some virtually. Babyscripts instructions given and order placed. Patient verifies receipt of registration text/e-mail. Account successfully created and app downloaded. ? ?Blood Pressure Cuff  ?Patient has private insurance; instructed to purchase blood pressure cuff and bring to first prenatal appt. Explained after first prenatal appt pt will check weekly and document in 60. ? ?Weight scale: Patient does / does not   have weight scale. Weight scale ordered for patient to pick up from [employer].  ? ?Anatomy US ?Explained first scheduled [place] will be around 19 weeks. Dating and viability scan performed today. ? ?Labs ?Discussed Johnsie Cancel genetic screening with patient. Would like both Panorama and Horizon drawn at new OB visit.Also if interested in genetic testing, tell patient she will need AFP 15-21 weeks to complete genetic testing .Routine prenatal labs needed. ? ?Covid Vaccine ?Patient has not covid vaccine.  ? ?Is patient a CenteringPregnancy candidate?  ?Declined ?Declined due to Other   ?Not a candidate due to NAIs patient a CenteringPregnancy candidate? Declined due to not interested   "Centering Patient" indicated on sticky note ?  ?Is patient interested in [place]?  ?No  "Interested in WB - Schedule next visit with CNM" on sticky note ? ?Informed patient of [facility] website  and placed link in her AVS.  ? ?Social Determinants of Health ?Food Insecurity: Patient denies food insecurity. ?WIC Referral: Patient is interested in referral to [facility]  ?Transportation: Patient denies transportation needs. ?Childcare: Discussed no children allowed at ultrasound appointments. Offered childcare services; patient declines childcare services at this time. ? ?Send link to Pregnancy Navigators ? ? ?Placed OB Box on problem list and updated ? ?First visit review ?I reviewed new OB appt with pt. I explained she will have a pelvic exam, ob bloodwork with genetic screening, and PAP smear. Explained pt will be seen by Verita Schneiders at first visit; encounter routed to appropriate provider. Explained that patient will be seen by pregnancy navigator following visit with provider. [facility] information placed in AVS.  ? ?Lucianne Lei, RN ?09/08/2021  8:14 AM  ?

## 2021-09-21 ENCOUNTER — Encounter: Payer: Self-pay | Admitting: Obstetrics & Gynecology

## 2021-09-21 ENCOUNTER — Ambulatory Visit (INDEPENDENT_AMBULATORY_CARE_PROVIDER_SITE_OTHER): Payer: BC Managed Care – PPO | Admitting: Obstetrics & Gynecology

## 2021-09-21 ENCOUNTER — Other Ambulatory Visit (HOSPITAL_COMMUNITY)
Admission: RE | Admit: 2021-09-21 | Discharge: 2021-09-21 | Disposition: A | Discharge status: Home / Self Care | Payer: BC Managed Care – PPO | Source: Ambulatory Visit | Attending: Obstetrics & Gynecology | Admitting: Obstetrics & Gynecology

## 2021-09-21 VITALS — BP 121/73 | HR 101 | Wt 175.0 lb

## 2021-09-21 DIAGNOSIS — O09521 Supervision of elderly multigravida, first trimester: Secondary | ICD-10-CM | POA: Insufficient documentation

## 2021-09-21 DIAGNOSIS — O09219 Supervision of pregnancy with history of pre-term labor, unspecified trimester: Secondary | ICD-10-CM

## 2021-09-21 DIAGNOSIS — Z3A00 Weeks of gestation of pregnancy not specified: Secondary | ICD-10-CM | POA: Insufficient documentation

## 2021-09-21 DIAGNOSIS — Z3A13 13 weeks gestation of pregnancy: Secondary | ICD-10-CM

## 2021-09-21 DIAGNOSIS — O0991 Supervision of high risk pregnancy, unspecified, first trimester: Secondary | ICD-10-CM

## 2021-09-21 DIAGNOSIS — O09211 Supervision of pregnancy with history of pre-term labor, first trimester: Secondary | ICD-10-CM

## 2021-09-21 DIAGNOSIS — O09529 Supervision of elderly multigravida, unspecified trimester: Secondary | ICD-10-CM | POA: Insufficient documentation

## 2021-09-21 LAB — OB RESULTS CONSOLE ANTIBODY SCREEN: Antibody Screen: NEGATIVE

## 2021-09-21 LAB — OB RESULTS CONSOLE RUBELLA ANTIBODY, IGM: Rubella: IMMUNE

## 2021-09-21 LAB — OB RESULTS CONSOLE ABO/RH: RH Type: POSITIVE

## 2021-09-21 LAB — OB RESULTS CONSOLE GC/CHLAMYDIA
Chlamydia: NEGATIVE
Neisseria Gonorrhea: NEGATIVE

## 2021-09-21 LAB — OB RESULTS CONSOLE HEPATITIS B SURFACE ANTIGEN: Hepatitis B Surface Ag: NEGATIVE

## 2021-09-21 LAB — OB RESULTS CONSOLE RPR: RPR: NONREACTIVE

## 2021-09-21 LAB — OB RESULTS CONSOLE HIV ANTIBODY (ROUTINE TESTING): HIV: NONREACTIVE

## 2021-09-21 LAB — HEPATITIS C ANTIBODY: HCV Ab: NEGATIVE

## 2021-09-21 MED ORDER — ASPIRIN 81 MG PO TBEC: 81.0000 mg | DELAYED_RELEASE_TABLET | Freq: Every day | ORAL | 2 refills | Status: DC

## 2021-09-21 NOTE — Progress Notes (Signed)
History:   Sherri Castaneda is a 36 y.o. [id number] at 19w2dby LMP being seen today for her first obstetrical visit.  Her obstetrical history is significant for advanced maternal age and two preterm deliveries followed by two term deliveries (was not on 17P or other intervention) . Patient does intend to breast feed. Pregnancy history fully reviewed.  Patient reports no complaints.      HISTORY: OB History  Gravida Para Term Preterm AB Living  '5 4 2 2 '$ 0 4  SAB IAB Ectopic Multiple Live Births  0 0 0 0 4    # Outcome Date GA Lbr Len/2nd Weight Sex Delivery Anes PTL Lv  5 Current           4 Term 10/09/13 458w1d1:04 / 00:04 7 lb 7.9 oz (3.4 kg) F Vag-Spont EPI  LIV     Name: ONMALLISSA, LORENZEN   Apgar1: 9  Apgar5: 9  3 Term 03/28/12 3969w5d:45 / 00:52 8 lb 0.2 oz (3.635 kg) F Vag-Spont EPI  LIV     Name: ONEWILL, SCHIER  Apgar1: 8  Apgar5: 9  2 Preterm 12/02/07 34w53w0dlb 2 oz (2.778 kg) F Vag-Spont EPI  LIV  1 Preterm 09/18/05 32w064w0db 12 oz (2.608 kg) F Vag-Spont EPI  LIV    Last pap smear was done 09/04/2017 and was normal  Past Medical History:  Diagnosis Date   Preterm labor    Delivery at 32 weeks and 34 weeks.   Seasonal allergies    Past Surgical History:  Procedure Laterality Date   BILATERAL KNEE ARTHROSCOPY Right    GYNECOLOGIC CRYOSURGERY  2006   Family History  Problem Relation Age of Onset   Cancer Mother    Cancer Maternal Grandmother    Cancer Father    Social History   Tobacco Use   Smoking status: Never   Smokeless tobacco: Never  Vaping Use   Vaping Use: Never used  Substance Use Topics   Alcohol use: No   Drug use: No   Allergies  Allergen Reactions   Flagyl [Metronidazole] Swelling   Pineapple Itching and Swelling   Latex Itching and Rash   No current outpatient medications on file prior to visit.   No current facility-administered medications on file prior to visit.    Review of Systems Pertinent items noted in HPI and  remainder of comprehensive ROS otherwise negative.  Physical Exam:   Vitals:   09/21/21 1502  BP: 121/73  Pulse: (!) 101  Weight: 175 lb (79.4 kg)   Fetal Heart Rate (bpm): 154   General: well-developed, well-nourished female in no acute distress  Breasts:  normal appearance, no masses or tenderness bilaterally, exam done in the presence of a chaperone.   Skin: normal coloration and turgor, no rashes  Neurologic: oriented, normal, negative, normal mood  Extremities: normal strength, tone, and muscle mass, ROM of all joints is normal  HEENT PERRLA, extraocular movement intact and sclera clear, anicteric  Neck supple and no masses  Cardiovascular: regular rate and rhythm  Respiratory:  no respiratory distress, normal breath sounds  Abdomen: soft, non-tender; bowel sounds normal; no masses,  no organomegaly  Pelvic: normal external genitalia, no lesions, normal vaginal mucosa, normal vaginal discharge, normal cervix, pap smear done. Significant bleeding after pap, ameliorated with pressure from fox swabs and silver nitrate application. Exam done in the presence of a chaperone.     Assessment:  Pregnancy: [id number] Patient Active Problem List   Diagnosis Date Noted   AMA (advanced maternal age) multigravida 35+ 09/21/2021   Supervision of high-risk pregnancy 09/08/2021   Previous two preterm deliveries followed by two term deliveries, antepartum 03/18/2013     Plan:    1. Previous two preterm deliveries followed by two term deliveries, antepartum Cervical length to be checked on anatomy scan - [place] MFM OB DETAIL +14 WK; Future - [place] MFM OB TRANSVAGINAL; Future  2. Multigravida of advanced maternal age in first trimester 3. [redacted] weeks gestation of pregnancy 4. Supervision of high risk pregnancy in first trimester - Cytology - PAP( [place]) - Culture, OB Urine - Genetic Screening - CBC/D/Plt+RPR+Rh+ABO+RubIgG... - HgB A1c - [place] MFM OB DETAIL +14 WK; Future - Comprehensive  metabolic panel - TSH Rfx on Abnormal to Free T4 - Enroll Patient in PreNatal Babyscripts - aspirin EC 81 MG tablet; Take 1 tablet (81 mg total) by mouth daily. Take after 12 weeks for prevention of preeclampsia later in pregnancy  Dispense: 300 tablet; Refill: 2 - [place] MFM OB TRANSVAGINAL; Future  Initial labs drawn. Continue prenatal vitamins. Problem list reviewed and updated. Genetic Screening discussed, First trimester screen, Quad screen, Panorama, and Horizon: declined. Ultrasound discussed; fetal anatomic survey: ordered. Anticipatory guidance about prenatal visits given including labs, ultrasounds, and testing. Discussed usage of the Babyscripts app for more information about pregnancy, and to track blood pressures. Also discussed usage of virtual visits as additional source of managing and completing prenatal visits.   Patient was encouraged to use MyChart to review results, send requests, and have questions addressed.   The nature of [place] [facility] with multiple MDs and Advanced Practice Providers was explained to patient; also emphasized that residents, students are part of our team. Routine obstetric precautions reviewed. Encouraged to seek out care at office or emergency room [facility] preferred) for urgent and/or emergent concerns. Return in about 4 weeks (around 10/19/2021) for OFFICE OB VISIT (MD only).     Verita Schneiders, MD, [place] for [employer], [place]

## 2021-09-22 ENCOUNTER — Encounter: Payer: Self-pay | Admitting: Obstetrics & Gynecology

## 2021-09-22 DIAGNOSIS — R7303 Prediabetes: Secondary | ICD-10-CM | POA: Insufficient documentation

## 2021-09-22 LAB — COMPREHENSIVE METABOLIC PANEL
ALT: 8 IU/L (ref 0–32)
AST: 12 IU/L (ref 0–40)
Albumin/Globulin Ratio: 1.5 (ref 1.2–2.2)
Albumin: 4.2 g/dL (ref 3.8–4.8)
Alkaline Phosphatase: 81 IU/L (ref 44–121)
BUN/Creatinine Ratio: 15 (ref 9–23)
BUN: 11 mg/dL (ref 6–20)
Bilirubin Total: <0.2 mg/dL (ref 0.0–1.2)
CO2: 20 mmol/L (ref 20–29)
Calcium: 9.6 mg/dL (ref 8.7–10.2)
Chloride: 102 mmol/L (ref 96–106)
Creatinine, Ser: 0.71 mg/dL (ref 0.57–1.00)
Globulin, Total: 2.8 g/dL (ref 1.5–4.5)
Glucose: 85 mg/dL (ref 70–99)
Potassium: 4 mmol/L (ref 3.5–5.2)
Sodium: 136 mmol/L (ref 134–144)
Total Protein: 7 g/dL (ref 6.0–8.5)
eGFR: 112 mL/min/{1.73_m2} (ref >59)

## 2021-09-22 LAB — CBC/D/PLT+RPR+RH+ABO+RUBIGG...
Antibody Screen: NEGATIVE
Basophils Absolute: 0 10*3/uL (ref 0.0–0.2)
Basos: 0 %
EOS (ABSOLUTE): 0.1 10*3/uL (ref 0.0–0.4)
Eos: 1 %
HCV Ab: NONREACTIVE
HIV Screen 4th Generation wRfx: NONREACTIVE
Hematocrit: 34.3 % (ref 34.0–46.6)
Hemoglobin: 11.4 g/dL (ref 11.1–15.9)
Hepatitis B Surface Ag: NEGATIVE
Immature Grans (Abs): 0.1 10*3/uL (ref 0.0–0.1)
Immature Granulocytes: 1 %
Lymphocytes Absolute: 2 10*3/uL (ref 0.7–3.1)
Lymphs: 23 %
MCH: 25.5 pg — ABNORMAL LOW (ref 26.6–33.0)
MCHC: 33.2 g/dL (ref 31.5–35.7)
MCV: 77 fL — ABNORMAL LOW (ref 79–97)
Monocytes Absolute: 0.7 10*3/uL (ref 0.1–0.9)
Monocytes: 8 %
Neutrophils Absolute: 5.6 10*3/uL (ref 1.4–7.0)
Neutrophils: 67 %
Platelets: 285 10*3/uL (ref 150–450)
RBC: 4.47 x10E6/uL (ref 3.77–5.28)
RDW: 13.4 % (ref 11.7–15.4)
RPR Ser Ql: NONREACTIVE
Rh Factor: POSITIVE
Rubella Antibodies, IGG: 6.18 index (ref >0.99)
WBC: 8.4 10*3/uL (ref 3.4–10.8)

## 2021-09-22 LAB — HCV INTERPRETATION

## 2021-09-22 LAB — TSH RFX ON ABNORMAL TO FREE T4: TSH: 0.433 u[IU]/mL — ABNORMAL LOW (ref 0.450–4.500)

## 2021-09-22 LAB — T4F: T4,Free (Direct): 1.14 ng/dL (ref 0.82–1.77)

## 2021-09-22 LAB — HEMOGLOBIN A1C
Est. average glucose Bld gHb Est-mCnc: 123 mg/dL
Hgb A1c MFr Bld: 5.9 % — ABNORMAL HIGH (ref 4.8–5.6)

## 2021-09-22 NOTE — Progress Notes (Signed)
The hemoglobin A1C of 5.9 at [redacted] weeks gestation is indicative of prediabetes.   Early 2 hr GTT recommended to test for GDM.  Please call to inform patient of results and recommendations. Problem list updated.  Verita Schneiders, MD

## 2021-09-23 ENCOUNTER — Inpatient Hospital Stay (HOSPITAL_COMMUNITY)
Admission: AD | Admit: 2021-09-23 | Discharge: 2021-09-23 | Disposition: A | Discharge status: Home / Self Care | Payer: BC Managed Care – PPO | Attending: Obstetrics and Gynecology | Admitting: Obstetrics and Gynecology

## 2021-09-23 ENCOUNTER — Encounter: Payer: Self-pay | Admitting: Obstetrics & Gynecology

## 2021-09-23 ENCOUNTER — Encounter (HOSPITAL_COMMUNITY): Payer: Self-pay | Admitting: Obstetrics and Gynecology

## 2021-09-23 DIAGNOSIS — N888 Other specified noninflammatory disorders of cervix uteri: Secondary | ICD-10-CM

## 2021-09-23 DIAGNOSIS — O0992 Supervision of high risk pregnancy, unspecified, second trimester: Secondary | ICD-10-CM

## 2021-09-23 DIAGNOSIS — O26891 Other specified pregnancy related conditions, first trimester: Secondary | ICD-10-CM | POA: Diagnosis present

## 2021-09-23 DIAGNOSIS — R109 Unspecified abdominal pain: Secondary | ICD-10-CM | POA: Insufficient documentation

## 2021-09-23 DIAGNOSIS — O09521 Supervision of elderly multigravida, first trimester: Secondary | ICD-10-CM

## 2021-09-23 DIAGNOSIS — O209 Hemorrhage in early pregnancy, unspecified: Secondary | ICD-10-CM | POA: Insufficient documentation

## 2021-09-23 DIAGNOSIS — Z3491 Encounter for supervision of normal pregnancy, unspecified, first trimester: Secondary | ICD-10-CM

## 2021-09-23 DIAGNOSIS — Z3A13 13 weeks gestation of pregnancy: Secondary | ICD-10-CM | POA: Diagnosis not present

## 2021-09-23 LAB — URINALYSIS, ROUTINE W REFLEX MICROSCOPIC
Bilirubin Urine: NEGATIVE
Glucose, UA: NEGATIVE mg/dL
Ketones, ur: NEGATIVE mg/dL
Leukocytes,Ua: NEGATIVE
Nitrite: NEGATIVE
Protein, ur: NEGATIVE mg/dL
Specific Gravity, Urine: 1.019 (ref 1.005–1.030)
pH: 6 (ref 5.0–8.0)

## 2021-09-23 LAB — CYTOLOGY - PAP
Chlamydia: NEGATIVE
Comment: NEGATIVE
Comment: NEGATIVE
Comment: NEGATIVE
Comment: NORMAL
Diagnosis: NEGATIVE
High risk HPV: NEGATIVE
Neisseria Gonorrhea: NEGATIVE
Trichomonas: NEGATIVE

## 2021-09-23 LAB — HEMOGLOBIN AND HEMATOCRIT, BLOOD
HCT: 33.5 % — ABNORMAL LOW (ref 36.0–46.0)
Hemoglobin: 11.1 g/dL — ABNORMAL LOW (ref 12.0–15.0)

## 2021-09-23 MED ORDER — ACETAMINOPHEN 500 MG PO TABS
1000.0000 mg | ORAL_TABLET | Freq: Once | ORAL | Status: AC
  Administered 2021-09-23: 1000 mg via ORAL
  Filled 2021-09-23: qty 2

## 2021-09-23 NOTE — MAU Provider Note (Signed)
History     CSN: [id number]  Arrival date and time: 09/23/21 1535  Event Date/Time  First Provider Initiated Contact with Patient 09/23/21 1700     Chief Complaint  Patient presents with   Vaginal Bleeding   Abdominal Pain   HPI Sherri Castaneda is a 36 y.o. [id number] at 68w4dwho presents to MAU with chief complaint of abdominal pain and vaginal bleeding. Patient endorses spotting during her pap collection on 09/21/2021. She states her pap experience was prolonged due to persistent bleeding and multiple swabs were used to manage her bleeding before the speculum was removed. She has continued to visualize spotting since her appointment. She denies dysuria, fever or recent illness.  Patient also reports abdominal pain and tenderness, onset also with her prenatal visit 09/21/2021. She states the Doppler was used with heavy pressure to find her baby's heartbeat. Her pain score is 2/10. She has not taken medication for this complaint.  [place] [facility]  OB History     Gravida  5   Para  4   Term  2   Preterm  2   AB      Living  4      SAB      IAB      Ectopic      Multiple      Live Births  4           Past Medical History:  Diagnosis Date   Prediabetes    Preterm labor    Delivery at 32 weeks and 34 weeks.   Seasonal allergies     Past Surgical History:  Procedure Laterality Date   BILATERAL KNEE ARTHROSCOPY Right    GYNECOLOGIC CRYOSURGERY  2006    Family History  Problem Relation Age of Onset   Cancer Mother    Cancer Maternal Grandmother    Cancer Father     Social History   Tobacco Use   Smoking status: Never   Smokeless tobacco: Never  Vaping Use   Vaping Use: Never used  Substance Use Topics   Alcohol use: No   Drug use: No    Allergies:  Allergies  Allergen Reactions   Flagyl [Metronidazole] Swelling   Pineapple Itching and Swelling   Latex Itching and Rash    No medications prior to admission.     Review of Systems  Gastrointestinal:  Positive for abdominal pain.  Genitourinary:  Positive for vaginal bleeding.  All other systems reviewed and are negative. Physical Exam   Blood pressure 123/75, pulse 90, temperature 98.8 F (37.1 C), temperature source Oral, resp. rate 18, height 5' (1.524 m), weight 80.1 kg, last menstrual period 06/20/2021, SpO2 100 %.  Physical Exam Vitals and nursing note reviewed. Exam conducted with a chaperone present.  Cardiovascular:     Rate and Rhythm: Normal rate.     Heart sounds: Normal heart sounds.  Pulmonary:     Effort: Pulmonary effort is normal.     Breath sounds: Normal breath sounds.  Abdominal:     Palpations: Abdomen is soft.  Genitourinary:    Comments: Pelvic exam: External genitalia normal, vaginal walls pink and well rugated, cervix visually closed, no lesions noted. Negligible circle of dark red blood surround cervix. Fox swab not utilized her patient request.   Skin:    Capillary Refill: Capillary refill takes less than 2 seconds.  Neurological:     Mental Status: She is oriented to person, place, and time.  Psychiatric:        Mood and Affect: Mood normal.        Behavior: Behavior normal.    MAU Course  Procedures  MDM Orders Placed This Encounter  Procedures   Urinalysis, Routine w reflex microscopic Urine, Clean Catch   Hemoglobin and hematocrit, blood   Discharge patient   Patient Vitals for the past 24 hrs:  BP Temp Temp src Pulse Resp SpO2 Height Weight  09/23/21 1754 123/75 -- -- 90 -- -- -- --  09/23/21 1650 121/73 -- -- 87 18 -- -- --  09/23/21 1635 125/75 98.8 F (37.1 C) Oral 100 19 100 % 5' (1.524 m) 80.1 kg   Results for orders placed or performed during the hospital encounter of 09/23/21 (from the past 24 hour(s))  Hemoglobin and hematocrit, blood     Status: Abnormal   Collection Time: 09/23/21  5:00 PM  Result Value Ref Range   Hemoglobin 11.1 (L) 12.0 - 15.0 g/dL   HCT 33.5 (L) 36.0 -  46.0 %   Meds ordered this encounter  Medications   acetaminophen (TYLENOL) tablet 1,000 mg   Assessment and Plan  --36 y.o. T4H9622 at [redacted]w[redacted]d --FHT 151 by Doppler --No active bleeding --Hgb 11.4 on 09/21/21 now 11.1 --Musculoskeletal abdominal pain --Blood type O POS --Discharge home in stable condition  F/U: [facility] prenatal [facility] at [facility]/19/21  SDarlina Rumpf [place] MSN, CNM 09/23/2021, 6:41 PM

## 2021-09-23 NOTE — MAU Note (Signed)
...  Sherri Castaneda is a 36 y.o. at 39w4dhere in MAU reporting: Light vaginal bleeding and intermittent pelvic cramping since this past Tuesday after having a pap smear. Denies LOF.    Onset of complaint: Tuesday, 5/23. Pain score: 2/10 pelvis   FHT: 151 doppler Lab orders placed from triage: UA

## 2021-09-23 NOTE — Discharge Instructions (Signed)

## 2021-09-24 LAB — URINE CULTURE, OB REFLEX

## 2021-09-24 LAB — CULTURE, OB URINE

## 2021-09-25 MED ORDER — CEPHALEXIN 500 MG PO CAPS: 500.0000 mg | ORAL_CAPSULE | Freq: Four times a day (QID) | ORAL | 2 refills | Status: DC

## 2021-09-25 NOTE — Addendum Note (Signed)
Addended by: Mora Bellman on: 09/25/2021 02:30 PM   Modules accepted: Orders

## 2021-09-29 ENCOUNTER — Telehealth: Payer: Self-pay

## 2021-09-29 NOTE — Telephone Encounter (Signed)
Left message for patient to reschedule to 5/30 or 5/28 due to only 1 provider on 11/01/21

## 2021-10-13 ENCOUNTER — Other Ambulatory Visit: Payer: Self-pay

## 2021-10-13 ENCOUNTER — Other Ambulatory Visit: Payer: BC Managed Care – PPO

## 2021-10-13 DIAGNOSIS — O0992 Supervision of high risk pregnancy, unspecified, second trimester: Secondary | ICD-10-CM

## 2021-10-14 ENCOUNTER — Other Ambulatory Visit: Payer: Self-pay

## 2021-10-14 NOTE — Progress Notes (Signed)
Rx for breast pump faxed

## 2021-10-15 LAB — AFP, SERUM, OPEN SPINA BIFIDA
AFP MoM: 1.16
AFP Value: 40.1 ng/mL
Gest. Age on Collection Date: 16 weeks
Maternal Age At EDD: 37.5 yr
OSBR Risk 1 IN: 10000
Test Results:: NEGATIVE
Weight: 175 [lb_av]

## 2021-10-19 ENCOUNTER — Ambulatory Visit (INDEPENDENT_AMBULATORY_CARE_PROVIDER_SITE_OTHER): Payer: BC Managed Care – PPO | Admitting: Obstetrics & Gynecology

## 2021-10-19 VITALS — BP 122/74 | HR 86 | Wt 178.0 lb

## 2021-10-19 DIAGNOSIS — O09522 Supervision of elderly multigravida, second trimester: Secondary | ICD-10-CM | POA: Diagnosis not present

## 2021-10-19 DIAGNOSIS — O09212 Supervision of pregnancy with history of pre-term labor, second trimester: Secondary | ICD-10-CM

## 2021-10-19 DIAGNOSIS — Z3A17 17 weeks gestation of pregnancy: Secondary | ICD-10-CM | POA: Diagnosis not present

## 2021-10-19 DIAGNOSIS — O0992 Supervision of high risk pregnancy, unspecified, second trimester: Secondary | ICD-10-CM | POA: Diagnosis not present

## 2021-10-19 DIAGNOSIS — O09219 Supervision of pregnancy with history of pre-term labor, unspecified trimester: Secondary | ICD-10-CM

## 2021-10-19 MED ORDER — FERROUS SULFATE 325 (65 FE) MG PO TABS: 325.0000 mg | ORAL_TABLET | ORAL | 3 refills | Status: DC

## 2021-10-19 MED ORDER — PROMETHAZINE HCL 25 MG PO TABS
25.0000 mg | ORAL_TABLET | Freq: Four times a day (QID) | ORAL | 2 refills | Status: DC | PRN
Start: 2021-10-19 — End: 2022-03-09

## 2021-10-19 NOTE — Progress Notes (Signed)
   PRENATAL VISIT NOTE  Subjective:  Sherri Castaneda is a 36 y.o. U9W1191 at 14w2dbeing seen today for ongoing prenatal care.  She is currently monitored for the following issues for this high-risk pregnancy and has Previous two preterm deliveries followed by two term deliveries, antepartum; Supervision of high-risk pregnancy; AMA (advanced maternal age) multigravida 35+; and Prediabetes on their problem list.  Patient reports nausea and dizzy.  Contractions: Not present. Vag. Bleeding: None.  Movement: Present. Denies leaking of fluid.   The following portions of the patient's history were reviewed and updated as appropriate: allergies, current medications, past family history, past medical history, past social history, past surgical history and problem list.   Objective:   Vitals:   10/19/21 1536  BP: 122/74  Pulse: 86  Weight: 178 lb (80.7 kg)    Fetal Status: Fetal Heart Rate (bpm): 140   Movement: Present     General:  Alert, oriented and cooperative. Patient is in no acute distress.  Skin: Skin is warm and dry. No rash noted.   Cardiovascular: Normal heart rate noted  Respiratory: Normal respiratory effort, no problems with respiration noted  Abdomen: Soft, gravid, appropriate for gestational age.  Pain/Pressure: Absent     Pelvic: Cervical exam deferred        Extremities: Normal range of motion.     Mental Status: Normal mood and affect. Normal behavior. Normal judgment and thought content.   Assessment and Plan:  Pregnancy: GY7W2956at 144w2d. Supervision of high risk pregnancy in second trimester Mild anemia - ferrous sulfate 325 (65 FE) MG tablet; Take 1 tablet (325 mg total) by mouth every other day.  Dispense: 30 tablet; Refill: 3 - promethazine (PHENERGAN) 25 MG tablet; Take 1 tablet (25 mg total) by mouth every 6 (six) hours as needed for nausea or vomiting.  Dispense: 30 tablet; Refill: 2  2. Previous two preterm deliveries followed by two term deliveries,  antepartum 2 hr GTT scheduled  3. Multigravida of advanced maternal age in second trimester F/u in 2 weeks for Us[place]oreaPreterm labor symptoms and general obstetric precautions including but not limited to vaginal bleeding, contractions, leaking of fluid and fetal movement were reviewed in detail with the patient. Please refer to After Visit Summary for other counseling recommendations.   Return in about 4 weeks (around 11/16/2021).  Future Appointments  Date Time Provider [place]6/30/2023  7:30 [facility]20[facility]  7:45 AM [facility] US[facility]03[facility]20[facility]  8:00 AM [facility] None    JaEmeterio ReeveMD

## 2021-10-19 NOTE — Progress Notes (Signed)
Pt states she has been having some dizziness and increase in fatigue. Pt is scheduled for early GTT.

## 2021-10-29 ENCOUNTER — Encounter: Payer: Self-pay | Admitting: *Deleted

## 2021-10-29 ENCOUNTER — Ambulatory Visit: Payer: BC Managed Care – PPO | Attending: Obstetrics & Gynecology

## 2021-10-29 ENCOUNTER — Ambulatory Visit: Payer: BC Managed Care – PPO | Admitting: *Deleted

## 2021-10-29 ENCOUNTER — Other Ambulatory Visit: Payer: Self-pay | Admitting: *Deleted

## 2021-10-29 VITALS — BP 107/68 | HR 89

## 2021-10-29 DIAGNOSIS — O09522 Supervision of elderly multigravida, second trimester: Secondary | ICD-10-CM

## 2021-10-29 DIAGNOSIS — O09521 Supervision of elderly multigravida, first trimester: Secondary | ICD-10-CM | POA: Insufficient documentation

## 2021-10-29 DIAGNOSIS — O09219 Supervision of pregnancy with history of pre-term labor, unspecified trimester: Secondary | ICD-10-CM | POA: Insufficient documentation

## 2021-10-29 DIAGNOSIS — O0992 Supervision of high risk pregnancy, unspecified, second trimester: Secondary | ICD-10-CM | POA: Diagnosis present

## 2021-10-29 DIAGNOSIS — O0991 Supervision of high risk pregnancy, unspecified, first trimester: Secondary | ICD-10-CM | POA: Diagnosis present

## 2021-10-29 DIAGNOSIS — O09212 Supervision of pregnancy with history of pre-term labor, second trimester: Secondary | ICD-10-CM

## 2021-10-29 DIAGNOSIS — Z6833 Body mass index (BMI) 33.0-33.9, adult: Secondary | ICD-10-CM

## 2021-10-29 DIAGNOSIS — E669 Obesity, unspecified: Secondary | ICD-10-CM

## 2021-10-29 DIAGNOSIS — O99212 Obesity complicating pregnancy, second trimester: Secondary | ICD-10-CM

## 2021-10-29 DIAGNOSIS — Z3A18 18 weeks gestation of pregnancy: Secondary | ICD-10-CM

## 2021-10-29 DIAGNOSIS — O09899 Supervision of other high risk pregnancies, unspecified trimester: Secondary | ICD-10-CM

## 2021-11-01 ENCOUNTER — Ambulatory Visit: Payer: BC Managed Care – PPO

## 2021-11-09 ENCOUNTER — Other Ambulatory Visit: Payer: BC Managed Care – PPO

## 2021-11-17 ENCOUNTER — Encounter: Payer: BC Managed Care – PPO | Admitting: Obstetrics & Gynecology

## 2021-11-18 ENCOUNTER — Ambulatory Visit: Payer: BC Managed Care – PPO | Admitting: Registered"

## 2021-11-18 ENCOUNTER — Encounter: Payer: BC Managed Care – PPO | Attending: Physician Assistant | Admitting: Registered"

## 2021-11-18 DIAGNOSIS — Z713 Dietary counseling and surveillance: Secondary | ICD-10-CM | POA: Diagnosis not present

## 2021-11-18 DIAGNOSIS — R7303 Prediabetes: Secondary | ICD-10-CM | POA: Insufficient documentation

## 2021-11-18 NOTE — Progress Notes (Signed)
Medical Nutrition Therapy  Appointment Start time:  [phone]  Appointment End time:  0920  Estimated Due Date 03/28/22; [redacted]w[redacted]d Primary concerns today: prediabetes - wants to have healthy pregnancy and prevent T2D Referral diagnosis: pre-diabetes Preferred learning style: no preference indicated Learning readiness: ready, change in progress  NUTRITION ASSESSMENT  Anthropometrics  Not assessed this visit   Clinical Medical Hx: IBS, prediabetes, iron deficiency,  Medications: iron since June, prenatal gummies vitamin, progesterone to keep cervix closed, aspirin, acid reflux med Labs: A1c 5.9% Notable Signs/Symptoms: low energy, pain due to knee injury  Lifestyle & Dietary Hx Pt reports only family history that she knows of is her mother had medication induced diabetes during cancer treatment.  Pt reports she was sedentary x4 yrs after knee injury. Pt states she recently has gone back to work and now is walking daily, 60 min if pain level allows.  Pt states she does most of the cooking for her family and due to time constraints is having to rely on take out more than she would like. Pt states her 2 older children as starting to help with cooking   Pt reports prior to pregnancy was drinking 2-3 cans soda per day. Pt states since pregnant has not tolerated regular soda.  Pt states she has IBS with constipation. Pt takes iron supplement every other day.  Estimated daily fluid intake: >70 oz water, orange juice Supplements: prenatal vitamin, iron Sleep: not assessed Stress / self-care: not assessed Current average weekly physical activity: walking 30-60 min 7x/week   24-Hr Dietary Recall First Meal: granola bar if not time to eat OR bacon (2-3 slices), 2 eggs, 1 toast OR cheerious, banana Snack: celery with PB or Ranch OR dry cereal OR fruit OR sweetened applesauce Second Meal: person pizza [employer] Snack:  Third Meal: when cooking at home meat, starch, vegetable. Last night had 2  soft tacos and a few sips of a Pepsi ([employer]) Snack: fruit Beverages: water, juice in the morning  NUTRITION DIAGNOSIS  NB-1.1 Food and nutrition-related knowledge deficit As related to carbohydrate content of foods.  As evidenced by not realizing sources such as jackfruit and cranberry products.  NUTRITION INTERVENTION  Nutrition education (E-1) on the following topics:  Balanced meals and snacks Snack ideas Appropriate serving sizes of fruit  Role of exercise / stress Iron + Vit c  Handouts Provided Include  Snack ideas for Gestational Diabetes Gestational Diabetes Handout Glucose log book  Learning Style & Readiness for Change Teaching method utilized: Visual & Auditory  Demonstrated degree of understanding via: Teach Back  Barriers to learning/adherence to lifestyle change: none  Goals Established by Pt Take your iron supplement with dinner instead of at night to help absorption Record your BG on the log sheet Continue drinking plenty of water Call Nutrition and Diabetes for follow-up visit if desired  MONITORING & EVALUATION Dietary intake, weekly physical activity, and blood sugar in prn.

## 2021-11-19 NOTE — Patient Instructions (Signed)
Take your iron supplement with dinner instead of at night to help absorption Record your BG on the log sheet Continue drinking plenty of water Call Nutrition and Diabetes for follow-up visit if desired

## 2021-11-22 NOTE — Progress Notes (Signed)
Medical Nutrition Therapy  Appointment Start time:  [phone]  Appointment End time:  0920   Estimated Due Date 03/28/22; [redacted]w[redacted]d  Primary concerns today: prediabetes - wants to have healthy pregnancy and prevent T2D Referral diagnosis: pre-diabetes Preferred learning style: no preference indicated Learning readiness: ready, change in progress   NUTRITION ASSESSMENT  Anthropometrics  Not assessed this visit    Clinical Medical Hx: IBS, prediabetes, iron deficiency,  Medications: iron since June, prenatal gummies vitamin, progesterone to keep cervix closed, aspirin, acid reflux med Labs: A1c 5.9% Notable Signs/Symptoms: low energy, pain due to knee injury   Lifestyle & Dietary Hx Pt reports only family history that she knows of is her mother had medication induced diabetes during cancer treatment.   Pt reports she was sedentary x4 yrs after knee injury. Pt states she recently has gone back to work and now is walking daily, 60 min if pain level allows.   Pt states she does most of the cooking for her family and due to time constraints is having to rely on take out more than she would like. Pt states her 2 older children as starting to help with cooking    Pt reports prior to pregnancy was drinking 2-3 cans soda per day. Pt states since pregnant has not tolerated regular soda.   Pt states she has IBS with constipation. Pt takes iron supplement every other day.   Estimated daily fluid intake: >70 oz water, orange juice Supplements: prenatal vitamin, iron Sleep: not assessed Stress / self-care: not assessed Current average weekly physical activity: walking 30-60 min 7x/week    24-Hr Dietary Recall First Meal: granola bar if not time to eat OR bacon (2-3 slices), 2 eggs, 1 toast OR cheerious, banana Snack: celery with PB or Ranch OR dry cereal OR fruit OR sweetened applesauce Second Meal: person pizza [employer] Snack:  Third Meal: when cooking at home meat, starch, vegetable. Last  night had 2 soft tacos and a few sips of a Pepsi ([employer]) Snack: fruit Beverages: water, juice in the morning   NUTRITION DIAGNOSIS  NB-1.1 Food and nutrition-related knowledge deficit As related to carbohydrate content of foods.  As evidenced by not realizing sources such as jackfruit and cranberry products.   NUTRITION INTERVENTION  Nutrition education (E-1) on the following topics:  Balanced meals and snacks Snack ideas Appropriate serving sizes of fruit  Role of exercise / stress Iron + Vit c   Handouts Provided Include  Snack ideas for Gestational Diabetes Gestational Diabetes Handout Glucose log book   Learning Style & Readiness for Change Teaching method utilized: Visual & Auditory  Demonstrated degree of understanding via: Teach Back  Barriers to learning/adherence to lifestyle change: none   Goals Established by Pt Take your iron supplement with dinner instead of at night to help absorption Record your BG on the log sheet Continue drinking plenty of water Call Nutrition and Diabetes for follow-up visit if desired

## 2021-11-24 NOTE — Progress Notes (Signed)
Patient was assessed and managed by nursing staff during this encounter. I have reviewed the chart and agree with the documentation and plan. I have also made any necessary editorial changes.  Emeterio Reeve, MD 11/24/2021 2:35 PM

## 2021-11-26 ENCOUNTER — Ambulatory Visit: Payer: BC Managed Care – PPO | Admitting: *Deleted

## 2021-11-26 ENCOUNTER — Ambulatory Visit: Payer: BC Managed Care – PPO | Attending: Obstetrics and Gynecology

## 2021-11-26 VITALS — BP 108/64 | HR 88

## 2021-11-26 DIAGNOSIS — O99212 Obesity complicating pregnancy, second trimester: Secondary | ICD-10-CM | POA: Diagnosis not present

## 2021-11-26 DIAGNOSIS — O09522 Supervision of elderly multigravida, second trimester: Secondary | ICD-10-CM

## 2021-11-26 DIAGNOSIS — Z6833 Body mass index (BMI) 33.0-33.9, adult: Secondary | ICD-10-CM

## 2021-11-26 DIAGNOSIS — E669 Obesity, unspecified: Secondary | ICD-10-CM

## 2021-11-26 DIAGNOSIS — O0992 Supervision of high risk pregnancy, unspecified, second trimester: Secondary | ICD-10-CM | POA: Diagnosis present

## 2021-11-26 DIAGNOSIS — O09899 Supervision of other high risk pregnancies, unspecified trimester: Secondary | ICD-10-CM | POA: Diagnosis present

## 2021-11-26 DIAGNOSIS — Z3A22 22 weeks gestation of pregnancy: Secondary | ICD-10-CM

## 2021-11-26 DIAGNOSIS — O09212 Supervision of pregnancy with history of pre-term labor, second trimester: Secondary | ICD-10-CM | POA: Diagnosis not present

## 2021-11-29 ENCOUNTER — Other Ambulatory Visit: Payer: Self-pay | Admitting: *Deleted

## 2021-11-29 DIAGNOSIS — Z362 Encounter for other antenatal screening follow-up: Secondary | ICD-10-CM

## 2021-11-29 DIAGNOSIS — O09899 Supervision of other high risk pregnancies, unspecified trimester: Secondary | ICD-10-CM

## 2022-02-04 ENCOUNTER — Ambulatory Visit: Payer: Self-pay

## 2022-02-04 ENCOUNTER — Ambulatory Visit: Payer: BC Managed Care – PPO

## 2022-02-09 ENCOUNTER — Ambulatory Visit: Payer: BC Managed Care – PPO | Admitting: *Deleted

## 2022-02-09 ENCOUNTER — Ambulatory Visit: Payer: BC Managed Care – PPO | Attending: Obstetrics and Gynecology

## 2022-02-09 VITALS — BP 118/65 | HR 97

## 2022-02-09 DIAGNOSIS — O09899 Supervision of other high risk pregnancies, unspecified trimester: Secondary | ICD-10-CM | POA: Insufficient documentation

## 2022-02-09 DIAGNOSIS — O0992 Supervision of high risk pregnancy, unspecified, second trimester: Secondary | ICD-10-CM

## 2022-02-09 DIAGNOSIS — O09523 Supervision of elderly multigravida, third trimester: Secondary | ICD-10-CM | POA: Insufficient documentation

## 2022-02-09 DIAGNOSIS — E669 Obesity, unspecified: Secondary | ICD-10-CM

## 2022-02-09 DIAGNOSIS — O09213 Supervision of pregnancy with history of pre-term labor, third trimester: Secondary | ICD-10-CM

## 2022-02-09 DIAGNOSIS — Z3A33 33 weeks gestation of pregnancy: Secondary | ICD-10-CM

## 2022-02-09 DIAGNOSIS — O99213 Obesity complicating pregnancy, third trimester: Secondary | ICD-10-CM

## 2022-02-09 DIAGNOSIS — O24410 Gestational diabetes mellitus in pregnancy, diet controlled: Secondary | ICD-10-CM

## 2022-02-09 DIAGNOSIS — Z362 Encounter for other antenatal screening follow-up: Secondary | ICD-10-CM | POA: Insufficient documentation

## 2022-02-10 ENCOUNTER — Other Ambulatory Visit: Payer: Self-pay | Admitting: *Deleted

## 2022-02-10 DIAGNOSIS — O24410 Gestational diabetes mellitus in pregnancy, diet controlled: Secondary | ICD-10-CM

## 2022-02-25 LAB — OB RESULTS CONSOLE GBS: GBS: NEGATIVE

## 2022-03-09 ENCOUNTER — Ambulatory Visit: Payer: 59 | Admitting: *Deleted

## 2022-03-09 ENCOUNTER — Ambulatory Visit: Payer: 59 | Attending: Obstetrics and Gynecology

## 2022-03-09 VITALS — BP 110/60 | HR 99

## 2022-03-09 DIAGNOSIS — O24410 Gestational diabetes mellitus in pregnancy, diet controlled: Secondary | ICD-10-CM | POA: Insufficient documentation

## 2022-03-09 DIAGNOSIS — O0993 Supervision of high risk pregnancy, unspecified, third trimester: Secondary | ICD-10-CM | POA: Insufficient documentation

## 2022-03-09 DIAGNOSIS — O09523 Supervision of elderly multigravida, third trimester: Secondary | ICD-10-CM

## 2022-03-09 DIAGNOSIS — O09213 Supervision of pregnancy with history of pre-term labor, third trimester: Secondary | ICD-10-CM | POA: Diagnosis not present

## 2022-03-09 DIAGNOSIS — E669 Obesity, unspecified: Secondary | ICD-10-CM

## 2022-03-09 DIAGNOSIS — O99213 Obesity complicating pregnancy, third trimester: Secondary | ICD-10-CM

## 2022-03-09 DIAGNOSIS — Z3A37 37 weeks gestation of pregnancy: Secondary | ICD-10-CM

## 2022-03-16 ENCOUNTER — Other Ambulatory Visit: Payer: Self-pay | Admitting: Obstetrics and Gynecology

## 2022-03-16 ENCOUNTER — Telehealth (HOSPITAL_COMMUNITY): Payer: Self-pay | Admitting: *Deleted

## 2022-03-17 ENCOUNTER — Encounter (HOSPITAL_COMMUNITY): Payer: Self-pay

## 2022-03-17 ENCOUNTER — Encounter (HOSPITAL_COMMUNITY): Payer: Self-pay | Admitting: *Deleted

## 2022-03-17 ENCOUNTER — Telehealth (HOSPITAL_COMMUNITY): Payer: Self-pay | Admitting: *Deleted

## 2022-03-17 NOTE — Telephone Encounter (Signed)
Preadmission screen  

## 2022-03-23 ENCOUNTER — Inpatient Hospital Stay (HOSPITAL_COMMUNITY): Payer: 59

## 2022-03-23 ENCOUNTER — Other Ambulatory Visit: Payer: Self-pay

## 2022-03-23 ENCOUNTER — Inpatient Hospital Stay (HOSPITAL_COMMUNITY)
Admission: RE | Admit: 2022-03-23 | Discharge: 2022-03-25 | DRG: 807 | Disposition: A | Discharge status: Home / Self Care | Payer: 59 | Attending: Obstetrics and Gynecology | Admitting: Obstetrics and Gynecology

## 2022-03-23 ENCOUNTER — Encounter (HOSPITAL_COMMUNITY): Payer: Self-pay | Admitting: Obstetrics and Gynecology

## 2022-03-23 DIAGNOSIS — O09523 Supervision of elderly multigravida, third trimester: Secondary | ICD-10-CM

## 2022-03-23 DIAGNOSIS — O0993 Supervision of high risk pregnancy, unspecified, third trimester: Principal | ICD-10-CM

## 2022-03-23 DIAGNOSIS — O24420 Gestational diabetes mellitus in childbirth, diet controlled: Secondary | ICD-10-CM | POA: Diagnosis present

## 2022-03-23 DIAGNOSIS — Z3A39 39 weeks gestation of pregnancy: Secondary | ICD-10-CM | POA: Diagnosis not present

## 2022-03-23 DIAGNOSIS — O99214 Obesity complicating childbirth: Secondary | ICD-10-CM | POA: Diagnosis present

## 2022-03-23 HISTORY — DX: Type 2 diabetes mellitus without complications: E11.9

## 2022-03-23 LAB — GLUCOSE, CAPILLARY
Glucose-Capillary: 70 mg/dL (ref 70–99)
Glucose-Capillary: 105 mg/dL — ABNORMAL HIGH (ref 70–99)
Glucose-Capillary: 91 mg/dL (ref 70–99)

## 2022-03-23 LAB — CBC
HCT: 36.2 % (ref 36.0–46.0)
Hemoglobin: 11.5 g/dL — ABNORMAL LOW (ref 12.0–15.0)
MCH: 26 pg (ref 26.0–34.0)
MCHC: 31.8 g/dL (ref 30.0–36.0)
MCV: 81.7 fL (ref 80.0–100.0)
Platelets: 244 10*3/uL (ref 150–400)
RBC: 4.43 MIL/uL (ref 3.87–5.11)
RDW: 14.5 % (ref 11.5–15.5)
WBC: 10.6 10*3/uL — ABNORMAL HIGH (ref 4.0–10.5)
nRBC: 0 % (ref 0.0–0.2)

## 2022-03-23 LAB — TYPE AND SCREEN
ABO/RH(D): O POS
Antibody Screen: NEGATIVE

## 2022-03-23 MED ORDER — OXYCODONE-ACETAMINOPHEN 5-325 MG PO TABS: 1.0000 | ORAL_TABLET | ORAL | Status: DC | PRN

## 2022-03-23 MED ORDER — OXYTOCIN-SODIUM CHLORIDE 30-0.9 UT/500ML-% IV SOLN
1.0000 m[IU]/min | INTRAVENOUS | Status: DC
  Administered 2022-03-23: 1 m[IU]/min via INTRAVENOUS

## 2022-03-23 MED ORDER — FLEET ENEMA 7-19 GM/118ML RE ENEM: 1.0000 | ENEMA | RECTAL | Status: DC | PRN

## 2022-03-23 MED ORDER — LACTATED RINGERS IV SOLN
500.0000 mL | INTRAVENOUS | Status: DC | PRN
  Administered 2022-03-24: 500 mL via INTRAVENOUS

## 2022-03-23 MED ORDER — FENTANYL CITRATE (PF) 100 MCG/2ML IJ SOLN
100.0000 ug | INTRAMUSCULAR | Status: DC | PRN
  Administered 2022-03-23 – 2022-03-24 (×2): 100 ug via INTRAVENOUS
  Filled 2022-03-23 (×3): qty 2

## 2022-03-23 MED ORDER — MISOPROSTOL 25 MCG QUARTER TABLET
25.0000 ug | ORAL_TABLET | ORAL | Status: DC
  Administered 2022-03-23 (×2): 25 ug via VAGINAL
  Filled 2022-03-23 (×2): qty 1

## 2022-03-23 MED ORDER — OXYCODONE-ACETAMINOPHEN 5-325 MG PO TABS: 2.0000 | ORAL_TABLET | ORAL | Status: DC | PRN

## 2022-03-23 MED ORDER — ONDANSETRON HCL 4 MG/2ML IJ SOLN: 4.0000 mg | Freq: Four times a day (QID) | INTRAMUSCULAR | Status: DC | PRN

## 2022-03-23 MED ORDER — TERBUTALINE SULFATE 1 MG/ML IJ SOLN: 0.2500 mg | Freq: Once | INTRAMUSCULAR | Status: DC | PRN

## 2022-03-23 MED ORDER — OXYTOCIN-SODIUM CHLORIDE 30-0.9 UT/500ML-% IV SOLN
2.5000 [IU]/h | INTRAVENOUS | Status: DC
  Administered 2022-03-24: 2.5 [IU]/h via INTRAVENOUS
  Filled 2022-03-23: qty 500

## 2022-03-23 MED ORDER — LACTATED RINGERS IV SOLN
500.0000 mL | Freq: Once | INTRAVENOUS | Status: AC
  Administered 2022-03-24: 500 mL via INTRAVENOUS

## 2022-03-23 MED ORDER — EPHEDRINE 5 MG/ML INJ: 10.0000 mg | INTRAVENOUS | Status: DC | PRN

## 2022-03-23 MED ORDER — SOD CITRATE-CITRIC ACID 500-334 MG/5ML PO SOLN: 30.0000 mL | ORAL | Status: DC | PRN

## 2022-03-23 MED ORDER — OXYTOCIN-SODIUM CHLORIDE 30-0.9 UT/500ML-% IV SOLN: 1.0000 m[IU]/min | INTRAVENOUS | Status: DC

## 2022-03-23 MED ORDER — FENTANYL-BUPIVACAINE-NACL 0.5-0.125-0.9 MG/250ML-% EP SOLN
12.0000 mL/h | EPIDURAL | Status: DC | PRN
  Administered 2022-03-24: 12 mL/h via EPIDURAL
  Filled 2022-03-23: qty 250

## 2022-03-23 MED ORDER — OXYTOCIN BOLUS FROM INFUSION
333.0000 mL | Freq: Once | INTRAVENOUS | Status: AC
  Administered 2022-03-24: 333 mL via INTRAVENOUS

## 2022-03-23 MED ORDER — LACTATED RINGERS IV SOLN: INTRAVENOUS | Status: DC

## 2022-03-23 MED ORDER — DIPHENHYDRAMINE HCL 50 MG/ML IJ SOLN: 12.5000 mg | INTRAMUSCULAR | Status: DC | PRN

## 2022-03-23 MED ORDER — ACETAMINOPHEN 325 MG PO TABS: 650.0000 mg | ORAL_TABLET | ORAL | Status: DC | PRN

## 2022-03-23 MED ORDER — PHENYLEPHRINE 80 MCG/ML (10ML) SYRINGE FOR IV PUSH (FOR BLOOD PRESSURE SUPPORT): 80.0000 ug | PREFILLED_SYRINGE | INTRAVENOUS | Status: DC | PRN

## 2022-03-23 MED ORDER — LIDOCAINE HCL (PF) 1 % IJ SOLN: 30.0000 mL | INTRAMUSCULAR | Status: DC | PRN

## 2022-03-23 NOTE — H&P (Signed)
Sherri Castaneda is a 36 y.o. [phone] female presenting for IOL for GDM diet controlled at 39 weeks. Pt has   sugars have been well controlled.  EFW 11/8 6-14.  She has no complaints OB History     Gravida  5   Para  4   Term  2   Preterm  2   AB      Living  4      SAB      IAB      Ectopic      Multiple      Live Births  4          Past Medical History:  Diagnosis Date   Gestational Diabetes    Prediabetes    Preterm labor    Delivery at 32 weeks and 34 weeks.   Seasonal allergies    Vaginal Pap smear, abnormal    Past Surgical History:  Procedure Laterality Date   BILATERAL KNEE ARTHROSCOPY     GYNECOLOGIC CRYOSURGERY  2006   Family History: family history includes Cancer in her father, maternal grandmother, and mother; Hypertension in her maternal uncle. Social History:  reports that she has never smoked. She has never used smokeless tobacco. She reports that she does not drink alcohol and does not use drugs. Pregnancy complicated By  AMA Cryo GDM  H/O PTD stopped vaginal progesterone at 36 weeks    Maternal Diabetes: Yes:  Diabetes Type:  Diet controlled Genetic Screening: Declined Maternal Ultrasounds/Referrals: Normal Fetal Ultrasounds or other Referrals:  None Maternal Substance Abuse:  No Significant Maternal Medications:  None Significant Maternal Lab Results:  Group B Strep negative Number of Prenatal Visits:greater than 3 verified prenatal visits Other Comments:      Review of Systems History Dilation: Closed Effacement (%): Thick Station: Ballotable Exam by:: Dhriti Fales, MD Blood pressure 112/68, pulse 96, temperature 98.5 F (36.9 C), temperature source Oral, resp. rate 17, height 5' (1.524 m), weight 84.2 kg, last menstrual period 06/20/2021. Exam Physical Exam  Physical Examination: General appearance - alert, well appearing, and in no distress Chest - clear to auscultation, no wheezes, rales or rhonchi, symmetric air entry Heart -  normal rate and regular rhythm Abdomen - soft, nontender, nondistended, no masses or organomegaly gravid Extremities - peripheral pulses normal, no pedal edema, no clubbing or cyanosis, Homan's sign negative bilaterally  Prenatal labs: ABO, Rh: --/--/O POS (11/22 1448) Antibody: NEG (11/22 1448) Rubella: 6.18 (05/23 1538) RPR: Non Reactive (05/23 1538)  HBsAg: Negative (05/23 1538)  HIV: Non Reactive (05/23 1538)  GBS: Negative/-- (10/27 0000)   Assessment/Plan: GDMa1 at 39 weeks for IOL.  Check BS q 4 hours Ada diet until in active labor Cat 1 GBS neg  Cytotec for cervical ripening Pitocin when able Anticipate SVD   Esker Dever A Cleatis Fandrich 03/23/2022, 6:30 PM

## 2022-03-24 ENCOUNTER — Inpatient Hospital Stay (HOSPITAL_COMMUNITY): Payer: 59 | Admitting: Anesthesiology

## 2022-03-24 ENCOUNTER — Encounter (HOSPITAL_COMMUNITY): Payer: Self-pay | Admitting: Obstetrics and Gynecology

## 2022-03-24 LAB — RPR: RPR Ser Ql: NONREACTIVE

## 2022-03-24 LAB — GLUCOSE, CAPILLARY
Glucose-Capillary: 93 mg/dL (ref 70–99)
Glucose-Capillary: 73 mg/dL (ref 70–99)
Glucose-Capillary: 74 mg/dL (ref 70–99)

## 2022-03-24 MED ORDER — DIBUCAINE (PERIANAL) 1 % EX OINT: 1.0000 | TOPICAL_OINTMENT | CUTANEOUS | Status: DC | PRN

## 2022-03-24 MED ORDER — TERBUTALINE SULFATE 1 MG/ML IJ SOLN: 0.2500 mg | Freq: Once | INTRAMUSCULAR | Status: DC | PRN

## 2022-03-24 MED ORDER — FENTANYL-BUPIVACAINE-NACL 0.5-0.125-0.9 MG/250ML-% EP SOLN: 12.0000 mL/h | EPIDURAL | Status: DC | PRN

## 2022-03-24 MED ORDER — ACETAMINOPHEN 325 MG PO TABS: 650.0000 mg | ORAL_TABLET | ORAL | Status: DC | PRN

## 2022-03-24 MED ORDER — IBUPROFEN 600 MG PO TABS
600.0000 mg | ORAL_TABLET | Freq: Four times a day (QID) | ORAL | Status: DC
  Administered 2022-03-24 – 2022-03-25 (×4): 600 mg via ORAL
  Filled 2022-03-24 (×4): qty 1

## 2022-03-24 MED ORDER — PRENATAL MULTIVITAMIN CH
1.0000 | ORAL_TABLET | Freq: Every day | ORAL | Status: DC
  Administered 2022-03-25: 1 via ORAL
  Filled 2022-03-24: qty 1

## 2022-03-24 MED ORDER — ZOLPIDEM TARTRATE 5 MG PO TABS: 5.0000 mg | ORAL_TABLET | Freq: Every evening | ORAL | Status: DC | PRN

## 2022-03-24 MED ORDER — FENTANYL CITRATE (PF) 100 MCG/2ML IJ SOLN
INTRAMUSCULAR | Status: DC | PRN
  Administered 2022-03-24: 100 ug via EPIDURAL

## 2022-03-24 MED ORDER — WITCH HAZEL-GLYCERIN EX PADS: 1.0000 | MEDICATED_PAD | CUTANEOUS | Status: DC | PRN

## 2022-03-24 MED ORDER — BENZOCAINE-MENTHOL 20-0.5 % EX AERO: 1.0000 | INHALATION_SPRAY | CUTANEOUS | Status: DC | PRN

## 2022-03-24 MED ORDER — BUPIVACAINE HCL (PF) 0.25 % IJ SOLN
INTRAMUSCULAR | Status: DC | PRN
  Administered 2022-03-24: 8 mL via EPIDURAL

## 2022-03-24 MED ORDER — SENNOSIDES-DOCUSATE SODIUM 8.6-50 MG PO TABS
2.0000 | ORAL_TABLET | Freq: Every day | ORAL | Status: DC
  Administered 2022-03-25: 2 via ORAL
  Filled 2022-03-24: qty 2

## 2022-03-24 MED ORDER — ONDANSETRON HCL 4 MG/2ML IJ SOLN: 4.0000 mg | INTRAMUSCULAR | Status: DC | PRN

## 2022-03-24 MED ORDER — OXYTOCIN-SODIUM CHLORIDE 30-0.9 UT/500ML-% IV SOLN: 1.0000 m[IU]/min | INTRAVENOUS | Status: DC

## 2022-03-24 MED ORDER — ONDANSETRON HCL 4 MG PO TABS: 4.0000 mg | ORAL_TABLET | ORAL | Status: DC | PRN

## 2022-03-24 MED ORDER — TETANUS-DIPHTH-ACELL PERTUSSIS 5-2.5-18.5 LF-MCG/0.5 IM SUSY: 0.5000 mL | PREFILLED_SYRINGE | Freq: Once | INTRAMUSCULAR | Status: DC

## 2022-03-24 MED ORDER — COCONUT OIL OIL: 1.0000 | TOPICAL_OIL | Status: DC | PRN

## 2022-03-24 MED ORDER — LIDOCAINE HCL (PF) 1 % IJ SOLN
INTRAMUSCULAR | Status: DC | PRN
  Administered 2022-03-24: 8 mL via EPIDURAL

## 2022-03-24 MED ORDER — SIMETHICONE 80 MG PO CHEW: 80.0000 mg | CHEWABLE_TABLET | ORAL | Status: DC | PRN

## 2022-03-24 MED ORDER — DIPHENHYDRAMINE HCL 50 MG/ML IJ SOLN: 12.5000 mg | INTRAMUSCULAR | Status: DC | PRN

## 2022-03-24 MED ORDER — DIPHENHYDRAMINE HCL 25 MG PO CAPS: 25.0000 mg | ORAL_CAPSULE | Freq: Four times a day (QID) | ORAL | Status: DC | PRN

## 2022-03-24 MED ORDER — OXYTOCIN-SODIUM CHLORIDE 30-0.9 UT/500ML-% IV SOLN
1.0000 m[IU]/min | INTRAVENOUS | Status: DC
  Administered 2022-03-24: 8 m[IU]/min via INTRAVENOUS

## 2022-03-24 NOTE — Anesthesia Procedure Notes (Signed)
Epidural Patient location during procedure: OB Start time: 03/24/2022 6:54 AM End time: 03/24/2022 6:57 AM  Staffing Anesthesiologist: Janeece Riggers, MD  Preanesthetic Checklist Completed: patient identified, IV checked, site marked, risks and benefits discussed, surgical consent, monitors and equipment checked, pre-op evaluation and timeout performed  Epidural Patient position: sitting Prep: DuraPrep and site prepped and draped Patient monitoring: continuous pulse ox and blood pressure Approach: midline Location: L3-L4 Injection technique: LOR air  Needle:  Needle type: Tuohy  Needle gauge: 17 G Needle length: 9 cm and 9 Needle insertion depth: 6 cm Catheter type: closed end flexible Catheter size: 19 Gauge Catheter at skin depth: 13 cm Test dose: negative  Assessment Events: blood not aspirated, injection not painful, no injection resistance, no paresthesia and negative IV test

## 2022-03-24 NOTE — Anesthesia Preprocedure Evaluation (Signed)
Anesthesia Evaluation  Patient identified by MRN, date of birth, ID band Patient awake    Reviewed: Allergy & Precautions, H&P , Patient's Chart, lab work & pertinent test results  Airway Mallampati: II  TM Distance: >3 FB Neck ROM: full    Dental no notable dental hx. (+) Teeth Intact, Dental Advisory Given   Pulmonary neg pulmonary ROS   Pulmonary exam normal breath sounds clear to auscultation       Cardiovascular negative cardio ROS  Rhythm:regular Rate:Normal     Neuro/Psych negative neurological ROS  negative psych ROS   GI/Hepatic negative GI ROS, Neg liver ROS,,,  Endo/Other  diabetes  Obesity  Renal/GU negative Renal ROS  negative genitourinary   Musculoskeletal   Abdominal Normal abdominal exam  (+)   Peds  Hematology negative hematology ROS (+)   Anesthesia Other Findings   Reproductive/Obstetrics (+) Pregnancy                             Anesthesia Physical Anesthesia Plan  ASA: 2  Anesthesia Plan: Epidural   Post-op Pain Management: Minimal or no pain anticipated   Induction:   PONV Risk Score and Plan: 2 and Treatment may vary due to age or medical condition  Airway Management Planned: Natural Airway  Additional Equipment: None  Intra-op Plan:   Post-operative Plan:   Informed Consent: I have reviewed the patients History and Physical, chart, labs and discussed the procedure including the risks, benefits and alternatives for the proposed anesthesia with the patient or authorized representative who has indicated his/her understanding and acceptance.       Plan Discussed with: Anesthesiologist  Anesthesia Plan Comments:         Anesthesia Quick Evaluation

## 2022-03-24 NOTE — Progress Notes (Signed)
  Subjective: Pateint reports contractions are more intense.   Objective: BP 118/72   Pulse 83   Temp 98 F (36.7 C) (Oral)   Resp 17   Ht 5' (1.524 m)   Wt 84.2 kg   LMP 06/20/2021   BMI 36.25 kg/m  No intake/output data recorded. No intake/output data recorded.  FHT:  FHR: 130 bpm, variability: moderate,  accelerations:  Present,  decelerations:  Absent UC:   regular, every 1-2 minutes SVE:   Dilation: 1.5 Effacement (%): 30 Station: -2 Exam by:: Dr. Landry Mellow  Labs: Lab Results  Component Value Date   WBC 10.6 (H) 03/23/2022   HGB 11.5 (L) 03/23/2022   HCT 36.2 03/23/2022   MCV 81.7 03/23/2022   PLT 244 03/23/2022    Assessment / Plan: Induction of labor due to GDM diet controlled at 39 weeks and 4 days   Labor:  pt is contracting too frequently for repeat cytotec. Will start pitocin 1X1 and hold at 6 Preeclampsia:   NA Fetal Wellbeing:  Category I Pain Control:  IV pain meds I/D:  n/a Anticipated MOD:  NSVD  Christophe Louis, MD 03/24/2022, 12:27 AM

## 2022-03-24 NOTE — Progress Notes (Signed)
Sherri Castaneda is a 36 y.o. [id number] at 20w4dadmitted for induction of labor due to Gestational diabetes.  Subjective: Patient doing well resting comfortable, epidural in place  Objective: BP 103/66   Pulse 81   Temp 98.2 F (36.8 C) (Oral)   Resp 18   Ht 5' (1.524 m)   Wt 84.2 kg   LMP 06/20/2021   SpO2 100%   BMI 36.25 kg/m  No intake/output data recorded. No intake/output data recorded.  FHT:  FHR: 130 bpm, variability: moderate,  accelerations:  Present,  decelerations:  Present early UC:   regular, every 3-4 minutes SVE:   Dilation: 4 Effacement (%): 60 Station: -2 Exam by:: kRaliegh Ipjones RN  Labs: Lab Results  Component Value Date   WBC 10.6 (H) 03/23/2022   HGB 11.5 (L) 03/23/2022   HCT 36.2 03/23/2022   MCV 81.7 03/23/2022   PLT 244 03/23/2022    Assessment / Plan: Induction of labor due to gestational diabetes,  progressing well on pitocin  Labor: Progressing normally Preeclampsia:   n/a Fetal Wellbeing:  Category I Pain Control:  Epidural I/D:  n/a Anticipated MOD:  NSVD  EJosefine Class MD 03/24/2022, 10:22 AM

## 2022-03-24 NOTE — Lactation Note (Signed)
This note was copied from a baby's chart. Lactation Consultation Note  Patient Name: Sherri Castaneda ORVIF'B Date: 03/24/2022 Reason for consult: Initial assessment;1st time breastfeeding;Term;Maternal endocrine disorder Age:37 hours Mom stated the baby has latched well to the Rt. Breast but has been unable to latch to the Lt. Breast. \ LC got baby to latch to the Lt. Breast w/o difficulty.  Baby was sleepy not wanting to open his eyes even w/stimulation. LC placed baby at the breast and stoked lips w/mom's nipple, baby opened and latched great and started BF well. Had painful latch, chin tug and no further pain noted. Mom didn't BF her other children d/t NICU. Mom didn't pump any and got very engorged. Mom does want to BF this baby. She is glad he is latching well. Newborn feeding habits, STS, I&O, positioning, body alignment, support reviewed. Answered questions. Encouraged to call for assistance as needed. Praised mom for BF so well.  Maternal Data Has patient been taught Hand Expression?: Yes Does the patient have breastfeeding experience prior to this delivery?: No  Feeding Mother's Current Feeding Choice: Breast Milk and Formula  LATCH Score Latch: Grasps breast easily, tongue down, lips flanged, rhythmical sucking.  Audible Swallowing: None  Type of Nipple: Everted at rest and after stimulation  Comfort (Breast/Nipple): Soft / non-tender  Hold (Positioning): Assistance needed to correctly position infant at breast and maintain latch.  LATCH Score: 7   Lactation Tools Discussed/Used    Interventions Interventions: Breast feeding basics reviewed;Adjust position;Assisted with latch;Support pillows;Skin to skin;Position options;Breast massage;Hand express;LC Services brochure;Breast compression  Discharge    Consult Status Consult Status: Follow-up Date: 03/25/22 Follow-up type: In-patient    Theodoro Kalata 03/24/2022, 9:17 PM

## 2022-03-25 LAB — CBC
HCT: 31.7 % — ABNORMAL LOW (ref 36.0–46.0)
Hemoglobin: 10.1 g/dL — ABNORMAL LOW (ref 12.0–15.0)
MCH: 26.2 pg (ref 26.0–34.0)
MCHC: 31.9 g/dL (ref 30.0–36.0)
MCV: 82.3 fL (ref 80.0–100.0)
Platelets: 179 10*3/uL (ref 150–400)
RBC: 3.85 MIL/uL — ABNORMAL LOW (ref 3.87–5.11)
RDW: 14.5 % (ref 11.5–15.5)
WBC: 14.3 10*3/uL — ABNORMAL HIGH (ref 4.0–10.5)
nRBC: 0 % (ref 0.0–0.2)

## 2022-03-25 LAB — GLUCOSE, CAPILLARY: Glucose-Capillary: 101 mg/dL — ABNORMAL HIGH (ref 70–99)

## 2022-03-25 MED ORDER — IBUPROFEN 600 MG PO TABS: 600.0000 mg | ORAL_TABLET | Freq: Four times a day (QID) | ORAL | 0 refills | Status: AC | PRN

## 2022-03-25 MED ORDER — DOCUSATE SODIUM 100 MG PO CAPS: 100.0000 mg | ORAL_CAPSULE | Freq: Two times a day (BID) | ORAL | 0 refills | Status: AC

## 2022-03-25 NOTE — Progress Notes (Signed)
Patient was unaware to fast for blood sugar this morning at 0500. Dr. Ivin Poot aware. Verbal order given for fasting CBG tomorrow morning at 0500.

## 2022-03-25 NOTE — Anesthesia Postprocedure Evaluation (Signed)
Anesthesia Post Note  Patient: Sherri Castaneda  Procedure(s) Performed: AN AD HOC LABOR EPIDURAL     Patient location during evaluation: Mother Baby Anesthesia Type: Epidural Level of consciousness: awake Pain management: satisfactory to patient Vital Signs Assessment: post-procedure vital signs reviewed and stable Respiratory status: spontaneous breathing Cardiovascular status: stable Anesthetic complications: no   No notable events documented.  Last Vitals:  Vitals:   03/25/22 0018 03/25/22 0454  BP: 108/70 112/66  Pulse:  76  Resp: 20 18  Temp: 37.2 C 37.1 C  SpO2: 97% 97%    Last Pain:  Vitals:   03/25/22 0715  TempSrc:   PainSc: 0-No pain   Pain Goal:                   [employer]

## 2022-03-25 NOTE — Progress Notes (Signed)
Post Partum Day 1 Subjective: no complaints, up ad lib, voiding, tolerating PO, and + flatus  Objective: Blood pressure 112/66, pulse 76, temperature 98.7 F (37.1 C), temperature source Oral, resp. rate 18, height 5' (1.524 m), weight 84.2 kg, last menstrual period 06/20/2021, SpO2 97 %, unknown if currently breastfeeding.  Physical Exam:  General: alert, cooperative, and no distress Lochia: appropriate Uterine Fundus: firm Incision: n/a DVT Evaluation: No evidence of DVT seen on physical exam.  Recent Labs    03/23/22 1449 03/25/22 0445  HGB 11.5* 10.1*  HCT 36.2 31.7*    Assessment/Plan: Discharge home, Breastfeeding, Lactation consult, and Circumcision prior to discharge RTO in 6 weeks for routine postpartum visit Pelvic rest x 6 weeks Partner plans to have vasectomy for contraception   LOS: 2 days   Josefine Class, MD 03/25/2022, 8:22 AM

## 2022-03-25 NOTE — Discharge Summary (Signed)
Obstetric Discharge Summary Reason for Admission: induction of labor Prenatal Procedures: none Intrapartum Procedures: spontaneous vaginal delivery Postpartum Procedures: none Complications-Operative and Postpartum: none Hemoglobin  Date Value Ref Range Status  03/25/2022 10.1 (L) 12.0 - 15.0 g/dL Final  09/21/2021 11.4 11.1 - 15.9 g/dL Final   HCT  Date Value Ref Range Status  03/25/2022 31.7 (L) 36.0 - 46.0 % Final   Hematocrit  Date Value Ref Range Status  09/21/2021 34.3 34.0 - 46.6 % Final    Physical Exam:  General: alert, cooperative, and no distress Lochia: appropriate Uterine Fundus: firm Incision: n/a DVT Evaluation: No evidence of DVT seen on physical exam.  Discharge Diagnoses: Term Pregnancy-delivered  Discharge Information: Date: 03/25/2022 Activity: pelvic rest Diet: routine Medications: Ibuprofen and Colace Condition: stable Instructions: refer to practice specific booklet Discharge to: home  Follow-up Bryan. Schedule an appointment as soon as possible for a visit in 6 week(s).   Contact information: [address] [place] [address] [phone]                Newborn Data: Live born female  Birth Weight: 6 lb 15.1 oz (3150 g) APGAR: 7, 9  Newborn Delivery   Birth date/time: 03/24/2022 14:58:00 Delivery type: Vaginal, Spontaneous      Home with mother.  Josefine Class 03/25/2022, 3:35 PM

## 2022-03-25 NOTE — Lactation Note (Signed)
This note was copied from a baby's chart. Lactation Consultation Note  Patient Name: Sherri Castaneda SHFWY'O Date: 03/25/2022 Reason for consult: Follow-up assessment;1st time breastfeeding;Term Age:37 hours  LC in to room, parents are expecting discharge. Infant is sleeping upon arrival. Birthing parent is breast and formula feeding. Birthing parent reports a good deep latch and denies pain or discomfort. Birthing parent is concerned about her milk supply, specially left breast. LC reviewed hand expression and provided a manual pump. Unable to see colostrum with either. Parents are pace bottle-feeding and have volume guidelines for formula feeding. Discussed normal behavior and patterns after 24h, voids and stools as signs good intake, pumping, clusterfeeding, skin to skin. Talked about milk coming into volume and managing engorgement.   Plan: 1-Feeding on demand or 8-12 times in 24h period. 2-Hand express/pump as needed for stimulation and supplementation 3-Encouraged birthing parent rest, hydration and food intake.   Contact LC as needed for feeds/support/concerns/questions. All questions answered at this time. Reviewed [facility] brochure and other resources available.     Maternal Data Has patient been taught Hand Expression?: Yes Does the patient have breastfeeding experience prior to this delivery?: No  Feeding Mother's Current Feeding Choice: Breast Milk and Formula Nipple Type: Slow - flow  Lactation Tools Discussed/Used Tools: Pump;Flanges Flange Size: 27 Breast pump type: Manual Pump Education: Milk Storage;Setup, frequency, and cleaning Reason for Pumping: stimulation and supplementation Pumping frequency: as needed  Interventions Interventions: Breast feeding basics reviewed;Skin to skin;Hand express;Breast massage;Expressed milk;Hand pump;Education;LC Services brochure  Discharge Discharge Education: Engorgement and breast care;Warning signs for feeding baby Pump:  Manual;Personal WIC Program: No  Consult Status Consult Status: Complete Date: 03/25/22 Follow-up type: Call as needed    [place] 03/25/2022, 10:26 AM

## 2022-03-31 ENCOUNTER — Telehealth (HOSPITAL_COMMUNITY): Payer: Self-pay

## 2022-03-31 NOTE — Telephone Encounter (Signed)
Patient did not answer phone call. Voicemail left for patient.   Sharyn Lull [facility] 03/31/2022,1800

## 2022-04-09 ENCOUNTER — Other Ambulatory Visit: Payer: Self-pay

## 2022-04-09 ENCOUNTER — Encounter (HOSPITAL_COMMUNITY): Payer: Self-pay | Admitting: Obstetrics & Gynecology

## 2022-04-09 ENCOUNTER — Inpatient Hospital Stay (HOSPITAL_COMMUNITY)
Admission: AD | Admit: 2022-04-09 | Discharge: 2022-04-12 | DRG: 776 | Disposition: A | Discharge status: Home / Self Care | Payer: 59 | Attending: Obstetrics & Gynecology | Admitting: Obstetrics & Gynecology

## 2022-04-09 DIAGNOSIS — O1415 Severe pre-eclampsia, complicating the puerperium: Principal | ICD-10-CM

## 2022-04-09 DIAGNOSIS — Z91040 Latex allergy status: Secondary | ICD-10-CM | POA: Diagnosis not present

## 2022-04-09 DIAGNOSIS — Z3A00 Weeks of gestation of pregnancy not specified: Secondary | ICD-10-CM

## 2022-04-09 DIAGNOSIS — R030 Elevated blood-pressure reading, without diagnosis of hypertension: Secondary | ICD-10-CM | POA: Diagnosis present

## 2022-04-09 DIAGNOSIS — O1410 Severe pre-eclampsia, unspecified trimester: Secondary | ICD-10-CM | POA: Diagnosis present

## 2022-04-09 LAB — CBC
HCT: 43.2 % (ref 36.0–46.0)
Hemoglobin: 13.3 g/dL (ref 12.0–15.0)
MCH: 25.4 pg — ABNORMAL LOW (ref 26.0–34.0)
MCHC: 30.8 g/dL (ref 30.0–36.0)
MCV: 82.6 fL (ref 80.0–100.0)
Platelets: 323 10*3/uL (ref 150–400)
RBC: 5.23 MIL/uL — ABNORMAL HIGH (ref 3.87–5.11)
RDW: 13.9 % (ref 11.5–15.5)
WBC: 5.8 10*3/uL (ref 4.0–10.5)
nRBC: 0 % (ref 0.0–0.2)

## 2022-04-09 LAB — COMPREHENSIVE METABOLIC PANEL
ALT: 20 U/L (ref 0–44)
AST: 22 U/L (ref 15–41)
Albumin: 3.4 g/dL — ABNORMAL LOW (ref 3.5–5.0)
Alkaline Phosphatase: 129 U/L — ABNORMAL HIGH (ref 38–126)
Anion gap: 9 (ref 5–15)
BUN: 7 mg/dL (ref 6–20)
CO2: 26 mmol/L (ref 22–32)
Calcium: 9.4 mg/dL (ref 8.9–10.3)
Chloride: 107 mmol/L (ref 98–111)
Creatinine, Ser: 0.67 mg/dL (ref 0.44–1.00)
GFR, Estimated: >60 mL/min (ref >60)
Glucose, Bld: 113 mg/dL — ABNORMAL HIGH (ref 70–99)
Potassium: 3.3 mmol/L — ABNORMAL LOW (ref 3.5–5.1)
Sodium: 142 mmol/L (ref 135–145)
Total Bilirubin: 0.3 mg/dL (ref 0.3–1.2)
Total Protein: 6.9 g/dL (ref 6.5–8.1)

## 2022-04-09 MED ORDER — HYDRALAZINE HCL 20 MG/ML IJ SOLN: 10.0000 mg | INTRAMUSCULAR | Status: DC | PRN

## 2022-04-09 MED ORDER — LABETALOL HCL 5 MG/ML IV SOLN: 20.0000 mg | INTRAVENOUS | Status: DC | PRN

## 2022-04-09 MED ORDER — LABETALOL HCL 5 MG/ML IV SOLN
40.0000 mg | INTRAVENOUS | Status: DC | PRN
  Administered 2022-04-09: 40 mg via INTRAVENOUS

## 2022-04-09 MED ORDER — MAGNESIUM SULFATE BOLUS VIA INFUSION
4.0000 g | Freq: Once | INTRAVENOUS | Status: AC
  Administered 2022-04-09: 4 g via INTRAVENOUS
  Filled 2022-04-09: qty 1000

## 2022-04-09 MED ORDER — LABETALOL HCL 5 MG/ML IV SOLN: 80.0000 mg | INTRAVENOUS | Status: DC | PRN

## 2022-04-09 MED ORDER — ONDANSETRON HCL 4 MG/2ML IJ SOLN
4.0000 mg | Freq: Once | INTRAMUSCULAR | Status: AC
  Administered 2022-04-09: 4 mg via INTRAVENOUS
  Filled 2022-04-09: qty 2

## 2022-04-09 MED ORDER — IBUPROFEN 600 MG PO TABS
600.0000 mg | ORAL_TABLET | Freq: Four times a day (QID) | ORAL | Status: DC | PRN
  Administered 2022-04-09: 600 mg via ORAL
  Filled 2022-04-09 (×2): qty 1

## 2022-04-09 MED ORDER — LACTATED RINGERS IV SOLN: INTRAVENOUS | Status: DC

## 2022-04-09 MED ORDER — MAGNESIUM SULFATE 40 GM/1000ML IV SOLN
2.0000 g/h | INTRAVENOUS | Status: AC
  Administered 2022-04-09 – 2022-04-10 (×3): 2 g/h via INTRAVENOUS
  Filled 2022-04-09 (×2): qty 1000

## 2022-04-09 MED ORDER — LABETALOL HCL 5 MG/ML IV SOLN
20.0000 mg | INTRAVENOUS | Status: DC | PRN
  Administered 2022-04-09: 20 mg via INTRAVENOUS
  Filled 2022-04-09: qty 4

## 2022-04-09 MED ORDER — LABETALOL HCL 5 MG/ML IV SOLN: 40.0000 mg | INTRAVENOUS | Status: DC | PRN

## 2022-04-09 MED ORDER — LABETALOL HCL 5 MG/ML IV SOLN
40.0000 mg | INTRAVENOUS | Status: DC | PRN
  Filled 2022-04-09: qty 8

## 2022-04-09 NOTE — MAU Note (Signed)
Sherri Castaneda is a 36 y.o. here in MAU reporting: s/p SVD on 11/23. Today when she woke up she had some blurry vision and a headache so she checked her BP. It was 177/100 and then 167/89 but pt reports she's not sure if she was using the machine correctly. States vision is still blurry but states her glasses rx is about 37 years old and thinks that may need to be updated. No headache currently.   Onset of complaint: today  Pain score: 0/10  Vitals:   04/09/22 1718  BP: (!) 175/92  Pulse: 68  Resp: 16  Temp: 98.2 F (36.8 C)  SpO2: 98%     FHT:NA  Lab orders placed from triage: none

## 2022-04-09 NOTE — H&P (Signed)
HPI MAU: 36 y.o. [id number] s/p SVD 2 weeks ago presenting with HA and elevated home BP. Reports onset of HA this afternoon. She decided to check her BP and it was 177/100. Endorses blurry vision today. Denies RUQ pain, SOB, and CP.   HPI: Pt required x 2 doses IV labetalol in in MAU, no PCR taken r/t would not change POD, pt being admitted for PreE with SF with SR BP and blurred vision with HA. HA resolved prior to admission. CBC, CMP unremarkable. Pt started on Mag 4gm bolus at 1825 on 12/9, continues with 2gm /hr.  Patient Active Problem List   Diagnosis Date Noted   Pre-eclampsia, severe, postpartum condition 04/09/2022   Preeclampsia, severe 04/09/2022   Labor and delivery, indication for care 03/23/2022   Prediabetes 09/22/2021   AMA (advanced maternal age) multigravida 35+ 09/21/2021   Supervision of high-risk pregnancy 09/08/2021   Previous two preterm deliveries followed by two term deliveries, antepartum 03/18/2013     Active Ambulatory Problems    Diagnosis Date Noted   Previous two preterm deliveries followed by two term deliveries, antepartum 03/18/2013   Supervision of high-risk pregnancy 09/08/2021   AMA (advanced maternal age) multigravida 35+ 09/21/2021   Prediabetes 09/22/2021   Labor and delivery, indication for care 03/23/2022   Resolved Ambulatory Problems    Diagnosis Date Noted   Normal delivery 03/28/2012   Supervision of other normal pregnancy 03/18/2013   Pregnancy related nausea and vomiting, antepartum 03/18/2013   GBS (group B Streptococcus carrier), +RV culture, currently pregnant 09/21/2013   Pregnancy related symphysis pain in third trimester, antepartum 09/25/2013   Active labor 10/09/2013   Normal delivery 10/09/2013   Past Medical History:  Diagnosis Date   Gestational Diabetes    Preterm labor    Seasonal allergies    Vaginal Pap smear, abnormal       Medications Prior to Admission  Medication Sig Dispense Refill Last Dose   ferrous  sulfate 325 (65 FE) MG tablet Take 1 tablet (325 mg total) by mouth every other day. 30 tablet 3 04/09/2022   pantoprazole (PROTONIX) 40 MG tablet Take 40 mg by mouth every evening.   04/09/2022   Prenatal Vit-Fe Fumarate-FA (PRENATAL MULTIVITAMIN) TABS tablet Take 1 tablet by mouth in the morning. (Patient not taking: Reported on 04/09/2022)   Not Taking    Past Medical History:  Diagnosis Date   Gestational Diabetes    Prediabetes    Preterm labor    Delivery at 32 weeks and 34 weeks.   Seasonal allergies    Vaginal Pap smear, abnormal      No current facility-administered medications on file prior to encounter.   Current Outpatient Medications on File Prior to Encounter  Medication Sig Dispense Refill   ferrous sulfate 325 (65 FE) MG tablet Take 1 tablet (325 mg total) by mouth every other day. 30 tablet 3   pantoprazole (PROTONIX) 40 MG tablet Take 40 mg by mouth every evening.     Prenatal Vit-Fe Fumarate-FA (PRENATAL MULTIVITAMIN) TABS tablet Take 1 tablet by mouth in the morning. (Patient not taking: Reported on 04/09/2022)       Allergies  Allergen Reactions   Flagyl [Metronidazole] Swelling   Pineapple Itching and Swelling   Latex Itching and Rash    OB History     Gravida  5   Para  5   Term  3   Preterm  2   AB      Living  5      SAB      IAB      Ectopic      Multiple  0   Live Births  5          Past Medical History:  Diagnosis Date   Gestational Diabetes    Prediabetes    Preterm labor    Delivery at 32 weeks and 34 weeks.   Seasonal allergies    Vaginal Pap smear, abnormal    Past Surgical History:  Procedure Laterality Date   BILATERAL KNEE ARTHROSCOPY     GYNECOLOGIC CRYOSURGERY  2006   Family History: family history includes Cancer in her father, maternal grandmother, and mother; Hypertension in her maternal uncle. Social History:  reports that she has never smoked. She has never used smokeless tobacco. She reports that she  does not drink alcohol and does not use drugs.   ROS:  Review of Systems  Constitutional: Negative.   HENT: Negative.    Eyes:  Positive for blurred vision.  Respiratory: Negative.    Cardiovascular: Negative.   Gastrointestinal: Negative.   Genitourinary: Negative.   Skin: Negative.   Neurological:  Positive for headaches.  Endo/Heme/Allergies: Negative.   Psychiatric/Behavioral: Negative.       Physical Exam: BP 139/88 (BP Location: Left Arm)   Pulse 74   Temp 98.6 F (37 C) (Oral)   Resp 20   Ht 5' (1.524 m)   Wt 80.9 kg   SpO2 99%   Breastfeeding No   BMI 34.84 kg/m   Physical Exam Vitals and nursing note reviewed.  Constitutional:      Appearance: Normal appearance.  HENT:     Head: Normocephalic and atraumatic.     Nose: Nose normal.     Mouth/Throat:     Pharynx: Oropharynx is clear.  Eyes:     Conjunctiva/sclera: Conjunctivae normal.  Cardiovascular:     Rate and Rhythm: Normal rate and regular rhythm.     Pulses: Normal pulses.     Heart sounds: Normal heart sounds.  Pulmonary:     Effort: Pulmonary effort is normal.     Breath sounds: Normal breath sounds.  Abdominal:     General: Bowel sounds are normal.  Musculoskeletal:        General: Normal range of motion.     Cervical back: Normal range of motion and neck supple.  Skin:    General: Skin is warm.     Capillary Refill: Capillary refill takes less than 2 seconds.     Comments: No clonus, +2 DTR  Neurological:     General: No focal deficit present.     Mental Status: She is alert.  Psychiatric:        Mood and Affect: Mood normal.      Labs: Results for orders placed or performed during the hospital encounter of 04/09/22 (from the past 24 hour(s))  Comprehensive metabolic panel     Status: Abnormal   Collection Time: 04/09/22  5:47 PM  Result Value Ref Range   Sodium 142 135 - 145 mmol/L   Potassium 3.3 (L) 3.5 - 5.1 mmol/L   Chloride 107 98 - 111 mmol/L   CO2 26 22 - 32 mmol/L    Glucose, Bld 113 (H) 70 - 99 mg/dL   BUN 7 6 - 20 mg/dL   Creatinine, Ser 0.67 0.44 - 1.00 mg/dL   Calcium 9.4 8.9 - 10.3 mg/dL   Total Protein 6.9 6.5 - 8.1 g/dL  Albumin 3.4 (L) 3.5 - 5.0 g/dL   AST 22 15 - 41 U/L   ALT 20 0 - 44 U/L   Alkaline Phosphatase 129 (H) 38 - 126 U/L   Total Bilirubin 0.3 0.3 - 1.2 mg/dL   GFR, Estimated >60 >60 mL/min   Anion gap 9 5 - 15  CBC     Status: Abnormal   Collection Time: 04/09/22  5:47 PM  Result Value Ref Range   WBC 5.8 4.0 - 10.5 K/uL   RBC 5.23 (H) 3.87 - 5.11 MIL/uL   Hemoglobin 13.3 12.0 - 15.0 g/dL   HCT 43.2 36.0 - 46.0 %   MCV 82.6 80.0 - 100.0 fL   MCH 25.4 (L) 26.0 - 34.0 pg   MCHC 30.8 30.0 - 36.0 g/dL   RDW 13.9 11.5 - 15.5 %   Platelets 323 150 - 400 K/uL   nRBC 0.0 0.0 - 0.2 %    Imaging:  No results found.  MAU Course: Orders Placed This Encounter  Procedures   Comprehensive metabolic panel   CBC   Comprehensive metabolic panel   CBC with Differential/Platelet   Diet regular Room service appropriate? Yes; Fluid consistency: Thin   Notify physician (specify) Confirmatory reading of BP> 160/110 15 minutes later   Apply Hypertensive Disorders of Pregnancy Care Plan   Notify physician (specify) Confirmatory reading of BP> 160/110 15 minutes later   Apply Hypertensive Disorders of Pregnancy Care Plan   Strict intake and output   Measure blood pressure   Notify physician (specify) Confirmatory reading of BP> 160/110 15 minutes later   Apply Hypertensive Disorders of Pregnancy Care Plan   Initiate Oral Care Protocol   Initiate Carrier Fluid Protocol   SCDs   Informed Consent Details: Physician/Practitioner Attestation; Transcribe to consent form and obtain patient signature   Measure blood pressure   Full code   Admit to Inpatient (patient's expected length of stay will be greater than 2 midnights or inpatient only procedure)   Admit to Inpatient (patient's expected length of stay will be greater than 2 midnights  or inpatient only procedure)   Meds ordered this encounter  Medications   DISCONTD: labetalol (NORMODYNE) injection 20 mg   DISCONTD: labetalol (NORMODYNE) injection 40 mg   DISCONTD: labetalol (NORMODYNE) injection 80 mg   DISCONTD: hydrALAZINE (APRESOLINE) injection 10 mg   ondansetron (ZOFRAN) injection 4 mg   magnesium bolus via infusion 4 g   magnesium sulfate 40 grams in SWI 1000 mL OB infusion   lactated ringers infusion   AND Linked Order Group    labetalol (NORMODYNE) injection 20 mg    labetalol (NORMODYNE) injection 40 mg    labetalol (NORMODYNE) injection 80 mg    hydrALAZINE (APRESOLINE) injection 10 mg   lactated ringers infusion   AND Linked Order Group    labetalol (NORMODYNE) injection 20 mg    labetalol (NORMODYNE) injection 40 mg    labetalol (NORMODYNE) injection 80 mg    hydrALAZINE (APRESOLINE) injection 10 mg   ibuprofen (ADVIL) tablet 600 mg    Assessment/Plan: HPI: Pt required x 2 doses IV labetalol in in MAU, no PCR taken r/t would not change POD, pt being admitted for PreE with SF with SR BP and blurred vision with HA. HA resolved prior to admission. CBC, CMP unremarkable. Pt started on Mag 4gm bolus at 1825 on 12/9, continues with 2gm /hr.   Plan: Admit to Antepartum per consult with DR Alesia Richards Routine [facility] orders Motrin '600mg'$  Q6H  PRN pain Regular diet SCD Repeat CBC and CMP in morning.  Labetalol protocol PRN.  Continue 2gm/hr for 24 hours on Mag, stop time 12/10'@1900'$  Anticipate discharge once 24 hours PP mag and BP stable on 12/11.   [place], FNP-C, PMHNP-BC  [address] # [place], [place] [address]  Cell: [phone]  Office Phone: [phone] Fax: [phone] 04/09/2022  8:16 PM

## 2022-04-09 NOTE — MAU Provider Note (Signed)
History     CSN: [id number]  Arrival date and time: 04/09/22 1656   Event Date/Time   First Provider Initiated Contact with Patient 04/09/22 1736      Chief Complaint  Patient presents with   Hypertension   36 y.o. [id number] s/p SVD 2 weeks ago presenting with HA and elevated home BP. Reports onset of HA this afternoon. She decided to check her BP and it was 177/100. Endorses blurry vision today. Denies RUQ pain, SOB, and CP.     OB History     Gravida  5   Para  5   Term  3   Preterm  2   AB      Living  5      SAB      IAB      Ectopic      Multiple  0   Live Births  5           Past Medical History:  Diagnosis Date   Gestational Diabetes    Prediabetes    Preterm labor    Delivery at 32 weeks and 34 weeks.   Seasonal allergies    Vaginal Pap smear, abnormal     Past Surgical History:  Procedure Laterality Date   BILATERAL KNEE ARTHROSCOPY     GYNECOLOGIC CRYOSURGERY  2006    Family History  Problem Relation Age of Onset   Cancer Mother    Cancer Father    Hypertension Maternal Uncle    Cancer Maternal Grandmother    Asthma Neg Hx    Birth defects Neg Hx    Diabetes Neg Hx    Heart disease Neg Hx     Social History   Tobacco Use   Smoking status: Never   Smokeless tobacco: Never  Vaping Use   Vaping Use: Never used  Substance Use Topics   Alcohol use: No   Drug use: No    Allergies:  Allergies  Allergen Reactions   Flagyl [Metronidazole] Swelling   Pineapple Itching and Swelling   Latex Itching and Rash    Medications Prior to Admission  Medication Sig Dispense Refill Last Dose   ferrous sulfate 325 (65 FE) MG tablet Take 1 tablet (325 mg total) by mouth every other day. 30 tablet 3 04/09/2022   pantoprazole (PROTONIX) 40 MG tablet Take 40 mg by mouth every evening.   04/09/2022   Prenatal Vit-Fe Fumarate-FA (PRENATAL MULTIVITAMIN) TABS tablet Take 1 tablet by mouth in the morning. (Patient not taking: Reported on  04/09/2022)   Not Taking    Review of Systems  Eyes:  Positive for visual disturbance.  Respiratory:  Negative for shortness of breath.   Gastrointestinal:  Positive for nausea and vomiting. Negative for abdominal pain.  Neurological:  Positive for headaches.   Physical Exam   Blood pressure 135/79, pulse 83, temperature 98 F (36.7 C), temperature source Oral, resp. rate 17, height 5' (1.524 m), weight 80.9 kg, SpO2 98 %, not currently breastfeeding. Patient Vitals for the past 24 hrs:  BP Temp Temp src Pulse Resp SpO2 Height Weight  04/09/22 1831 135/79 -- -- 83 17 98 % -- --  04/09/22 1825 (!) 145/78 98 F (36.7 C) Oral 72 14 98 % -- --  04/09/22 1816 138/78 -- -- 70 -- -- -- --  04/09/22 1810 136/84 -- -- 72 -- -- -- --  04/09/22 1750 (!) 148/110 -- -- 91 -- -- -- --  04/09/22 1726 (!) 191/99 -- --  70 -- -- -- --  04/09/22 1718 (!) 175/92 98.2 F (36.8 C) Oral 68 16 98 % -- --  04/09/22 1712 -- -- -- -- -- -- 5' (1.524 m) 80.9 kg   Physical Exam Vitals and nursing note reviewed.  Constitutional:      General: She is not in acute distress.    Appearance: Normal appearance.  HENT:     Head: Normocephalic and atraumatic.  Cardiovascular:     Rate and Rhythm: Normal rate.  Pulmonary:     Effort: Pulmonary effort is normal. No respiratory distress.  Musculoskeletal:        General: Normal range of motion.     Cervical back: Normal range of motion.  Neurological:     General: No focal deficit present.     Mental Status: She is alert and oriented to person, place, and time.  Psychiatric:        Mood and Affect: Mood normal.        Behavior: Behavior normal.    Results for orders placed or performed during the hospital encounter of 04/09/22 (from the past 24 hour(s))  CBC     Status: Abnormal   Collection Time: 04/09/22  5:47 PM  Result Value Ref Range   WBC 5.8 4.0 - 10.5 K/uL   RBC 5.23 (H) 3.87 - 5.11 MIL/uL   Hemoglobin 13.3 12.0 - 15.0 g/dL   HCT 43.2 36.0 -  46.0 %   MCV 82.6 80.0 - 100.0 fL   MCH 25.4 (L) 26.0 - 34.0 pg   MCHC 30.8 30.0 - 36.0 g/dL   RDW 13.9 11.5 - 15.5 %   Platelets 323 150 - 400 K/uL   nRBC 0.0 0.0 - 0.2 %   MAU Course  Procedures Labetalol 20, 40  MDM Labs and HTN protocol ordered.  1800: Dr. Alesia Richards notified of presentation, clinical findings and recs for admission and agrees. Orders for Mg and admit placed.   Assessment and Plan   1. Severe pre-eclampsia, postpartum    Admit to [facility] unit [facility] per Dr. Bing Ree, CNM 04/09/2022, 6:39 PM

## 2022-04-09 NOTE — H&P (Incomplete)
Hospital day # 1 pregnancy at Unknown--36 y.o. [id number] s/p SVD 2 weeks ago admitted for PP PreE with SF (SR BP, HA, blurred vision).   HPI: Pt required x 2 doses IV labetalol in in MAU, no PCR taken r/t would not change POD, pt being admitted for PreE with SF with SR BP and blurred vision with HA. HA resolved prior to admission. CBC, CMP unremarkable. Pt started on Mag 4gm bolus at 1825 on 12/9, continues with 2gm /hr.  .  Patient Active Problem List   Diagnosis Date Noted   Pre-eclampsia, severe, postpartum condition 04/09/2022   Preeclampsia, severe 04/09/2022   Labor and delivery, indication for care 03/23/2022   Prediabetes 09/22/2021   AMA (advanced maternal age) multigravida 35+ 09/21/2021   Supervision of high-risk pregnancy 09/08/2021   Previous two preterm deliveries followed by two term deliveries, antepartum 03/18/2013     Active Ambulatory Problems    Diagnosis Date Noted   Previous two preterm deliveries followed by two term deliveries, antepartum 03/18/2013   Supervision of high-risk pregnancy 09/08/2021   AMA (advanced maternal age) multigravida 35+ 09/21/2021   Prediabetes 09/22/2021   Labor and delivery, indication for care 03/23/2022   Resolved Ambulatory Problems    Diagnosis Date Noted   Normal delivery 03/28/2012   Supervision of other normal pregnancy 03/18/2013   Pregnancy related nausea and vomiting, antepartum 03/18/2013   GBS (group B Streptococcus carrier), +RV culture, currently pregnant 09/21/2013   Pregnancy related symphysis pain in third trimester, antepartum 09/25/2013   Active labor 10/09/2013   Normal delivery 10/09/2013   Past Medical History:  Diagnosis Date   Gestational Diabetes    Preterm labor    Seasonal allergies    Vaginal Pap smear, abnormal      S:  Pt stable eating breakfast in room, denies any questions discussed POC, mag off at 1821 pm today, pt endorses having a mild HA that comes and goes, ordered motrin and Fioricet  for pain. Pt has a "swimmy feeling in my head and eye sight,  but I do need my glassless to fully see". Pt denies vision change, appears neurologically stable. Denies CP, SOB, RUQ pain.    O: BP 137/86 (BP Location: Left Arm)   Pulse 86   Temp 98.2 F (36.8 C) (Oral)   Resp 16   Ht 5' (1.524 m)   Wt 80.9 kg   SpO2 98%   Breastfeeding No   BMI 34.84 kg/m  Physical Exam Vitals and nursing note reviewed.  Constitutional:      Appearance: Normal appearance.  HENT:     Head: Normocephalic and atraumatic.     Nose: Nose normal.     Mouth/Throat:     Pharynx: Oropharynx is clear.  Eyes:     Conjunctiva/sclera: Conjunctivae normal.  Cardiovascular:     Rate and Rhythm: Normal rate and regular rhythm.     Pulses: Normal pulses.     Heart sounds: Normal heart sounds.  Pulmonary:     Effort: Pulmonary effort is normal.     Breath sounds: Normal breath sounds.  Abdominal:     General: Bowel sounds are normal.  Musculoskeletal:        General: Normal range of motion.     Cervical back: Normal range of motion and neck supple.  Skin:    General: Skin is warm.     Capillary Refill: Capillary refill takes less than 2 seconds.     Comments: No clonus, +2  DTR  Neurological:     General: No focal deficit present.     Mental Status: She is alert.  Psychiatric:        Mood and Affect: Mood normal.           Labs:  Did not obtain PCR r/t will not change POC, CBC, CMP unremakrable.        Meds: IV labetalol x2 doses in MAU.   A:  Hospital day # 1 pregnancy at Unknown--36 y.o. [id number] s/p SVD 2 weeks ago admitted for PP PreE with SF (SR BP, HA, blurred vision). Pt is stable on magnesium, BP 137/86, repeat cbc and cmp WNL, asymptomatic now, stable.  P: Continue current plan of care      IV labetalol if indicated for SR BP >160/110      Continue magnesium 2gm/hr, off time 12/10 @ 1900.      If BP continued to be elevated will start procardia '30mg'$  XL.       Anticipate discharge once 24  hours PP mag and BP stable on 12/11.        Motrin '600mg'$  Q6H PRN pain      Fioricet for HA.      Regular diet      SCD        Dr Alesia Richards to be updated of POC and pt status.   [place], FNP-C, PMHNP-BC  [address] # [place], [place] [address]  Cell: [phone]  Office Phone: [phone] Fax: [phone] 04/10/2022  11:32 AM

## 2022-04-10 LAB — CBC WITH DIFFERENTIAL/PLATELET
Abs Immature Granulocytes: 0.02 10*3/uL (ref 0.00–0.07)
Basophils Absolute: 0 10*3/uL (ref 0.0–0.1)
Basophils Relative: 0 %
Eosinophils Absolute: 0.2 10*3/uL (ref 0.0–0.5)
Eosinophils Relative: 2 %
HCT: 41 % (ref 36.0–46.0)
Hemoglobin: 12.6 g/dL (ref 12.0–15.0)
Immature Granulocytes: 0 %
Lymphocytes Relative: 32 %
Lymphs Abs: 2.2 10*3/uL (ref 0.7–4.0)
MCH: 25.2 pg — ABNORMAL LOW (ref 26.0–34.0)
MCHC: 30.7 g/dL (ref 30.0–36.0)
MCV: 82 fL (ref 80.0–100.0)
Monocytes Absolute: 0.7 10*3/uL (ref 0.1–1.0)
Monocytes Relative: 10 %
Neutro Abs: 3.8 10*3/uL (ref 1.7–7.7)
Neutrophils Relative %: 56 %
Platelets: 306 10*3/uL (ref 150–400)
RBC: 5 MIL/uL (ref 3.87–5.11)
RDW: 14.1 % (ref 11.5–15.5)
WBC: 6.8 10*3/uL (ref 4.0–10.5)
nRBC: 0 % (ref 0.0–0.2)

## 2022-04-10 LAB — COMPREHENSIVE METABOLIC PANEL
ALT: 20 U/L (ref 0–44)
AST: 26 U/L (ref 15–41)
Albumin: 3.1 g/dL — ABNORMAL LOW (ref 3.5–5.0)
Alkaline Phosphatase: 109 U/L (ref 38–126)
Anion gap: 11 (ref 5–15)
BUN: <5 mg/dL — ABNORMAL LOW (ref 6–20)
CO2: 27 mmol/L (ref 22–32)
Calcium: 7.5 mg/dL — ABNORMAL LOW (ref 8.9–10.3)
Chloride: 102 mmol/L (ref 98–111)
Creatinine, Ser: 0.68 mg/dL (ref 0.44–1.00)
GFR, Estimated: >60 mL/min (ref >60)
Glucose, Bld: 108 mg/dL — ABNORMAL HIGH (ref 70–99)
Potassium: 3.9 mmol/L (ref 3.5–5.1)
Sodium: 140 mmol/L (ref 135–145)
Total Bilirubin: 0.3 mg/dL (ref 0.3–1.2)
Total Protein: 6.4 g/dL — ABNORMAL LOW (ref 6.5–8.1)

## 2022-04-10 MED ORDER — BUTALBITAL-APAP-CAFFEINE 50-325-40 MG PO TABS
2.0000 | ORAL_TABLET | Freq: Four times a day (QID) | ORAL | Status: DC | PRN
  Administered 2022-04-10 – 2022-04-12 (×4): 2 via ORAL
  Filled 2022-04-10 (×4): qty 2

## 2022-04-10 NOTE — Progress Notes (Addendum)
Hospital day # 1 pregnancy at Unknown--36 y.o. [id number] s/p SVD 2 weeks ago admitted for PP PreE with SF (SR BP, HA, blurred vision).   HPI: Pt required x 2 doses IV labetalol in in MAU, no PCR taken r/t would not change POD, pt being admitted for PreE with SF with SR BP and blurred vision with HA. HA resolved prior to admission. CBC, CMP unremarkable. Pt started on Mag 4gm bolus at 1825 on 12/9, continues with 2gm /hr.  .  Patient Active Problem List   Diagnosis Date Noted   Pre-eclampsia, severe, postpartum condition 04/09/2022   Preeclampsia, severe 04/09/2022   Labor and delivery, indication for care 03/23/2022   Prediabetes 09/22/2021   AMA (advanced maternal age) multigravida 35+ 09/21/2021   Supervision of high-risk pregnancy 09/08/2021   Previous two preterm deliveries followed by two term deliveries, antepartum 03/18/2013     Active Ambulatory Problems    Diagnosis Date Noted   Previous two preterm deliveries followed by two term deliveries, antepartum 03/18/2013   Supervision of high-risk pregnancy 09/08/2021   AMA (advanced maternal age) multigravida 35+ 09/21/2021   Prediabetes 09/22/2021   Labor and delivery, indication for care 03/23/2022   Resolved Ambulatory Problems    Diagnosis Date Noted   Normal delivery 03/28/2012   Supervision of other normal pregnancy 03/18/2013   Pregnancy related nausea and vomiting, antepartum 03/18/2013   GBS (group B Streptococcus carrier), +RV culture, currently pregnant 09/21/2013   Pregnancy related symphysis pain in third trimester, antepartum 09/25/2013   Active labor 10/09/2013   Normal delivery 10/09/2013   Past Medical History:  Diagnosis Date   Gestational Diabetes    Preterm labor    Seasonal allergies    Vaginal Pap smear, abnormal      S:  Pt stable eating breakfast in room, denies any questions discussed POC, mag off at 1821 pm today, pt endorses having a mild HA that comes and goes, ordered motrin and Fioricet  for pain. Pt has a "swimmy feeling in my head and eye sight,  but I do need my glassless to fully see". Pt denies vision change, appears neurologically stable. Denies CP, SOB, RUQ pain.    O: BP 129/80 (BP Location: Left Arm)   Pulse 88   Temp 98 F (36.7 C) (Oral)   Resp 15   Ht 5' (1.524 m)   Wt 80.9 kg   SpO2 98%   Breastfeeding No   BMI 34.84 kg/m  Physical Exam Vitals and nursing note reviewed.  Constitutional:      Appearance: Normal appearance.  HENT:     Head: Normocephalic and atraumatic.     Nose: Nose normal.     Mouth/Throat:     Pharynx: Oropharynx is clear.  Eyes:     Conjunctiva/sclera: Conjunctivae normal.  Cardiovascular:     Rate and Rhythm: Normal rate and regular rhythm.     Pulses: Normal pulses.     Heart sounds: Normal heart sounds.  Pulmonary:     Effort: Pulmonary effort is normal.     Breath sounds: Normal breath sounds.  Abdominal:     General: Bowel sounds are normal.  Musculoskeletal:        General: Normal range of motion.     Cervical back: Normal range of motion and neck supple.  Skin:    General: Skin is warm.     Capillary Refill: Capillary refill takes less than 2 seconds.     Comments: No clonus, +2  DTR  Neurological:     General: No focal deficit present.     Mental Status: She is alert.  Psychiatric:        Mood and Affect: Mood normal.           Labs:  Did not obtain PCR r/t will not change POC, CBC, CMP unremakrable.        Meds: IV labetalol x2 doses in MAU.   A:  Hospital day # 1 pregnancy at Unknown--36 y.o. Sherri Castaneda s/p SVD 2 weeks ago admitted for PP PreE with SF (SR BP, HA, blurred vision). Pt is stable on magnesium, BP 137/86, repeat cbc and cmp WNL, asymptomatic now, stable.  P: Continue current plan of care      IV labetalol if indicated for SR BP >160/110      Continue magnesium 2gm/hr, off time 12/10 @ 1900.      If BP continued to be elevated will start procardia '30mg'$  XL.       Anticipate discharge once 24  hours PP mag and BP stable on 12/11.        Motrin '600mg'$  Q6H PRN pain      Fioricet for HA.      Regular diet      SCD        Dr Alesia Richards to be updated of POC and pt status.   [place], FNP-C, PMHNP-BC  [place] # [place], [place] [address]  Cell: [phone]  Office Phone: [phone] Fax: [phone] 04/10/2022  11:24 AM    Attestation of Attending Supervision of Advanced Practitioner (CNM/NP): Evaluation and management procedures were performed by the Advanced Practitioner under my supervision and collaboration.  I have reviewed the Advanced Practitioner's note and chart, and I agree with the management and plan.   I saw and examined patient at bedside and agree with above findings, assessment and plan as outlined above by [facility], CNM. Dr. Waymon Amato.  04/10/2022.

## 2022-04-11 MED ORDER — NIFEDIPINE ER OSMOTIC RELEASE 30 MG PO TB24
30.0000 mg | ORAL_TABLET | Freq: Every day | ORAL | Status: DC
  Administered 2022-04-11 – 2022-04-12 (×2): 30 mg via ORAL
  Filled 2022-04-11 (×2): qty 1

## 2022-04-11 NOTE — Progress Notes (Addendum)
Hospital day # 2  36 y.o. [id number] s/p NVD on 03/24/2022 re-admitted for post-partum pre-eclampsia w/ severe features.   HPI: Pt required x 2 doses IV labetalol in in MAU, and s/p 24hr MgSO4.   Patient Active Problem List   Diagnosis Date Noted   Pre-eclampsia, severe, postpartum condition 04/09/2022   Preeclampsia, severe 04/09/2022   Labor and delivery, indication for care 03/23/2022   Prediabetes 09/22/2021   AMA (advanced maternal age) multigravida 35+ 09/21/2021   Supervision of high-risk pregnancy 09/08/2021   Previous two preterm deliveries followed by two term deliveries, antepartum 03/18/2013     Active Ambulatory Problems    Diagnosis Date Noted   Previous two preterm deliveries followed by two term deliveries, antepartum 03/18/2013   Supervision of high-risk pregnancy 09/08/2021   AMA (advanced maternal age) multigravida 35+ 09/21/2021   Prediabetes 09/22/2021   Labor and delivery, indication for care 03/23/2022   Resolved Ambulatory Problems    Diagnosis Date Noted   Normal delivery 03/28/2012   Supervision of other normal pregnancy 03/18/2013   Pregnancy related nausea and vomiting, antepartum 03/18/2013   GBS (group B Streptococcus carrier), +RV culture, currently pregnant 09/21/2013   Pregnancy related symphysis pain in third trimester, antepartum 09/25/2013   Active labor 10/09/2013   Normal delivery 10/09/2013   Past Medical History:  Diagnosis Date   Gestational Diabetes    Preterm labor    Seasonal allergies    Vaginal Pap smear, abnormal      S:  Patient doing well today with no complaints She denies headache, blurry vision, chest pain, shortness of breath or RUQ pain. BP normotensive overnight; however, during the day she had elevated BP 150s/80s. States she was very anxious at the time for fear of not being able to go home today and missing her baby. Patient is calm now as she has baby and mother-in-law at bedside. Procardia 30XL qd started today.  Will monitor BP throughout the day and if stable may go home late tonight or tomorrow morning.   O: BP (!) 155/87 (BP Location: Left Arm)   Pulse 73   Temp 98.4 F (36.9 C) (Oral)   Resp 18   Ht 5' (1.524 m)   Wt 80.9 kg   SpO2 98%   Breastfeeding No   BMI 34.84 kg/m  Physical Exam Vitals and nursing note reviewed.  Constitutional:      Appearance: Normal appearance.  HENT:     Head: Normocephalic and atraumatic.     Nose: Nose normal.     Mouth/Throat:     Pharynx: Oropharynx is clear.  Eyes:     Conjunctiva/sclera: Conjunctivae normal.  Cardiovascular:     Rate and Rhythm: Normal rate and regular rhythm.     Pulses: Normal pulses.     Heart sounds: Normal heart sounds.  Pulmonary:     Effort: Pulmonary effort is normal.     Breath sounds: Normal breath sounds.  Abdominal:     General: Bowel sounds are normal.  Musculoskeletal:        General: Normal range of motion.     Cervical back: Normal range of motion and neck supple.  Skin:    General: Skin is warm.  Neurological:     General: No focal deficit present.     Mental Status: She is alert.  Psychiatric:        Mood and Affect: Mood normal.           Labs:  Did  not obtain PCR r/t will not change POC, CBC, CMP unremakrable.        Meds: IV labetalol x2 doses in MAU.   A:  Hospital day #2 - 36 y.o. G5P3205s/p NVD on 03/24/2022  admitted for PP PreE with SF (SR BP, HA, blurred vision).   P: S/p 24hr MgSO4      Start procardia '30mg'$  XL       Anticipate discharge late tonight or in AM if BP stable      Motrin '600mg'$  Q6H PRN pain      Fioricet for HA      Regular diet      SCD       F/u in office in 1 week for BP check    Dr. Bing Matter

## 2022-04-12 MED ORDER — NIFEDIPINE ER 30 MG PO TB24: 60.0000 mg | ORAL_TABLET | Freq: Every day | ORAL | 2 refills | Status: DC

## 2022-04-12 MED ORDER — BUTALBITAL-APAP-CAFFEINE 50-325-40 MG PO TABS: 2.0000 | ORAL_TABLET | Freq: Four times a day (QID) | ORAL | 0 refills | Status: AC | PRN

## 2022-04-12 MED ORDER — IBUPROFEN 600 MG PO TABS: 600.0000 mg | ORAL_TABLET | Freq: Four times a day (QID) | ORAL | 0 refills | Status: DC | PRN

## 2022-04-12 NOTE — Discharge Summary (Signed)
Physician Discharge Summary  Patient ID: Sherri Castaneda MRN: [id number] DOB/AGE: 1985-09-29 36 y.o.  Admit date: 04/09/2022 Discharge date: 04/12/2022  Admission Diagnoses:  Discharge Diagnoses:  Principal Problem:   Pre-eclampsia, severe, postpartum condition Active Problems:   Preeclampsia, severe   Discharged Condition: good  Hospital Course: Pt came in and was diagnosed with PP preeclampsia.  She was given magnesium and then placed on procardia.  Blood pressures have improved but are still mildly elevated.  Will increase procardia to 60 mg a day  Consults: None  Significant Diagnostic Studies: labs:   Treatments: IV hydration and magnesium and antihypertensives  Discharge Exam: Blood pressure (!) 132/90, pulse 86, temperature 98.1 F (36.7 C), temperature source Oral, resp. rate 17, height 5' (1.524 m), weight 80.9 kg, SpO2 99 %, not currently breastfeeding. General appearance: alert and cooperative Resp: clear to auscultation bilaterally Chest wall: no tenderness Cardio: regular rate and rhythm GI: soft, non-tender; bowel sounds normal; no masses,  no organomegaly Extremities: Homans sign is negative, no sign of DVT  Disposition: Discharge disposition: 01-Home or Self Care        Allergies as of 04/12/2022       Reactions   Flagyl [metronidazole] Swelling   Pineapple Itching, Swelling   Latex Itching, Rash        Medication List     TAKE these medications    butalbital-acetaminophen-caffeine 50-325-40 MG tablet Commonly known as: FIORICET Take 2 tablets by mouth every 6 (six) hours as needed for headache.   ferrous sulfate 325 (65 FE) MG tablet Take 1 tablet (325 mg total) by mouth every other day.   ibuprofen 600 MG tablet Commonly known as: ADVIL Take 1 tablet (600 mg total) by mouth every 6 (six) hours as needed for mild pain.   NIFEdipine 30 MG 24 hr tablet Commonly known as: ADALAT CC Take 2 tablets (60 mg total) by mouth daily. Start  taking on: April 13, 2022   pantoprazole 40 MG tablet Commonly known as: PROTONIX Take 40 mg by mouth every evening.   prenatal multivitamin Tabs tablet Take 1 tablet by mouth in the morning.        [facility] Follow up in 1 week(s).   Specialty: Obstetrics and Gynecology Why: for blood pressure check Contact information: [address] [place] [address] [phone]                Signed: Betsy Coder 04/12/2022, 3:41 PM

## 2022-04-13 ENCOUNTER — Telehealth: Payer: Self-pay | Admitting: Emergency Medicine

## 2022-04-13 NOTE — Telephone Encounter (Signed)
Attempted TC to patient to follow up regarding BP readings, new Rx.

## 2022-06-07 ENCOUNTER — Encounter (HOSPITAL_COMMUNITY): Payer: Self-pay

## 2022-06-07 ENCOUNTER — Emergency Department (HOSPITAL_COMMUNITY): Payer: 59

## 2022-06-07 ENCOUNTER — Observation Stay (HOSPITAL_COMMUNITY)
Admission: EM | Admit: 2022-06-07 | Discharge: 2022-06-08 | Disposition: A | Discharge status: Home / Self Care | Payer: 59 | Attending: Internal Medicine | Admitting: Internal Medicine

## 2022-06-07 ENCOUNTER — Other Ambulatory Visit: Payer: Self-pay

## 2022-06-07 DIAGNOSIS — R002 Palpitations: Secondary | ICD-10-CM | POA: Diagnosis present

## 2022-06-07 DIAGNOSIS — K219 Gastro-esophageal reflux disease without esophagitis: Secondary | ICD-10-CM

## 2022-06-07 DIAGNOSIS — U071 COVID-19: Secondary | ICD-10-CM | POA: Diagnosis not present

## 2022-06-07 DIAGNOSIS — R112 Nausea with vomiting, unspecified: Secondary | ICD-10-CM | POA: Diagnosis not present

## 2022-06-07 DIAGNOSIS — O1415 Severe pre-eclampsia, complicating the puerperium: Secondary | ICD-10-CM | POA: Diagnosis present

## 2022-06-07 DIAGNOSIS — Z91040 Latex allergy status: Secondary | ICD-10-CM | POA: Diagnosis not present

## 2022-06-07 DIAGNOSIS — Z79899 Other long term (current) drug therapy: Secondary | ICD-10-CM | POA: Insufficient documentation

## 2022-06-07 DIAGNOSIS — R000 Tachycardia, unspecified: Principal | ICD-10-CM | POA: Insufficient documentation

## 2022-06-07 LAB — URINALYSIS, ROUTINE W REFLEX MICROSCOPIC
Bacteria, UA: NONE SEEN
Bilirubin Urine: NEGATIVE
Glucose, UA: NEGATIVE mg/dL
Hgb urine dipstick: NEGATIVE
Ketones, ur: NEGATIVE mg/dL
Leukocytes,Ua: NEGATIVE
Nitrite: NEGATIVE
Protein, ur: NEGATIVE mg/dL
Specific Gravity, Urine: 1.014 (ref 1.005–1.030)
pH: 5 (ref 5.0–8.0)

## 2022-06-07 LAB — CBC
HCT: 40.3 % (ref 36.0–46.0)
Hemoglobin: 12.8 g/dL (ref 12.0–15.0)
MCH: 25.5 pg — ABNORMAL LOW (ref 26.0–34.0)
MCHC: 31.8 g/dL (ref 30.0–36.0)
MCV: 80.4 fL (ref 80.0–100.0)
Platelets: 228 10*3/uL (ref 150–400)
RBC: 5.01 MIL/uL (ref 3.87–5.11)
RDW: 13.9 % (ref 11.5–15.5)
WBC: 10.8 10*3/uL — ABNORMAL HIGH (ref 4.0–10.5)
nRBC: 0 % (ref 0.0–0.2)

## 2022-06-07 LAB — RESP PANEL BY RT-PCR (RSV, FLU A&B, COVID)  RVPGX2
Influenza A by PCR: NEGATIVE
Influenza B by PCR: NEGATIVE
Resp Syncytial Virus by PCR: NEGATIVE
SARS Coronavirus 2 by RT PCR: POSITIVE — AB

## 2022-06-07 LAB — RAPID URINE DRUG SCREEN, HOSP PERFORMED
Amphetamines: NOT DETECTED
Barbiturates: NOT DETECTED
Benzodiazepines: NOT DETECTED
Cocaine: NOT DETECTED
Opiates: NOT DETECTED
Tetrahydrocannabinol: NOT DETECTED

## 2022-06-07 LAB — TSH: TSH: 0.433 u[IU]/mL (ref 0.350–4.500)

## 2022-06-07 LAB — COMPREHENSIVE METABOLIC PANEL
ALT: 19 U/L (ref 0–44)
AST: 19 U/L (ref 15–41)
Albumin: 4.2 g/dL (ref 3.5–5.0)
Alkaline Phosphatase: 82 U/L (ref 38–126)
Anion gap: 7 (ref 5–15)
BUN: 12 mg/dL (ref 6–20)
CO2: 26 mmol/L (ref 22–32)
Calcium: 8.8 mg/dL — ABNORMAL LOW (ref 8.9–10.3)
Chloride: 104 mmol/L (ref 98–111)
Creatinine, Ser: 0.7 mg/dL (ref 0.44–1.00)
GFR, Estimated: >60 mL/min (ref >60)
Glucose, Bld: 102 mg/dL — ABNORMAL HIGH (ref 70–99)
Potassium: 3.6 mmol/L (ref 3.5–5.1)
Sodium: 137 mmol/L (ref 135–145)
Total Bilirubin: 0.5 mg/dL (ref 0.3–1.2)
Total Protein: 7.7 g/dL (ref 6.5–8.1)

## 2022-06-07 LAB — D-DIMER, QUANTITATIVE: D-Dimer, Quant: 0.29 ug/mL-FEU (ref 0.00–0.50)

## 2022-06-07 LAB — HCG, SERUM, QUALITATIVE: Preg, Serum: NEGATIVE

## 2022-06-07 LAB — TROPONIN I (HIGH SENSITIVITY)
Troponin I (High Sensitivity): 3 ng/L (ref <18)
Troponin I (High Sensitivity): 3 ng/L (ref <18)

## 2022-06-07 LAB — LIPASE, BLOOD: Lipase: 39 U/L (ref 11–51)

## 2022-06-07 LAB — T4, FREE: Free T4: 0.73 ng/dL (ref 0.61–1.12)

## 2022-06-07 MED ORDER — LACTATED RINGERS IV BOLUS
500.0000 mL | Freq: Once | INTRAVENOUS | Status: AC
  Administered 2022-06-07: 500 mL via INTRAVENOUS

## 2022-06-07 MED ORDER — SODIUM CHLORIDE 0.9 % IV BOLUS
1000.0000 mL | Freq: Once | INTRAVENOUS | Status: AC
  Administered 2022-06-07: 1000 mL via INTRAVENOUS

## 2022-06-07 MED ORDER — METOPROLOL TARTRATE 5 MG/5ML IV SOLN
5.0000 mg | Freq: Once | INTRAVENOUS | Status: AC
  Administered 2022-06-07: 5 mg via INTRAVENOUS
  Filled 2022-06-07: qty 5

## 2022-06-07 MED ORDER — LACTATED RINGERS IV BOLUS
1000.0000 mL | Freq: Once | INTRAVENOUS | Status: AC
  Administered 2022-06-08: 1000 mL via INTRAVENOUS

## 2022-06-07 MED ORDER — LACTATED RINGERS IV SOLN: INTRAVENOUS | Status: DC

## 2022-06-07 NOTE — ED Notes (Signed)
MD notified of patients HR

## 2022-06-07 NOTE — ED Provider Notes (Signed)
11:46 PM Assumed care of patient from off-going team. For more details, please see note from same day.  In brief, this is a 37 y.o. female w/ h/o postpartum preeclampsia on nifedipine. 2 months postpartum now. P/w sinus congestion, N/V, dizziness. Diagnosed with covid here. Is tachycardic, getting 2nd liter of fluids and lopressor. Dimer negative.  Was told to stop taking her nifedipine 04/27/22 d/t BP being stable. Started going back up on 04/28/22 and started her nifedipine again.  Plan/Dispo at time of sign-out & ED Course since sign-out: '[ ]'$  labs, fluids  BP 126/82   Pulse (!) 118   Temp 98.2 F (36.8 C)   Resp 15   Ht 5' (1.524 m)   Wt 80 kg   SpO2 97%   Breastfeeding Unknown   BMI 34.44 kg/m    ED Course:   Clinical Course as of 06/07/22 2346  Tue Jun 07, 2022  1800 SARS Coronavirus 2 by RT PCR(!): POSITIVE [HN]  1826 No signs of pericarditis on EKG, but patient persistently tachycardic despite fluids. Dimer negative. Troponin pending. [HN]  1844 Troponin I (High Sensitivity): 3 [HN]  2009 ECG Heart Rate(!): 108 Improving [HN]  2009 Will complete a 30 cc/kg bolus with another 500 cc LR [HN]  2211 ECG Heart Rate(!): 105 Tachycardia resolving with fluids [HN]  2311 Despite fluid 30 cc/kg bolus, patient's HR 120s-130s, sinus tachycardia. No pericarditis on EKG. No CP/SOB. Patient sates she has never had high HR before. Neg dimer, no signs of dehydration on labs. I do not have explanation for patient's high HR but I do not feel comfortable discharging patient with such significant tachycardia of unknown etiology. Will consult for admission. [HN]  2346 D/w hospitalist who recommends another L of fluid and will see patient. [HN]    Clinical Course User Index [HN] Audley Hose, MD    Dispo: Admit ------------------------------- Cindee Lame, MD Emergency Medicine  This note was created using dictation software, which may contain spelling or grammatical errors.   Audley Hose, MD 06/07/22 [phone]

## 2022-06-07 NOTE — Discharge Instructions (Signed)
It was a pleasure caring for you today in the emergency department.  Please return to the emergency department for any worsening or worrisome symptoms.  

## 2022-06-07 NOTE — H&P (Incomplete)
History and Physical    Sherri Castaneda OAC:[id number] DOB: 1986-03-26 DOA: 06/07/2022  PCP: Andria Frames, PA-C  Patient coming from: Home  Chief Complaint: Palpitations  HPI: Sherri Castaneda is a 37 y.o. female with medical history significant of gestational diabetes, postpartum preeclampsia on nifedipine, approximately 2 months postpartum, GERD presented to ED with sinus congestion, nausea, vomiting, dizziness, and palpitations.  Tachycardic to the 130s in the ED, sinus rhythm.  Afebrile.  SARS-CoV-2 PCR positive.  Labs showing WBC 10.8, hemoglobin 12.8, BUN 12, creatinine 0.7, normal lipase and LFTs, troponin negative x 2, D-dimer negative, UA not suggestive of infection, TSH and free T4 normal, serum pregnancy test negative, UDS negative.  Chest x-ray showing no acute cardiopulmonary process. Tachycardia persisted despite 2.5 L IV fluid boluses and IV metoprolol 5 mg.  TRH called to admit.  ED Course: ***  Review of Systems:  ROS  Past Medical History:  Diagnosis Date  . Gestational Diabetes   . Prediabetes   . Preterm labor    Delivery at 32 weeks and 34 weeks.  . Seasonal allergies   . Vaginal Pap smear, abnormal     Past Surgical History:  Procedure Laterality Date  . BILATERAL KNEE ARTHROSCOPY    . GYNECOLOGIC CRYOSURGERY  2006     reports that she has never smoked. She has never used smokeless tobacco. She reports that she does not drink alcohol and does not use drugs.  Allergies  Allergen Reactions  . Flagyl [Metronidazole] Swelling and Other (See Comments)    Tongue tingles  . Pineapple Itching, Swelling and Other (See Comments)    Throat tingles  . Latex Itching and Rash    Family History  Problem Relation Age of Onset  . Cancer Mother   . Cancer Father   . Hypertension Maternal Uncle   . Cancer Maternal Grandmother   . Asthma Neg Hx   . Birth defects Neg Hx   . Diabetes Neg Hx   . Heart disease Neg Hx     Prior to Admission medications    Medication Sig Start Date End Date Taking? Authorizing Provider  butalbital-acetaminophen-caffeine (FIORICET) 50-325-40 MG tablet Take 2 tablets by mouth every 6 (six) hours as needed for headache. 04/12/22  Yes Dillard, Naima, MD  ferrous sulfate 325 (65 FE) MG tablet Take 1 tablet (325 mg total) by mouth every other day. Patient taking differently: Take 325 mg by mouth daily with breakfast. 10/19/21  Yes Woodroe Mode, MD  ibuprofen (ADVIL) 600 MG tablet Take 1 tablet (600 mg total) by mouth every 6 (six) hours as needed for mild pain. 04/12/22  Yes Dillard, Naima, MD  NIFEdipine (ADALAT CC) 30 MG 24 hr tablet Take 2 tablets (60 mg total) by mouth daily. Patient taking differently: Take 30 mg by mouth in the morning. 04/13/22 07/12/22 Yes Dillard, Naima, MD  pantoprazole (PROTONIX) 40 MG tablet Take 40 mg by mouth daily before breakfast.   Yes [provider]  TYLENOL SINUS SEVERE 5-325-200 MG TABS Take 1 tablet by mouth every 6 (six) hours as needed (for cold-like symptoms).   Yes [provider]    Physical Exam: Vitals:   06/07/22 2200 06/07/22 2245 06/07/22 2258 06/07/22 2300  BP: 124/88   126/82  Pulse: (!) 120 (!) 101 (!) 131 (!) 118  Resp: 12 19 (!) 24 15  Temp:      TempSrc:      SpO2: 99% 95% 99% 97%  Weight:  Height:        Physical Exam  Labs on Admission: I have personally reviewed following labs and imaging studies  CBC: Recent Labs  Lab 06/07/22 1446  WBC 10.8*  HGB 12.8  HCT 40.3  MCV 80.4  PLT 846   Basic Metabolic Panel: Recent Labs  Lab 06/07/22 1446  NA 137  K 3.6  CL 104  CO2 26  GLUCOSE 102*  BUN 12  CREATININE 0.70  CALCIUM 8.8*   GFR: Estimated Creatinine Clearance: 90.1 mL/min (by C-G formula based on SCr of 0.7 mg/dL). Liver Function Tests: Recent Labs  Lab 06/07/22 1446  AST 19  ALT 19  ALKPHOS 82  BILITOT 0.5  PROT 7.7  ALBUMIN 4.2   Recent Labs  Lab 06/07/22 1446  LIPASE 39   No results for  input(s): "AMMONIA" in the last 168 hours. Coagulation Profile: No results for input(s): "INR", "PROTIME" in the last 168 hours. Cardiac Enzymes: No results for input(s): "CKTOTAL", "CKMB", "CKMBINDEX", "TROPONINI" in the last 168 hours. BNP (last 3 results) No results for input(s): "PROBNP" in the last 8760 hours. HbA1C: No results for input(s): "HGBA1C" in the last 72 hours. CBG: No results for input(s): "GLUCAP" in the last 168 hours. Lipid Profile: No results for input(s): "CHOL", "HDL", "LDLCALC", "TRIG", "CHOLHDL", "LDLDIRECT" in the last 72 hours. Thyroid Function Tests: Recent Labs    06/07/22 1447  TSH 0.433  FREET4 0.73   Anemia Panel: No results for input(s): "VITAMINB12", "FOLATE", "FERRITIN", "TIBC", "IRON", "RETICCTPCT" in the last 72 hours. Urine analysis:    Component Value Date/Time   COLORURINE STRAW (A) 06/07/2022 1447   APPEARANCEUR CLEAR 06/07/2022 1447   LABSPEC 1.014 06/07/2022 1447   PHURINE 5.0 06/07/2022 1447   GLUCOSEU NEGATIVE 06/07/2022 1447   HGBUR NEGATIVE 06/07/2022 [place] 06/07/2022 1447   BILIRUBINUR NEGATIVE 07/11/2018 1531   KETONESUR NEGATIVE 06/07/2022 1447   PROTEINUR NEGATIVE 06/07/2022 1447   UROBILINOGEN 0.2 07/11/2018 1531   UROBILINOGEN 0.2 06/02/2013 1050   NITRITE NEGATIVE 06/07/2022 1447   LEUKOCYTESUR NEGATIVE 06/07/2022 1447    Radiological Exams on Admission: DG Chest Port 1 View  Result Date: 06/07/2022 CLINICAL DATA:  Nausea vomiting dizziness palpitations EXAM: PORTABLE CHEST - 1 VIEW COMPARISON:  None Available. FINDINGS: Cardiac silhouette is unremarkable. No pneumothorax or pleural effusion. The lungs are clear. The visualized skeletal structures are unremarkable. IMPRESSION: No acute cardiopulmonary process. Electronically Signed   By: Sammie Bench M.D.   On: 06/07/2022 15:15    EKG: Independently reviewed.  Sinus tachycardia, nonspecific T wave abnormality.  No prior tracing for  comparison.  Assessment and Plan  COVID-19 positive Patient is tachycardic but does not meet any other SIRS criteria.  Not endorsing any respiratory symptoms.  Not hypoxic.  Chest x-ray not suggestive of pneumonia. -No indication for treatment at this time -Isolation precautions  Sinus tachycardia Etiology unclear.  She is testing positive for COVID  History of postpartum preeclampsia No hypertension or proteinuria at this time.  She is on nifedipine at home. -Continue nifedipine given tachycardia  gestational diabetes, postpartum preeclampsia on nifedipine, approximately 2 months postpartum, GERD presented to ED with sinus congestion, nausea, vomiting, dizziness, and palpitations.  Tachycardic to the 130s in the ED, sinus rhythm.  Afebrile.  SARS-CoV-2 PCR positive.  Labs showing WBC 10.8, hemoglobin 12.8, BUN 12, creatinine 0.7, normal lipase and LFTs, troponin negative x 2, D-dimer negative, UA not suggestive of infection, TSH and free T4 normal, serum pregnancy  test negative, UDS negative.  Chest x-ray showing no acute cardiopulmonary process. Tachycardia persisted despite 2.5 L IV fluid boluses and IV metoprolol 5 mg.  TRH called to admit.  DVT prophylaxis: {Blank single:19197::"Lovenox","SQ Heparin","IV heparin gtt","Xarelto","Eliquis","Coumadin","SCDs","***"} Code Status: {Blank single:19197::"Full Code","DNR","DNR/DNI","Comfort Care","***"} Family Communication: ***  Consults called: ***  Level of care: {Blank single:19197::"Med-Surg","Telemetry bed","Progressive Care Unit","Step Down Unit"} Admission status: *** Time Spent: 75+ minutes***  Shela Leff MD [facility]  If 7PM-7AM, please contact night-coverage [email or web address]  06/07/2022, 11:46 PM

## 2022-06-07 NOTE — ED Provider Notes (Signed)
[place] [facility] Provider Note  CSN: [id number] Arrival date & time: 06/07/22 1422  Chief Complaint(s) Palpitations and Nausea  HPI Sherri Castaneda is a 37 y.o. female with past medical history as below, significant for gestational diabetes, postpartum preeclampsia on nifedipine, approximately 2 months postpartum who presents to the ED with complaint of elev HR. patient reports sinus congestion over the past 2 to 3 days, children with similar URI type symptoms.  She took cold medication this afternoon after taking her nifedipine began to have sensation of rapid heart rate.  Heart rate 130-140. Denies similar issues with elevated heart rate in the past.  No history of DVT or PE, no known thyroid abnormalities, no illicit substance use, no excessive stimulant use, no cocaine use.  No chest pain or dyspnea.  Other than the palpitations and the sinus congestion she has no other complaints.  She was on nifedipine 2/2 pp PRE E, this was d/c'd bc her BP improved but pt restarted the medication when her bp became elevated again. Was having some side effects from it noted in primary care documentation including dizziness/nausea which she denies currently.    Past Medical History Past Medical History:  Diagnosis Date   Gestational Diabetes    Prediabetes    Preterm labor    Delivery at 32 weeks and 34 weeks.   Seasonal allergies    Vaginal Pap smear, abnormal    Patient Active Problem List   Diagnosis Date Noted   Pre-eclampsia, severe, postpartum condition 04/09/2022   Preeclampsia, severe 04/09/2022   Labor and delivery, indication for care 03/23/2022   Prediabetes 09/22/2021   AMA (advanced maternal age) multigravida 35+ 09/21/2021   Supervision of high-risk pregnancy 09/08/2021   Previous two preterm deliveries followed by two term deliveries, antepartum 03/18/2013   Home Medication(s) Prior to Admission medications   Medication Sig Start Date End  Date Taking? Authorizing Provider  butalbital-acetaminophen-caffeine (FIORICET) 50-325-40 MG tablet Take 2 tablets by mouth every 6 (six) hours as needed for headache. 04/12/22  Yes Dillard, Naima, MD  ferrous sulfate 325 (65 FE) MG tablet Take 1 tablet (325 mg total) by mouth every other day. Patient taking differently: Take 325 mg by mouth daily with breakfast. 10/19/21  Yes Woodroe Mode, MD  ibuprofen (ADVIL) 600 MG tablet Take 1 tablet (600 mg total) by mouth every 6 (six) hours as needed for mild pain. 04/12/22  Yes Dillard, Naima, MD  NIFEdipine (ADALAT CC) 30 MG 24 hr tablet Take 2 tablets (60 mg total) by mouth daily. Patient taking differently: Take 30 mg by mouth in the morning. 04/13/22 07/12/22 Yes Dillard, Naima, MD  pantoprazole (PROTONIX) 40 MG tablet Take 40 mg by mouth daily before breakfast.   Yes [provider]  TYLENOL SINUS SEVERE 5-325-200 MG TABS Take 1 tablet by mouth every 6 (six) hours as needed (for cold-like symptoms).   Yes [provider]  Past Surgical History Past Surgical History:  Procedure Laterality Date   BILATERAL KNEE ARTHROSCOPY     GYNECOLOGIC CRYOSURGERY  2006   Family History Family History  Problem Relation Age of Onset   Cancer Mother    Cancer Father    Hypertension Maternal Uncle    Cancer Maternal Grandmother    Asthma Neg Hx    Birth defects Neg Hx    Diabetes Neg Hx    Heart disease Neg Hx     Social History Social History   Tobacco Use   Smoking status: Never   Smokeless tobacco: Never  Vaping Use   Vaping Use: Never used  Substance Use Topics   Alcohol use: No   Drug use: No   Allergies Flagyl [metronidazole], Pineapple, and Latex  Review of Systems Review of Systems  Constitutional:  Negative for activity change and fever.  HENT:  Positive for congestion, postnasal drip,  sinus pressure and sinus pain. Negative for facial swelling and trouble swallowing.   Eyes:  Negative for discharge and redness.  Respiratory:  Negative for cough and shortness of breath.   Cardiovascular:  Positive for palpitations. Negative for chest pain.  Gastrointestinal:  Negative for abdominal pain and nausea.  Genitourinary:  Negative for dysuria and flank pain.  Musculoskeletal:  Negative for back pain and gait problem.  Skin:  Negative for pallor and rash.  Neurological:  Negative for syncope and headaches.    Physical Exam Vital Signs  I have reviewed the triage vital signs BP (!) 136/95   Pulse (!) 109   Temp 98.2 F (36.8 C)   Resp 12   Ht 5' (1.524 m)   Wt 80 kg   SpO2 96%   Breastfeeding Unknown   BMI 34.44 kg/m  Physical Exam Vitals and nursing note reviewed.  Constitutional:      General: She is not in acute distress.    Appearance: Normal appearance.  HENT:     Head: Normocephalic and atraumatic. No raccoon eyes, Battle's sign, right periorbital erythema or left periorbital erythema.     Jaw: There is normal jaw occlusion.     Comments: No obvious thyromegaly    Right Ear: External ear normal.     Left Ear: External ear normal.     Nose: Nose normal.     Mouth/Throat:     Mouth: Mucous membranes are moist.  Eyes:     General: No scleral icterus.       Right eye: No discharge.        Left eye: No discharge.  Cardiovascular:     Rate and Rhythm: Regular rhythm. Tachycardia present.     Pulses: Normal pulses.     Heart sounds: Normal heart sounds.  Pulmonary:     Effort: Pulmonary effort is normal. No respiratory distress.     Breath sounds: Normal breath sounds.  Abdominal:     General: Abdomen is flat.     Palpations: Abdomen is soft.     Tenderness: There is no abdominal tenderness.  Musculoskeletal:     Cervical back: No rigidity.     Right lower leg: No edema.     Left lower leg: No edema.  Skin:    General: Skin is warm and dry.      Capillary Refill: Capillary refill takes less than 2 seconds.  Neurological:     Mental Status: She is alert and oriented to person, place, and time.     GCS: GCS eye subscore  is 4. GCS verbal subscore is 5. GCS motor subscore is 6.  Psychiatric:        Mood and Affect: Mood normal.        Behavior: Behavior normal.     ED Results and Treatments Labs (all labs ordered are listed, but only abnormal results are displayed) Labs Reviewed  RESP PANEL BY RT-PCR (RSV, FLU A&B, COVID)  RVPGX2 - Abnormal; Notable for the following components:      Result Value   SARS Coronavirus 2 by RT PCR POSITIVE (*)    All other components within normal limits  COMPREHENSIVE METABOLIC PANEL - Abnormal; Notable for the following components:   Glucose, Bld 102 (*)    Calcium 8.8 (*)    All other components within normal limits  CBC - Abnormal; Notable for the following components:   WBC 10.8 (*)    MCH 25.5 (*)    All other components within normal limits  URINALYSIS, ROUTINE W REFLEX MICROSCOPIC - Abnormal; Notable for the following components:   Color, Urine STRAW (*)    All other components within normal limits  LIPASE, BLOOD  TSH  D-DIMER, QUANTITATIVE  RAPID URINE DRUG SCREEN, HOSP PERFORMED  HCG, SERUM, QUALITATIVE  CBC WITH DIFFERENTIAL/PLATELET  T4, FREE  TROPONIN I (HIGH SENSITIVITY)  TROPONIN I (HIGH SENSITIVITY)                                                                                                                          Radiology DG Chest Port 1 View  Result Date: 06/07/2022 CLINICAL DATA:  Nausea vomiting dizziness palpitations EXAM: PORTABLE CHEST - 1 VIEW COMPARISON:  None Available. FINDINGS: Cardiac silhouette is unremarkable. No pneumothorax or pleural effusion. The lungs are clear. The visualized skeletal structures are unremarkable. IMPRESSION: No acute cardiopulmonary process. Electronically Signed   By: Sammie Bench M.D.   On: 06/07/2022 15:15    Pertinent labs  & imaging results that were available during my care of the patient were reviewed by me and considered in my medical decision making (see MDM for details).  Medications Ordered in ED Medications  lactated ringers bolus 500 mL (has no administration in time range)  sodium chloride 0.9 % bolus 1,000 mL (0 mLs Intravenous Stopped 06/07/22 1801)  sodium chloride 0.9 % bolus 1,000 mL (0 mLs Intravenous Stopped 06/07/22 2013)  metoprolol tartrate (LOPRESSOR) injection 5 mg (5 mg Intravenous Given 06/07/22 1807)  Procedures Procedures  (including critical care time)  Medical Decision Making / ED Course    Medical Decision Making:    SAPHIRA LAHMANN is a 37 y.o. female with past medical history as below, significant for gestational diabetes, postpartum preeclampsia on diltiazem, approximately 2 months postpartum who presents to the ED with complaint of elev HR. Marland Kitchen The complaint involves an extensive differential diagnosis and also carries with it a high risk of complications and morbidity.  Serious etiology was considered. Ddx includes but is not limited to: Electrolyte abnormality, endocrine abnormality, metabolic disturbance, medication effect, PE, etc.  Complete initial physical exam performed, notably the patient  was resting comfortably, pressures stable, having palpitations, heart rate elevated.  No chest pain, dyspnea, vision changes, no headache.  Reviewed and confirmed nursing documentation for past medical history, family history, social history.  Vital signs reviewed.    Clinical Course as of 06/07/22 2025  Tue Jun 07, 2022  1800 SARS Coronavirus 2 by RT PCR(!): POSITIVE [HN]  1826 No signs of pericarditis on EKG, but patient persistently tachycardic despite fluids. Dimer negative. Troponin pending. [HN]  1844 Troponin I (High Sensitivity): 3 [HN]  2009 ECG  Heart Rate(!): 108 Improving [HN]  2009 Will complete a 30 cc/kg bolus with another 500 cc LR [HN]    Clinical Course User Index [HN] Audley Hose, MD     Pt here with elev hr, she has uri s/s but o/w feels wnl. She feels her symptoms started today after taking nifedipine and cold medication, was sent over from [facility] for eval. Sinus tachy on cardiac monitor. No hx dvt/pe, dimer was neg, wells low; PE unlikely. EKG with sinus tachy, no cp. Thyroid panel pending, uds pending. Trop pending as well. Pt is not breast feeding, will give IVF and lopressor and recheck. She has COVID19, possible etiology of her tachy, also could be adverse effect of nifedipine, unclear at this time. Pt signed out to incoming EDP pending recheck and remainder of labs.  If improvement after 1st dose lopressor may benefit from repeat dosing, continued IVF.      Additional history obtained: -Additional history obtained from spouse -External records from outside source obtained and reviewed including: Chart review including previous notes, labs, imaging, consultation notes including primary care documentation, prior admission documentation, prior labs and imaging, home medications   Lab Tests: -I ordered, reviewed, and interpreted labs.   The pertinent results include:   Labs Reviewed  RESP PANEL BY RT-PCR (RSV, FLU A&B, COVID)  RVPGX2 - Abnormal; Notable for the following components:      Result Value   SARS Coronavirus 2 by RT PCR POSITIVE (*)    All other components within normal limits  COMPREHENSIVE METABOLIC PANEL - Abnormal; Notable for the following components:   Glucose, Bld 102 (*)    Calcium 8.8 (*)    All other components within normal limits  CBC - Abnormal; Notable for the following components:   WBC 10.8 (*)    MCH 25.5 (*)    All other components within normal limits  URINALYSIS, ROUTINE W REFLEX MICROSCOPIC - Abnormal; Notable for the following components:   Color, Urine STRAW (*)    All  other components within normal limits  LIPASE, BLOOD  TSH  D-DIMER, QUANTITATIVE  RAPID URINE DRUG SCREEN, HOSP PERFORMED  HCG, SERUM, QUALITATIVE  CBC WITH DIFFERENTIAL/PLATELET  T4, FREE  TROPONIN I (HIGH SENSITIVITY)  TROPONIN I (HIGH SENSITIVITY)    Notable for covid +  EKG  EKG Interpretation  Date/Time:  Tuesday June 07 2022 15:21:58 EST Ventricular Rate:  134 PR Interval:  121 QRS Duration: 82 QT Interval:  298 QTC Calculation: 445 R Axis:   89 Text Interpretation: Sinus tachycardia Borderline repol abnormality, diffuse leads no stemi Confirmed by Wynona Dove ([id number]) on 06/07/2022 3:38:30 PM         Imaging Studies ordered: I ordered imaging studies including cxr I independently visualized the following imaging with scope of interpretation limited to determining acute life threatening conditions related to emergency care: xr chest, which revealed wnl I independently visualized and interpreted imaging. I agree with the radiologist interpretation   Medicines ordered and prescription drug management: Meds ordered this encounter  Medications   sodium chloride 0.9 % bolus 1,000 mL   sodium chloride 0.9 % bolus 1,000 mL   metoprolol tartrate (LOPRESSOR) injection 5 mg   lactated ringers bolus 500 mL    -I have reviewed the patients home medicines and have made adjustments as needed   Consultations Obtained: na   Cardiac Monitoring: The patient was maintained on a cardiac monitor.  I personally viewed and interpreted the cardiac monitored which showed an underlying rhythm of: sinus tachy  Social Determinants of Health:  Diagnosis or treatment significantly limited by social determinants of health: recent childbirth (not breastfeeding)   Reevaluation: After the interventions noted above, I reevaluated the patient and found that they have improved  Co morbidities that complicate the patient evaluation  Past Medical History:  Diagnosis Date    Gestational Diabetes    Prediabetes    Preterm labor    Delivery at 32 weeks and 34 weeks.   Seasonal allergies    Vaginal Pap smear, abnormal       Dispostion: Disposition decision including need for hospitalization was considered, and patient dispo pending at shift change    Final Clinical Impression(s) / ED Diagnoses Final diagnoses:  COVID-19  Sinus tachycardia     This chart was dictated using voice recognition software.  Despite best efforts to proofread,  errors can occur which can change the documentation meaning.    Jeanell Sparrow, DO 06/07/22 2026

## 2022-06-07 NOTE — ED Triage Notes (Signed)
BIB EMS from Dr office with c/o N/V, dizziness, palpitations. Pt is 2 months post partum. 125 HR after 500 cc bolus 18 lac

## 2022-06-07 NOTE — H&P (Addendum)
History and Physical    Sherri Castaneda VBT:[id number] DOB: 03-20-1986 DOA: 06/07/2022  PCP: Andria Frames, PA-C  Patient coming from: Home  Chief Complaint: Palpitations  HPI: Sherri Castaneda is a 37 y.o. female with medical history significant of gestational diabetes, postpartum preeclampsia on nifedipine, approximately 2 months postpartum, GERD presented to ED with sinus congestion, nausea, vomiting, dizziness, and palpitations.  Tachycardic to the 130s in the ED, sinus rhythm.  Afebrile.  SARS-CoV-2 PCR positive.  Labs showing WBC 10.8, hemoglobin 12.8, normal lipase and LFTs, troponin negative x 2, D-dimer negative, UA not suggestive of infection, TSH and free T4 normal, serum pregnancy test negative, UDS negative.  Chest x-ray showing no acute cardiopulmonary process. Tachycardia persisted despite 2.5 L IV fluid boluses and IV metoprolol 5 mg.  TRH called to admit.  Patient reports poor appetite/not drinking enough fluids and sinus congestion for the past few days.  She took an over-the-counter nasal decongestant today after which her heart started racing.  She denies caffeine or energy drink use.  Denies current Fioricet use.  Denies fevers, cough, shortness of breath, chest pain, nausea, vomiting, abdominal pain, diarrhea, or any urinary symptoms.  She is over 2 months postpartum and denies any pelvic pain or vaginal discharge.  She is taking nifedipine due to history of postpartum preeclampsia and denies missing any doses.  Review of Systems:  Review of Systems  All other systems reviewed and are negative.   Past Medical History:  Diagnosis Date   Gestational Diabetes    Prediabetes    Preterm labor    Delivery at 32 weeks and 34 weeks.   Seasonal allergies    Vaginal Pap smear, abnormal     Past Surgical History:  Procedure Laterality Date   BILATERAL KNEE ARTHROSCOPY     GYNECOLOGIC CRYOSURGERY  2006     reports that she has never smoked. She has never used smokeless  tobacco. She reports that she does not drink alcohol and does not use drugs.  Allergies  Allergen Reactions   Flagyl [Metronidazole] Swelling and Other (See Comments)    Tongue tingles   Pineapple Itching, Swelling and Other (See Comments)    Throat tingles   Latex Itching and Rash    Family History  Problem Relation Age of Onset   Cancer Mother    Cancer Father    Hypertension Maternal Uncle    Cancer Maternal Grandmother    Asthma Neg Hx    Birth defects Neg Hx    Diabetes Neg Hx    Heart disease Neg Hx     Prior to Admission medications   Medication Sig Start Date End Date Taking? Authorizing Provider  butalbital-acetaminophen-caffeine (FIORICET) 50-325-40 MG tablet Take 2 tablets by mouth every 6 (six) hours as needed for headache. 04/12/22  Yes Dillard, Naima, MD  ferrous sulfate 325 (65 FE) MG tablet Take 1 tablet (325 mg total) by mouth every other day. Patient taking differently: Take 325 mg by mouth daily with breakfast. 10/19/21  Yes Woodroe Mode, MD  ibuprofen (ADVIL) 600 MG tablet Take 1 tablet (600 mg total) by mouth every 6 (six) hours as needed for mild pain. 04/12/22  Yes Dillard, Naima, MD  NIFEdipine (ADALAT CC) 30 MG 24 hr tablet Take 2 tablets (60 mg total) by mouth daily. Patient taking differently: Take 30 mg by mouth in the morning. 04/13/22 07/12/22 Yes Dillard, Naima, MD  pantoprazole (PROTONIX) 40 MG tablet Take 40 mg by mouth daily before  breakfast.   Yes [provider]  TYLENOL SINUS SEVERE 5-325-200 MG TABS Take 1 tablet by mouth every 6 (six) hours as needed (for cold-like symptoms).   Yes [provider]    Physical Exam: Vitals:   06/07/22 2200 06/07/22 2245 06/07/22 2258 06/07/22 2300  BP: 124/88   126/82  Pulse: (!) 120 (!) 101 (!) 131 (!) 118  Resp: 12 19 (!) 24 15  Temp:      TempSrc:      SpO2: 99% 95% 99% 97%  Weight:      Height:        Physical Exam Vitals reviewed.  Constitutional:      General: She is not  in acute distress. HENT:     Head: Normocephalic and atraumatic.  Eyes:     Extraocular Movements: Extraocular movements intact.  Cardiovascular:     Rate and Rhythm: Regular rhythm. Tachycardia present.     Pulses: Normal pulses.  Pulmonary:     Effort: Pulmonary effort is normal. No respiratory distress.     Breath sounds: Normal breath sounds. No wheezing or rales.  Abdominal:     General: Bowel sounds are normal. There is no distension.     Palpations: Abdomen is soft.     Tenderness: There is no abdominal tenderness.  Musculoskeletal:     Cervical back: Normal range of motion.     Right lower leg: No edema.     Left lower leg: No edema.  Skin:    General: Skin is warm and dry.  Neurological:     General: No focal deficit present.     Mental Status: She is alert and oriented to person, place, and time.     Labs on Admission: I have personally reviewed following labs and imaging studies  CBC: Recent Labs  Lab 06/07/22 1446  WBC 10.8*  HGB 12.8  HCT 40.3  MCV 80.4  PLT 254   Basic Metabolic Panel: Recent Labs  Lab 06/07/22 1446  NA 137  K 3.6  CL 104  CO2 26  GLUCOSE 102*  BUN 12  CREATININE 0.70  CALCIUM 8.8*   GFR: Estimated Creatinine Clearance: 90.1 mL/min (by C-G formula based on SCr of 0.7 mg/dL). Liver Function Tests: Recent Labs  Lab 06/07/22 1446  AST 19  ALT 19  ALKPHOS 82  BILITOT 0.5  PROT 7.7  ALBUMIN 4.2   Recent Labs  Lab 06/07/22 1446  LIPASE 39   No results for input(s): "AMMONIA" in the last 168 hours. Coagulation Profile: No results for input(s): "INR", "PROTIME" in the last 168 hours. Cardiac Enzymes: No results for input(s): "CKTOTAL", "CKMB", "CKMBINDEX", "TROPONINI" in the last 168 hours. BNP (last 3 results) No results for input(s): "PROBNP" in the last 8760 hours. HbA1C: No results for input(s): "HGBA1C" in the last 72 hours. CBG: No results for input(s): "GLUCAP" in the last 168 hours. Lipid Profile: No  results for input(s): "CHOL", "HDL", "LDLCALC", "TRIG", "CHOLHDL", "LDLDIRECT" in the last 72 hours. Thyroid Function Tests: Recent Labs    06/07/22 1447  TSH 0.433  FREET4 0.73   Anemia Panel: No results for input(s): "VITAMINB12", "FOLATE", "FERRITIN", "TIBC", "IRON", "RETICCTPCT" in the last 72 hours. Urine analysis:    Component Value Date/Time   COLORURINE STRAW (A) 06/07/2022 1447   APPEARANCEUR CLEAR 06/07/2022 1447   LABSPEC 1.014 06/07/2022 1447   PHURINE 5.0 06/07/2022 1447   GLUCOSEU NEGATIVE 06/07/2022 1447   HGBUR NEGATIVE 06/07/2022 1447   BILIRUBINUR NEGATIVE  06/07/2022 [place] 07/11/2018 1531   KETONESUR NEGATIVE 06/07/2022 1447   PROTEINUR NEGATIVE 06/07/2022 1447   UROBILINOGEN 0.2 07/11/2018 1531   UROBILINOGEN 0.2 06/02/2013 1050   NITRITE NEGATIVE 06/07/2022 1447   LEUKOCYTESUR NEGATIVE 06/07/2022 1447    Radiological Exams on Admission: DG Chest Port 1 View  Result Date: 06/07/2022 CLINICAL DATA:  Nausea vomiting dizziness palpitations EXAM: PORTABLE CHEST - 1 VIEW COMPARISON:  None Available. FINDINGS: Cardiac silhouette is unremarkable. No pneumothorax or pleural effusion. The lungs are clear. The visualized skeletal structures are unremarkable. IMPRESSION: No acute cardiopulmonary process. Electronically Signed   By: Sammie Bench M.D.   On: 06/07/2022 15:15    EKG: Independently reviewed.  Sinus tachycardia, nonspecific T wave abnormality.  No prior tracing for comparison.  Assessment and Plan  Sinus tachycardia Likely from dehydration given COVID infection and poor p.o. intake/not drinking enough fluids.  Also use of over-the-counter nasal decongestant likely contributing.  Patient denies caffeine or energy drink use.  Fioricet listed in home medications but patient denies current use.  No fever or significant leukocytosis.  No obvious infectious etiology.  No obvious bleeding and hemoglobin stable.  TSH and free T4 normal.  UDS  negative.  PE less likely given no hypoxia and D-dimer negative.  Patient is on nifedipine at home and reports compliance.  Patient initially tachycardic to the 130s and received 2.5 L IV fluid boluses and IV metoprolol 5 mg in the ED. Heart rate has now improved to low 100s. -Admitted for observation -Cardiac monitoring -Continue IV fluid hydration -Continue home nifedipine  Addendum 06/08/2022 12:57 AM: Patient received a total of 3.5 L IV fluids in the ED along with IV metoprolol 5 mg.  Heart rate currently in the low 100s.  Continue IV fluid hydration.  COVID-19 positive Tachycardia improving and does not meet any other SIRS criteria.  Not endorsing any respiratory symptoms.  Not hypoxic.  Chest x-ray not suggestive of pneumonia. -No indication for treatment at this time -Isolation precautions  ?Nausea and vomiting Per ED documentation but patient denies any GI symptoms.  Lipase and LFTs normal.  No fever or significant leukocytosis.  Abdominal exam benign.  History of postpartum preeclampsia Approximately 2 months postpartum No hypertension or proteinuria at this time.  She is on nifedipine at home. -Continue nifedipine given tachycardia  GERD -Continue Protonix  DVT prophylaxis: Lovenox Code Status: Full Code (discussed with the patient) Family Communication: Husband at bedside. Level of care: Telemetry bed Admission status: It is my clinical opinion that referral for OBSERVATION is reasonable and necessary in this patient based on the above information provided. The aforementioned taken together are felt to place the patient at high risk for further clinical deterioration. However, it is anticipated that the patient may be medically stable for discharge from the hospital within 24 to 48 hours.   Shela Leff MD [facility]  If 7PM-7AM, please contact night-coverage [email or web address]  06/07/2022, 11:46 PM

## 2022-06-08 DIAGNOSIS — U071 COVID-19: Secondary | ICD-10-CM | POA: Diagnosis not present

## 2022-06-08 DIAGNOSIS — R000 Tachycardia, unspecified: Secondary | ICD-10-CM | POA: Diagnosis not present

## 2022-06-08 DIAGNOSIS — K219 Gastro-esophageal reflux disease without esophagitis: Secondary | ICD-10-CM | POA: Diagnosis not present

## 2022-06-08 LAB — HIV ANTIBODY (ROUTINE TESTING W REFLEX): HIV Screen 4th Generation wRfx: NONREACTIVE

## 2022-06-08 MED ORDER — ORAL CARE MOUTH RINSE: 15.0000 mL | OROMUCOSAL | Status: DC | PRN

## 2022-06-08 MED ORDER — ONDANSETRON HCL 4 MG PO TABS: 4.0000 mg | ORAL_TABLET | Freq: Three times a day (TID) | ORAL | 0 refills | Status: DC | PRN

## 2022-06-08 MED ORDER — ACETAMINOPHEN 325 MG PO TABS: 650.0000 mg | ORAL_TABLET | Freq: Four times a day (QID) | ORAL | Status: DC | PRN

## 2022-06-08 MED ORDER — SODIUM CHLORIDE 0.9 % IV SOLN: INTRAVENOUS | Status: AC

## 2022-06-08 MED ORDER — LORATADINE 10 MG PO TABS
10.0000 mg | ORAL_TABLET | Freq: Every day | ORAL | Status: DC
  Administered 2022-06-08: 10 mg via ORAL
  Filled 2022-06-08: qty 1

## 2022-06-08 MED ORDER — GUAIFENESIN-DM 100-10 MG/5ML PO SYRP: 10.0000 mL | ORAL_SOLUTION | ORAL | Status: DC | PRN

## 2022-06-08 MED ORDER — ONDANSETRON HCL 4 MG/2ML IJ SOLN
4.0000 mg | Freq: Four times a day (QID) | INTRAMUSCULAR | Status: DC | PRN
  Administered 2022-06-08: 4 mg via INTRAVENOUS
  Filled 2022-06-08: qty 2

## 2022-06-08 MED ORDER — NIFEDIPINE ER OSMOTIC RELEASE 30 MG PO TB24
30.0000 mg | ORAL_TABLET | Freq: Every day | ORAL | Status: DC
  Administered 2022-06-08: 30 mg via ORAL
  Filled 2022-06-08: qty 1

## 2022-06-08 MED ORDER — PHENYLEPHRINE HCL 1 % NA SOLN: 1.0000 [drp] | Freq: Four times a day (QID) | NASAL | 0 refills | Status: DC | PRN

## 2022-06-08 MED ORDER — ENOXAPARIN SODIUM 40 MG/0.4ML IJ SOSY
40.0000 mg | PREFILLED_SYRINGE | INTRAMUSCULAR | Status: DC
  Administered 2022-06-08: 40 mg via SUBCUTANEOUS
  Filled 2022-06-08: qty 0.4

## 2022-06-08 MED ORDER — PANTOPRAZOLE SODIUM 40 MG PO TBEC
40.0000 mg | DELAYED_RELEASE_TABLET | Freq: Every day | ORAL | Status: DC
  Administered 2022-06-08: 40 mg via ORAL
  Filled 2022-06-08: qty 1

## 2022-06-08 MED ORDER — GUAIFENESIN-DM 100-10 MG/5ML PO SYRP: 10.0000 mL | ORAL_SOLUTION | ORAL | 0 refills | Status: DC | PRN

## 2022-06-08 MED ORDER — ACETAMINOPHEN 650 MG RE SUPP: 650.0000 mg | Freq: Four times a day (QID) | RECTAL | Status: DC | PRN

## 2022-06-08 MED ORDER — PHENYLEPHRINE HCL 1 % NA SOLN
1.0000 [drp] | Freq: Four times a day (QID) | NASAL | Status: DC | PRN
  Administered 2022-06-08: 1 [drp] via NASAL
  Filled 2022-06-08: qty 0.1

## 2022-06-08 MED ORDER — LORATADINE 10 MG PO TABS: 10.0000 mg | ORAL_TABLET | Freq: Every day | ORAL | 0 refills | Status: DC

## 2022-06-08 NOTE — Progress Notes (Signed)
  Transition of Care [facility]) Screening Note   Patient Details  Name: Sherri Castaneda Date of Birth: 02-Dec-1985   Transition of Care [facility]) CM/SW Contact:    Henrietta Dine, RN Phone Number: 06/08/2022, 9:36 AM    Transition of Care Department [facility]) has reviewed patient and no TOC needs have been identified at this time. We will continue to monitor patient advancement through interdisciplinary progression rounds. If new patient transition needs arise, please place a TOC consult.

## 2022-06-08 NOTE — Discharge Summary (Signed)
Physician Discharge Summary   Patient: Sherri Castaneda MRN: [id number] DOB: 1985-06-12  Admit date:     06/07/2022  Discharge date: 2/ 10/2022  Discharge Physician: Hosie Poisson   PCP: Andria Frames, PA-C   Recommendations at discharge:  Please follow up with PCP In one week.  Discharge Diagnoses: Principal Problem:   Sinus tachycardia Active Problems:   Pre-eclampsia, severe, postpartum condition   Lab test positive for detection of COVID-19 virus   GERD (gastroesophageal reflux disease)    Hospital Course:  Sherri Castaneda is a 37 y.o. female with medical history significant of gestational diabetes, postpartum preeclampsia on nifedipine, approximately 2 months postpartum, GERD presented to ED with sinus congestion, nausea, vomiting, dizziness, and palpitations. Chest x-ray showing no acute cardiopulmonary process .  Assessment and Plan:   Assessment and Plan   Sinus tachycardia Likely from dehydration given COVID infection and poor p.o. intake/not drinking enough fluids.  Also use of over-the-counter nasal decongestant likely contributing.  Patient denies caffeine or energy drink use.  Fioricet listed in home medications but patient denies current use.  No fever or significant leukocytosis.  No obvious infectious etiology.  No obvious bleeding and hemoglobin stable.  TSH and free T4 normal.  UDS negative.  PE less likely given no hypoxia and D-dimer negative.  Patient is on nifedipine at home and reports compliance.  She received about 4 lit of fluids since admission.      COVID-19 positive Tachycardia improving and does not meet any other SIRS criteria.  Not endorsing any respiratory symptoms.  Not hypoxic.  Chest x-ray not suggestive of pneumonia. -No indication for treatment at this time -Isolation precautions   ?Nausea and vomiting Per ED documentation but patient denies any GI symptoms.  Lipase and LFTs normal.  No fever or significant leukocytosis.  Abdominal exam  benign. Resolved.      History of postpartum preeclampsia Approximately 2 months postpartum No hypertension or proteinuria at this time.  She is on nifedipine at home. -Continue nifedipine given tachycardia   GERD -Continue Protonix       Consultants: none.  Procedures performed: none.   Disposition: Home Diet recommendation:  Discharge Diet Orders (From admission, onward)     Start     Ordered   06/08/22 0000  Diet - low sodium heart healthy        06/08/22 1259           Regular diet DISCHARGE MEDICATION: Allergies as of 06/08/2022       Reactions   Flagyl [metronidazole] Swelling, Other (See Comments)   Tongue tingles   Pineapple Itching, Swelling, Other (See Comments)   Throat tingles   Latex Itching, Rash        Medication List     STOP taking these medications    ibuprofen 600 MG tablet Commonly known as: ADVIL       TAKE these medications    butalbital-acetaminophen-caffeine 50-325-40 MG tablet Commonly known as: FIORICET Take 2 tablets by mouth every 6 (six) hours as needed for headache.   ferrous sulfate 325 (65 FE) MG tablet Take 1 tablet (325 mg total) by mouth every other day. What changed: when to take this   guaiFENesin-dextromethorphan 100-10 MG/5ML syrup Commonly known as: ROBITUSSIN DM Take 10 mLs by mouth every 4 (four) hours as needed for cough.   loratadine 10 MG tablet Commonly known as: CLARITIN Take 1 tablet (10 mg total) by mouth daily. Start taking on: June 09, 2022  NIFEdipine 30 MG 24 hr tablet Commonly known as: ADALAT CC Take 2 tablets (60 mg total) by mouth daily. What changed:  how much to take when to take this   ondansetron 4 MG tablet Commonly known as: Zofran Take 1 tablet (4 mg total) by mouth every 8 (eight) hours as needed for nausea or vomiting.   pantoprazole 40 MG tablet Commonly known as: PROTONIX Take 40 mg by mouth daily before breakfast.   phenylephrine 1 % nasal spray Commonly  known as: NEO-SYNEPHRINE Place 1 drop into both nostrils every 6 (six) hours as needed for congestion.   Tylenol Sinus Severe 5-325-200 MG Tabs Generic drug: Phenylephrine-APAP-guaiFENesin Take 1 tablet by mouth every 6 (six) hours as needed (for cold-like symptoms).        Follow-up Information     Park Meo T, PA-C. Call .   Specialty: Physician Assistant Contact information: [place] [address] [facility] to .   Specialty: Emergency Medicine Why: As needed, If symptoms worsen Contact information: [place] [id number] [place] [address] [phone]               Discharge Exam: Filed Weights   06/07/22 1522  Weight: 80 kg   General exam: Appears calm and comfortable  Respiratory system: Clear to auscultation. Respiratory effort normal. Cardiovascular system: S1 & S2 heard, RRR. No JVD,  Gastrointestinal system: Abdomen is nondistended, soft and nontender.  Central nervous system: Alert and oriented. No focal neurological deficits. Extremities: Symmetric 5 x 5 power. Skin: No rashes, Psychiatry: Mood & affect appropriate.    Condition at discharge: fair  The results of significant diagnostics from this hospitalization (including imaging, microbiology, ancillary and laboratory) are listed below for reference.   Imaging Studies: DG Chest Port 1 View  Result Date: 06/07/2022 CLINICAL DATA:  Nausea vomiting dizziness palpitations EXAM: PORTABLE CHEST - 1 VIEW COMPARISON:  None Available. FINDINGS: Cardiac silhouette is unremarkable. No pneumothorax or pleural effusion. The lungs are clear. The visualized skeletal structures are unremarkable. IMPRESSION: No acute cardiopulmonary process. Electronically Signed   By: Sammie Bench M.D.   On: 06/07/2022 15:15    Microbiology: Results for orders placed or performed during the  hospital encounter of 06/07/22  Resp panel by RT-PCR (RSV, Flu A&B, Covid) Anterior Nasal Swab     Status: Abnormal   Collection Time: 06/07/22  3:38 PM   Specimen: Anterior Nasal Swab  Result Value Ref Range Status   SARS Coronavirus 2 by RT PCR POSITIVE (A) NEGATIVE Final    Comment: (NOTE) SARS-CoV-2 target nucleic acids are DETECTED.  The SARS-CoV-2 RNA is generally detectable in upper respiratory specimens during the acute phase of infection. Positive results are indicative of the presence of the identified virus, but do not rule out bacterial infection or co-infection with other pathogens not detected by the test. Clinical correlation with patient history and other diagnostic information is necessary to determine patient infection status. The expected result is Negative.  Fact Sheet for Patients: EntrepreneurPulse.com.au  Fact Sheet for Healthcare Providers: IncredibleEmployment.be  This test is not yet approved or cleared by the [place] FDA and  has been authorized for detection and/or diagnosis of SARS-CoV-2 by FDA under an Emergency Use Authorization (EUA).  This EUA will remain in effect (meaning this test can be used) for the duration of  the COVID-19 declaration under Section  564(b)(1) of the A ct, 21 U.S.C. section 360bbb-3(b)(1), unless the authorization is terminated or revoked sooner.     Influenza A by PCR NEGATIVE NEGATIVE Final   Influenza B by PCR NEGATIVE NEGATIVE Final    Comment: (NOTE) The Xpert Xpress SARS-CoV-2/FLU/RSV plus assay is intended as an aid in the diagnosis of influenza from Nasopharyngeal swab specimens and should not be used as a sole basis for treatment. Nasal washings and aspirates are unacceptable for Xpert Xpress SARS-CoV-2/FLU/RSV testing.  Fact Sheet for Patients: EntrepreneurPulse.com.au  Fact Sheet for Healthcare  Providers: IncredibleEmployment.be  This test is not yet approved or cleared by the [place] FDA and has been authorized for detection and/or diagnosis of SARS-CoV-2 by FDA under an Emergency Use Authorization (EUA). This EUA will remain in effect (meaning this test can be used) for the duration of the COVID-19 declaration under Section 564(b)(1) of the Act, 21 U.S.C. section 360bbb-3(b)(1), unless the authorization is terminated or revoked.     Resp Syncytial Virus by PCR NEGATIVE NEGATIVE Final    Comment: (NOTE) Fact Sheet for Patients: EntrepreneurPulse.com.au  Fact Sheet for Healthcare Providers: IncredibleEmployment.be  This test is not yet approved or cleared by the [place] FDA and has been authorized for detection and/or diagnosis of SARS-CoV-2 by FDA under an Emergency Use Authorization (EUA). This EUA will remain in effect (meaning this test can be used) for the duration of the COVID-19 declaration under Section 564(b)(1) of the Act, 21 U.S.C. section 360bbb-3(b)(1), unless the authorization is terminated or revoked.  Performed at [facility], [place] [address] [place],  [address]     Labs: CBC: Recent Labs  Lab 06/07/22 1446  WBC 10.8*  HGB 12.8  HCT 40.3  MCV 80.4  PLT 202   Basic Metabolic Panel: Recent Labs  Lab 06/07/22 1446  NA 137  K 3.6  CL 104  CO2 26  GLUCOSE 102*  BUN 12  CREATININE 0.70  CALCIUM 8.8*   Liver Function Tests: Recent Labs  Lab 06/07/22 1446  AST 19  ALT 19  ALKPHOS 82  BILITOT 0.5  PROT 7.7  ALBUMIN 4.2   CBG: No results for input(s): "GLUCAP" in the last 168 hours.  Discharge time spent: 38  minutes.   Signed: Hosie Poisson, MD [facility] 06/08/2022

## 2022-06-08 NOTE — Progress Notes (Signed)
AVS and discharge instructions reviewed with patient. All of the patient's questions/requests were answered. Patient's husband is transportation to home.

## 2022-06-08 NOTE — ED Notes (Signed)
ED TO INPATIENT HANDOFF REPORT  ED Nurse Name and Phone #: Sylvan Cheese Name/Age/Gender Sherri Castaneda 37 y.o. female Room/Bed: WA10/WA10  Code Status   Code Status: Full Code  Home/SNF/Other Home Patient oriented to: self, place, time, and situation Is this baseline? Yes   Triage Complete: Triage complete  Chief Complaint Sinus tachycardia [R00.0]  Triage Note BIB EMS from Dr office with c/o N/V, dizziness, palpitations. Pt is 2 months post partum. 125 HR after 500 cc bolus 18 lac   Allergies Allergies  Allergen Reactions   Flagyl [Metronidazole] Swelling and Other (See Comments)    Tongue tingles   Pineapple Itching, Swelling and Other (See Comments)    Throat tingles   Latex Itching and Rash    Level of Care/Admitting Diagnosis ED Disposition     ED Disposition  Admit   Condition  --   Comment  Hospital Area: [place] [[id number]]  Level of Care: Telemetry [5]  Admit to tele based on following criteria: Complex arrhythmia (Bradycardia/Tachycardia)  May place patient in observation at [facility] or [place] if equivalent level of care is available:: Yes  Covid Evaluation: Asymptomatic - no recent exposure (last 10 days) testing not required  Diagnosis: Sinus tachycardia [350093]  Admitting Physician: Shela Leff [[id number]]  Attending Physician: Shela Leff [[id number]]          B Medical/Surgery History Past Medical History:  Diagnosis Date   Gestational Diabetes    Prediabetes    Preterm labor    Delivery at 32 weeks and 34 weeks.   Seasonal allergies    Vaginal Pap smear, abnormal    Past Surgical History:  Procedure Laterality Date   BILATERAL KNEE ARTHROSCOPY     GYNECOLOGIC CRYOSURGERY  2006     A IV Location/Drains/Wounds Patient Lines/Drains/Airways Status     Active Line/Drains/Airways     Name Placement date Placement time Site Days   Peripheral IV 06/07/22 18 G Left Antecubital 06/07/22  1432   Antecubital  1            Intake/Output Last 24 hours  Intake/Output Summary (Last 24 hours) at 06/08/2022 0109 Last data filed at 06/07/2022 2151 Gross per 24 hour  Intake 2500 ml  Output --  Net 2500 ml    Labs/Imaging Results for orders placed or performed during the hospital encounter of 06/07/22 (from the past 48 hour(s))  Lipase, blood     Status: None   Collection Time: 06/07/22  2:46 PM  Result Value Ref Range   Lipase 39 11 - 51 U/L    Comment: Performed at [facility], [place] [address] [place], [place] [address]  Comprehensive metabolic panel     Status: Abnormal   Collection Time: 06/07/22  2:46 PM  Result Value Ref Range   Sodium 137 135 - 145 mmol/L   Potassium 3.6 3.5 - 5.1 mmol/L   Chloride 104 98 - 111 mmol/L   CO2 26 22 - 32 mmol/L   Glucose, Bld 102 (H) 70 - 99 mg/dL    Comment: Glucose reference range applies only to samples taken after fasting for at least 8 hours.   BUN 12 6 - 20 mg/dL   Creatinine, Ser 0.70 0.44 - 1.00 mg/dL   Calcium 8.8 (L) 8.9 - 10.3 mg/dL   Total Protein 7.7 6.5 - 8.1 g/dL   Albumin 4.2 3.5 - 5.0 g/dL   AST 19 15 - 41 U/L   ALT 19  0 - 44 U/L   Alkaline Phosphatase 82 38 - 126 U/L   Total Bilirubin 0.5 0.3 - 1.2 mg/dL   GFR, Estimated >60 >60 mL/min    Comment: (NOTE) Calculated using the CKD-EPI Creatinine Equation (2021)    Anion gap 7 5 - 15    Comment: Performed at [facility], [place] [address], [place], [place] [address]  CBC     Status: Abnormal   Collection Time: 06/07/22  2:46 PM  Result Value Ref Range   WBC 10.8 (H) 4.0 - 10.5 K/uL   RBC 5.01 3.87 - 5.11 MIL/uL   Hemoglobin 12.8 12.0 - 15.0 g/dL   HCT 40.3 36.0 - 46.0 %   MCV 80.4 80.0 - 100.0 fL   MCH 25.5 (L) 26.0 - 34.0 pg   MCHC 31.8 30.0 - 36.0 g/dL   RDW 13.9 11.5 - 15.5 %   Platelets 228 150 - 400 K/uL   nRBC 0.0 0.0 - 0.2 %    Comment: Performed at [facility], [place] [address] [place],  [place] [address]  Troponin I (High Sensitivity)     Status: None   Collection Time: 06/07/22  2:46 PM  Result Value Ref Range   Troponin I (High Sensitivity) 3 <18 ng/L    Comment: (NOTE) Elevated high sensitivity troponin I (hsTnI) values and significant  changes across serial measurements may suggest ACS but many other  chronic and acute conditions are known to elevate hsTnI results.  Refer to the "Links" section for chest pain algorithms and additional  guidance. Performed at [facility], [place] [address] [place], [place] [address]   D-dimer, quantitative     Status: None   Collection Time: 06/07/22  2:46 PM  Result Value Ref Range   D-Dimer, Quant 0.29 0.00 - 0.50 ug/mL-FEU    Comment: (NOTE) At the manufacturer cut-off value of 0.5 g/mL FEU, this assay has a negative predictive value of 95-100%.This assay is intended for use in conjunction with a clinical pretest probability (PTP) assessment model to exclude pulmonary embolism (PE) and deep venous thrombosis (DVT) in outpatients suspected of PE or DVT. Results should be correlated with clinical presentation. Performed at [facility], [place] [address], [place], [place] [address]   Urinalysis, Routine w reflex microscopic -Urine, Clean Catch     Status: Abnormal   Collection Time: 06/07/22  2:47 PM  Result Value Ref Range   Color, Urine STRAW (A) YELLOW   APPearance CLEAR CLEAR   Specific Gravity, Urine 1.014 1.005 - 1.030   pH 5.0 5.0 - 8.0   Glucose, UA NEGATIVE NEGATIVE mg/dL   Hgb urine dipstick NEGATIVE NEGATIVE   Bilirubin Urine NEGATIVE NEGATIVE   Ketones, ur NEGATIVE NEGATIVE mg/dL   Protein, ur NEGATIVE NEGATIVE mg/dL   Nitrite NEGATIVE NEGATIVE   Leukocytes,Ua NEGATIVE NEGATIVE   RBC / HPF 0-5 0 - 5 RBC/hpf   WBC, UA 0-5 0 - 5 WBC/hpf   Bacteria, UA NONE SEEN NONE SEEN   Squamous Epithelial / HPF 0-5 0 - 5 /HPF   Mucus PRESENT     Comment: Performed at [facility], [place] [address] [place], [place] [address]  TSH     Status: None   Collection Time: 06/07/22  2:47 PM  Result Value Ref Range   TSH 0.433 0.350 - 4.500 uIU/mL    Comment: Performed by a 3rd Generation assay with a functional sensitivity of <=0.01 uIU/mL. Performed  at [facility], [place] [address] [place], [place] [address]   T4, free     Status: None   Collection Time: 06/07/22  2:47 PM  Result Value Ref Range   Free T4 0.73 0.61 - 1.12 ng/dL    Comment: (NOTE) Biotin ingestion may interfere with free T4 tests. If the results are inconsistent with the TSH level, previous test results, or the clinical presentation, then consider biotin interference. If needed, order repeat testing after stopping biotin. Performed at [facility], [place] [address] [place], [place] [address]   Resp panel by RT-PCR (RSV, Flu A&B, Covid) Anterior Nasal Swab     Status: Abnormal   Collection Time: 06/07/22  3:38 PM   Specimen: Anterior Nasal Swab  Result Value Ref Range   SARS Coronavirus 2 by RT PCR POSITIVE (A) NEGATIVE    Comment: (NOTE) SARS-CoV-2 target nucleic acids are DETECTED.  The SARS-CoV-2 RNA is generally detectable in upper respiratory specimens during the acute phase of infection. Positive results are indicative of the presence of the identified virus, but do not rule out bacterial infection or co-infection with other pathogens not detected by the test. Clinical correlation with patient history and other diagnostic information is necessary to determine patient infection status. The expected result is Negative.  Fact Sheet for Patients: EntrepreneurPulse.com.au  Fact Sheet for Healthcare Providers: IncredibleEmployment.be  This test is not yet approved or cleared by the [place] FDA and  has been authorized for detection and/or diagnosis of SARS-CoV-2 by FDA under an Emergency Use Authorization (EUA).  This  EUA will remain in effect (meaning this test can be used) for the duration of  the COVID-19 declaration under Section 564(b)(1) of the A ct, 21 U.S.C. section 360bbb-3(b)(1), unless the authorization is terminated or revoked sooner.     Influenza A by PCR NEGATIVE NEGATIVE   Influenza B by PCR NEGATIVE NEGATIVE    Comment: (NOTE) The Xpert Xpress SARS-CoV-2/FLU/RSV plus assay is intended as an aid in the diagnosis of influenza from Nasopharyngeal swab specimens and should not be used as a sole basis for treatment. Nasal washings and aspirates are unacceptable for Xpert Xpress SARS-CoV-2/FLU/RSV testing.  Fact Sheet for Patients: EntrepreneurPulse.com.au  Fact Sheet for Healthcare Providers: IncredibleEmployment.be  This test is not yet approved or cleared by the [place] FDA and has been authorized for detection and/or diagnosis of SARS-CoV-2 by FDA under an Emergency Use Authorization (EUA). This EUA will remain in effect (meaning this test can be used) for the duration of the COVID-19 declaration under Section 564(b)(1) of the Act, 21 U.S.C. section 360bbb-3(b)(1), unless the authorization is terminated or revoked.     Resp Syncytial Virus by PCR NEGATIVE NEGATIVE    Comment: (NOTE) Fact Sheet for Patients: EntrepreneurPulse.com.au  Fact Sheet for Healthcare Providers: IncredibleEmployment.be  This test is not yet approved or cleared by the [place] FDA and has been authorized for detection and/or diagnosis of SARS-CoV-2 by FDA under an Emergency Use Authorization (EUA). This EUA will remain in effect (meaning this test can be used) for the duration of the COVID-19 declaration under Section 564(b)(1) of the Act, 21 U.S.C. section 360bbb-3(b)(1), unless the authorization is terminated or revoked.  Performed at [facility], [place] [address], [place], [place] [address]    Troponin I (High Sensitivity)     Status: None   Collection Time: 06/07/22  5:13 PM  Result Value Ref Range   Troponin I (High Sensitivity) 3 <  18 ng/L    Comment: (NOTE) Elevated high sensitivity troponin I (hsTnI) values and significant  changes across serial measurements may suggest ACS but many other  chronic and acute conditions are known to elevate hsTnI results.  Refer to the "Links" section for chest pain algorithms and additional  guidance. Performed at [facility], [place] [address], [place], [place] [address]   hCG, serum, qualitative     Status: None   Collection Time: 06/07/22  5:13 PM  Result Value Ref Range   Preg, Serum NEGATIVE NEGATIVE    Comment:        THE SENSITIVITY OF THIS METHODOLOGY IS >10 mIU/mL. Performed at [facility], [place] [address] [place], [place] [address]   Rapid urine drug screen (hospital performed)     Status: None   Collection Time: 06/07/22  5:37 PM  Result Value Ref Range   Opiates NONE DETECTED NONE DETECTED   Cocaine NONE DETECTED NONE DETECTED   Benzodiazepines NONE DETECTED NONE DETECTED   Amphetamines NONE DETECTED NONE DETECTED   Tetrahydrocannabinol NONE DETECTED NONE DETECTED   Barbiturates NONE DETECTED NONE DETECTED    Comment: (NOTE) DRUG SCREEN FOR MEDICAL PURPOSES ONLY.  IF CONFIRMATION IS NEEDED FOR ANY PURPOSE, NOTIFY LAB WITHIN 5 DAYS.  LOWEST DETECTABLE LIMITS FOR URINE DRUG SCREEN Drug Class                     Cutoff (ng/mL) Amphetamine and metabolites    1000 Barbiturate and metabolites    200 Benzodiazepine                 200 Opiates and metabolites        300 Cocaine and metabolites        300 THC                            50 Performed at [facility], [place] [address], [place],  [address]    DG Chest Port 1 View  Result Date: 06/07/2022 CLINICAL DATA:  Nausea vomiting dizziness palpitations EXAM: PORTABLE CHEST - 1 VIEW COMPARISON:  None  Available. FINDINGS: Cardiac silhouette is unremarkable. No pneumothorax or pleural effusion. The lungs are clear. The visualized skeletal structures are unremarkable. IMPRESSION: No acute cardiopulmonary process. Electronically Signed   By: Sammie Bench M.D.   On: 06/07/2022 15:15    Pending Labs Unresulted Labs (From admission, onward)     Start     Ordered   06/08/22 0043  HIV Antibody (routine testing w rflx)  (HIV Antibody (Routine testing w reflex) panel)  Once,   R        06/08/22 0043            Vitals/Pain Today's Vitals   06/07/22 2300 06/07/22 2330 06/08/22 0000 06/08/22 0010  BP: 126/82 125/82 129/84   Pulse: (!) 118 (!) 111 87 87  Resp: 15 17 (!) 9 17  Temp:      TempSrc:      SpO2: 97% 96% 98% 96%  Weight:      Height:      PainSc:        Isolation Precautions Airborne and Contact precautions  Medications Medications  NIFEdipine (PROCARDIA-XL/NIFEDICAL-XL) 24 hr tablet 30 mg (has no administration in time range)  pantoprazole (PROTONIX) EC tablet 40 mg (has no administration in time range)  enoxaparin (LOVENOX) injection 40 mg (has no administration in  time range)  acetaminophen (TYLENOL) tablet 650 mg (has no administration in time range)    Or  acetaminophen (TYLENOL) suppository 650 mg (has no administration in time range)  0.9 %  sodium chloride infusion (has no administration in time range)  sodium chloride 0.9 % bolus 1,000 mL (0 mLs Intravenous Stopped 06/07/22 1801)  sodium chloride 0.9 % bolus 1,000 mL (0 mLs Intravenous Stopped 06/07/22 2013)  metoprolol tartrate (LOPRESSOR) injection 5 mg (5 mg Intravenous Given 06/07/22 1807)  lactated ringers bolus 500 mL (0 mLs Intravenous Stopped 06/07/22 2151)  lactated ringers bolus 1,000 mL (1,000 mLs Intravenous New Bag/Given 06/08/22 0003)    Mobility walks     Focused Assessments Cardiac Assessment Handoff:  Cardiac Rhythm: Sinus tachycardia No results found for: "CKTOTAL", "CKMB", "CKMBINDEX",  "TROPONINI" Lab Results  Component Value Date   DDIMER 0.29 06/07/2022   Does the Patient currently have chest pain? No    R Recommendations: See Admitting Provider Note  Report given to:   Additional Notes: Pt positive for COVID, pt ambulates, AAOx4.

## 2022-06-08 NOTE — Plan of Care (Signed)

## 2022-06-25 ENCOUNTER — Telehealth: Payer: 59 | Admitting: Nurse Practitioner

## 2022-06-25 ENCOUNTER — Ambulatory Visit
Admission: EM | Admit: 2022-06-25 | Discharge: 2022-06-25 | Disposition: A | Discharge status: Home / Self Care | Payer: 59 | Attending: Internal Medicine | Admitting: Internal Medicine

## 2022-06-25 ENCOUNTER — Ambulatory Visit: Admit: 2022-06-25 | Payer: 59

## 2022-06-25 DIAGNOSIS — R000 Tachycardia, unspecified: Secondary | ICD-10-CM

## 2022-06-25 DIAGNOSIS — H5789 Other specified disorders of eye and adnexa: Secondary | ICD-10-CM

## 2022-06-25 DIAGNOSIS — R519 Headache, unspecified: Secondary | ICD-10-CM | POA: Diagnosis not present

## 2022-06-25 MED ORDER — POLYMYXIN B-TRIMETHOPRIM 10000-0.1 UNIT/ML-% OP SOLN: 2.0000 [drp] | OPHTHALMIC | 0 refills | Status: DC

## 2022-06-25 NOTE — Discharge Instructions (Addendum)
Please follow-up with your family doctor at scheduled appointment on Monday.

## 2022-06-25 NOTE — ED Triage Notes (Signed)
Pt c/o headache x 5 days since has a 60 month old and had preeclampsia. This has been an ongoing problem since the pregnancy per pt.

## 2022-06-25 NOTE — Progress Notes (Signed)
E-Visit for Sherri Castaneda   We are sorry that you are not feeling well.  Here is how we plan to help!  Based on what you have shared with me it looks like you have conjunctivitis.  Conjunctivitis is a common inflammatory or infectious condition of the eye that is often referred to as "pink eye".  In most cases it is contagious (viral or bacterial). However, not all conjunctivitis requires antibiotics (ex. Allergic).  We have made appropriate suggestions for you based upon your presentation.  I have prescribed Polytrim Ophthalmic drops 1-2 drops 4 times a day times 5 days  Pink eye can be highly contagious.  It is typically spread through direct contact with secretions, or contaminated objects or surfaces that one may have touched.  Strict handwashing is suggested with soap and water is urged.  If not available, use alcohol based had sanitizer.  Avoid unnecessary touching of the eye.  If you wear contact lenses, you will need to refrain from wearing them until you see no white discharge from the eye for at least 24 hours after being on medication.  You should see symptom improvement in 1-2 days after starting the medication regimen.  Call us if symptoms are not improved in 1-2 days.  Home Care: Wash your hands often! Do not wear your contacts until you complete your treatment plan. Avoid sharing towels, bed linen, personal items with a person who has pink eye. See attention for anyone in your home with similar symptoms.  Get Help Right Away If: Your symptoms do not improve. You develop blurred or loss of vision. Your symptoms worsen (increased discharge, pain or redness)   Thank you for choosing an e-visit.  Your e-visit answers were reviewed by a board certified advanced clinical practitioner to complete your personal care plan. Depending upon the condition, your plan could have included both over the counter or prescription medications.  Please review your pharmacy choice. Make sure the  pharmacy is open so you can pick up prescription now. If there is a problem, you may contact your provider through [employer] and have the prescription routed to another pharmacy.  Your safety is important to [place]. If you have drug allergies check your prescription carefully.   For the next 24 hours you can use MyChart to ask questions about today's visit, request a non-urgent call back, or ask for a work or school excuse. You will get an email in the next two days asking about your experience. I hope that your e-visit has been valuable and will speed your recovery.   Mary-Margaret Hassell Done, FNP   5-10 minutes spent reviewing and documenting in chart.

## 2022-06-25 NOTE — ED Provider Notes (Addendum)
[name]    CSN: [id number] Arrival date & time: 06/25/22  1543      History   Chief Complaint Chief Complaint  Patient presents with   Headache    HPI Sherri Castaneda is a 37 y.o. female.   Patient presents for further evaluation of intermittent headaches that have been present since giving birth to her child approximately 3 months ago.  Patient states they are occurring approximately twice weekly but over the past week they have been almost daily.  She denies any current headache at this time as she took ibuprofen prior to arrival with improvement in headache.  Headaches are typically on the left side of head and radiates around to left ear but she reports that they are sometimes present to the occipital portion of the head and radiate down neck.  She does have some dizziness and blurred vision when headaches occur.  Denies nausea and vomiting.  Denies any loss of consciousness, numbness, tingling.  Denies history of migraine headaches.  Was prescribed ibuprofen and Fioricet to take for headaches.  Reports Fioricet typically works better for the headaches.  Denies recent falls or head injuries.  She does take nifedipine 30 mg daily for postpartum preeclampsia that was diagnosed after giving birth.  States that she originally took nifedipine 60 mg but the dose was decreased at PCP visit 1 month ago given that she was not tolerating high dosage.  States that the headache started while giving birth.  She has notified her family doctor of intermittent headaches and has appointment in 2 days with family doctor.  Patient does have some mild tachycardia today.  She denies current chest pain, palpitations, shortness of breath.  Reports that she tested positive for COVID a few weeks prior and was having palpitations at that time that have now resolved.   Headache   Past Medical History:  Diagnosis Date   Gestational Diabetes    Prediabetes    Preterm labor    Delivery at 32 weeks  and 34 weeks.   Seasonal allergies    Vaginal Pap smear, abnormal     Patient Active Problem List   Diagnosis Date Noted   Sinus tachycardia 06/08/2022   Lab test positive for detection of COVID-19 virus 06/08/2022   GERD (gastroesophageal reflux disease) 06/08/2022   Pre-eclampsia, severe, postpartum condition 04/09/2022   Preeclampsia, severe 04/09/2022   Labor and delivery, indication for care 03/23/2022   Prediabetes 09/22/2021   AMA (advanced maternal age) multigravida 35+ 09/21/2021   Supervision of high-risk pregnancy 09/08/2021   Previous two preterm deliveries followed by two term deliveries, antepartum 03/18/2013    Past Surgical History:  Procedure Laterality Date   BILATERAL KNEE ARTHROSCOPY     GYNECOLOGIC CRYOSURGERY  2006    OB History     Gravida  5   Para  5   Term  3   Preterm  2   AB      Living  5      SAB      IAB      Ectopic      Multiple  0   Live Births  5            Home Medications    Prior to Admission medications   Medication Sig Start Date End Date Taking? Authorizing Provider  butalbital-acetaminophen-caffeine (FIORICET) 50-325-40 MG tablet Take 2 tablets by mouth every 6 (six) hours as needed for headache. 04/12/22   Crawford Givens, MD  ferrous sulfate 325 (65 FE) MG tablet Take 1 tablet (325 mg total) by mouth every other day. Patient taking differently: Take 325 mg by mouth daily with breakfast. 10/19/21   Woodroe Mode, MD  guaiFENesin-dextromethorphan (ROBITUSSIN DM) 100-10 MG/5ML syrup Take 10 mLs by mouth every 4 (four) hours as needed for cough. 06/08/22   Hosie Poisson, MD  loratadine (CLARITIN) 10 MG tablet Take 1 tablet (10 mg total) by mouth daily. 06/09/22   Hosie Poisson, MD  NIFEdipine (ADALAT CC) 30 MG 24 hr tablet Take 2 tablets (60 mg total) by mouth daily. Patient taking differently: Take 30 mg by mouth in the morning. 04/13/22 07/12/22  Crawford Givens, MD  ondansetron (ZOFRAN) 4 MG tablet Take 1 tablet  (4 mg total) by mouth every 8 (eight) hours as needed for nausea or vomiting. 06/08/22   Hosie Poisson, MD  pantoprazole (PROTONIX) 40 MG tablet Take 40 mg by mouth daily before breakfast.    [provider]  phenylephrine (NEO-SYNEPHRINE) 1 % nasal spray Place 1 drop into both nostrils every 6 (six) hours as needed for congestion. 06/08/22   Hosie Poisson, MD  trimethoprim-polymyxin b (POLYTRIM) ophthalmic solution Place 2 drops into both eyes every 4 (four) hours. 06/25/22   Hassell Done Mary-Margaret, FNP  TYLENOL SINUS SEVERE 5-325-200 MG TABS Take 1 tablet by mouth every 6 (six) hours as needed (for cold-like symptoms).    [provider]    Family History Family History  Problem Relation Age of Onset   Cancer Mother    Cancer Father    Hypertension Maternal Uncle    Cancer Maternal Grandmother    Asthma Neg Hx    Birth defects Neg Hx    Diabetes Neg Hx    Heart disease Neg Hx     Social History Social History   Tobacco Use   Smoking status: Never   Smokeless tobacco: Never  Vaping Use   Vaping Use: Never used  Substance Use Topics   Alcohol use: No   Drug use: No     Allergies   Flagyl [metronidazole], Pineapple, and Latex   Review of Systems Review of Systems Per HPI  Physical Exam Triage Vital Signs ED Triage Vitals [06/25/22 1547]  Enc Vitals Group     BP (!) 152/106     Pulse Rate (!) 110     Resp 16     Temp 98 F (36.7 C)     Temp Source Oral     SpO2 98 %     Weight      Height      Head Circumference      Peak Flow      Pain Score 7     Pain Loc      Pain Edu?      Excl. in [place]?    No data found.  Updated Vital Signs BP (!) 136/93 (BP Location: Left Arm)   Pulse 96   Temp 98 F (36.7 C) (Oral)   Resp 16   SpO2 98%   Breastfeeding Yes   Visual Acuity Right Eye Distance:   Left Eye Distance:   Bilateral Distance:    Right Eye Near:   Left Eye Near:    Bilateral Near:     Physical Exam Constitutional:      General:  She is not in acute distress.    Appearance: Normal appearance. She is not toxic-appearing or diaphoretic.  HENT:     Head: Normocephalic and atraumatic.  Eyes:     Extraocular Movements: Extraocular movements intact.     Conjunctiva/sclera: Conjunctivae normal.     Pupils: Pupils are equal, round, and reactive to light.  Cardiovascular:     Rate and Rhythm: Normal rate and regular rhythm.     Pulses: Normal pulses.     Heart sounds: Normal heart sounds.  Pulmonary:     Effort: Pulmonary effort is normal. No respiratory distress.     Breath sounds: Normal breath sounds.  Neurological:     General: No focal deficit present.     Mental Status: She is alert and oriented to person, place, and time. Mental status is at baseline.     Cranial Nerves: Cranial nerves 2-12 are intact.     Sensory: Sensation is intact.     Motor: Motor function is intact.     Coordination: Coordination is intact.     Gait: Gait is intact.  Psychiatric:        Mood and Affect: Mood normal.        Behavior: Behavior normal.        Thought Content: Thought content normal.        Judgment: Judgment normal.      UC Treatments / Results  Labs (all labs ordered are listed, but only abnormal results are displayed) Labs Reviewed - No data to display  EKG   Radiology No results found.  Procedures Procedures (including critical care time)  Medications Ordered in UC Medications - No data to display  Initial Impression / Assessment and Plan / UC Course  I have reviewed the triage vital signs and the nursing notes.  Pertinent labs & imaging results that were available during my care of the patient were reviewed by me and considered in my medical decision making (see chart for details).     Patient reporting intermittent headaches since giving birth 3 months ago that have seemed to worsen and become more frequent.  She took ibuprofen for pain with improvement and resolution of headache.  Neuroexam is  normal and vital signs are stable so do not think that emergent evaluation or imaging of the head is necessary at this time.  Patient was mildly tachycardic on initial triage but reports that she is very anxious given that she has had frequent hospital visits since giving birth.  EKG was completed that showed sinus rhythm with sinus arrhythmia.  No acute abnormality.  This could be normal variant in patient's age group.  BMP and CBC to rule any worrisome etiologies.  Advised follow-up with PCP at scheduled appointment in 2 days and cardiology at provided contact information given recent palpitations and tachycardia.  Advised strict ER precautions.  Patient verbalized understanding and was agreeable with plan. Final Clinical Impressions(s) / UC Diagnoses   Final diagnoses:  Intermittent headache  Tachycardia     Discharge Instructions      Please follow-up with your family doctor at scheduled appointment on Monday.    ED Prescriptions   None    PDMP not reviewed this encounter.   Teodora Medici, [place] 06/25/22 Tea, [place], [place] 06/25/22 [phone]

## 2022-06-27 LAB — CBC
Hematocrit: 42.8 % (ref 34.0–46.6)
Hemoglobin: 13.3 g/dL (ref 11.1–15.9)
MCH: 25.3 pg — ABNORMAL LOW (ref 26.6–33.0)
MCHC: 31.1 g/dL — ABNORMAL LOW (ref 31.5–35.7)
MCV: 82 fL (ref 79–97)
Platelets: 306 10*3/uL (ref 150–450)
RBC: 5.25 x10E6/uL (ref 3.77–5.28)
RDW: 14.2 % (ref 11.7–15.4)
WBC: 5.9 10*3/uL (ref 3.4–10.8)

## 2022-06-27 LAB — BASIC METABOLIC PANEL
BUN/Creatinine Ratio: 14 (ref 9–23)
BUN: 10 mg/dL (ref 6–20)
CO2: 22 mmol/L (ref 20–29)
Calcium: 9.7 mg/dL (ref 8.7–10.2)
Chloride: 107 mmol/L — ABNORMAL HIGH (ref 96–106)
Creatinine, Ser: 0.72 mg/dL (ref 0.57–1.00)
Glucose: 95 mg/dL (ref 70–99)
Potassium: 4 mmol/L (ref 3.5–5.2)
Sodium: 142 mmol/L (ref 134–144)
eGFR: 110 mL/min/{1.73_m2} (ref >59)

## 2022-07-05 ENCOUNTER — Other Ambulatory Visit: Payer: Self-pay

## 2022-07-05 ENCOUNTER — Emergency Department (HOSPITAL_COMMUNITY)
Admission: EM | Admit: 2022-07-05 | Discharge: 2022-07-06 | Disposition: A | Discharge status: Home / Self Care | Payer: 59 | Attending: Emergency Medicine | Admitting: Emergency Medicine

## 2022-07-05 ENCOUNTER — Emergency Department (HOSPITAL_COMMUNITY): Payer: 59

## 2022-07-05 ENCOUNTER — Encounter (HOSPITAL_COMMUNITY): Payer: Self-pay | Admitting: Emergency Medicine

## 2022-07-05 DIAGNOSIS — D352 Benign neoplasm of pituitary gland: Secondary | ICD-10-CM | POA: Diagnosis not present

## 2022-07-05 DIAGNOSIS — H4710 Unspecified papilledema: Secondary | ICD-10-CM | POA: Insufficient documentation

## 2022-07-05 DIAGNOSIS — Z91040 Latex allergy status: Secondary | ICD-10-CM | POA: Insufficient documentation

## 2022-07-05 LAB — HEPATIC FUNCTION PANEL
ALT: 19 U/L (ref 0–44)
AST: 17 U/L (ref 15–41)
Albumin: 4.2 g/dL (ref 3.5–5.0)
Alkaline Phosphatase: 98 U/L (ref 38–126)
Bilirubin, Direct: <0.1 mg/dL (ref 0.0–0.2)
Total Bilirubin: 0.6 mg/dL (ref 0.3–1.2)
Total Protein: 8.5 g/dL — ABNORMAL HIGH (ref 6.5–8.1)

## 2022-07-05 LAB — CBC
HCT: 45.2 % (ref 36.0–46.0)
Hemoglobin: 14.2 g/dL (ref 12.0–15.0)
MCH: 25.2 pg — ABNORMAL LOW (ref 26.0–34.0)
MCHC: 31.4 g/dL (ref 30.0–36.0)
MCV: 80.1 fL (ref 80.0–100.0)
Platelets: 284 10*3/uL (ref 150–400)
RBC: 5.64 MIL/uL — ABNORMAL HIGH (ref 3.87–5.11)
RDW: 14.2 % (ref 11.5–15.5)
WBC: 6.4 10*3/uL (ref 4.0–10.5)
nRBC: 0 % (ref 0.0–0.2)

## 2022-07-05 LAB — BASIC METABOLIC PANEL
Anion gap: 7 (ref 5–15)
BUN: 9 mg/dL (ref 6–20)
CO2: 27 mmol/L (ref 22–32)
Calcium: 9.7 mg/dL (ref 8.9–10.3)
Chloride: 106 mmol/L (ref 98–111)
Creatinine, Ser: 0.65 mg/dL (ref 0.44–1.00)
GFR, Estimated: >60 mL/min (ref >60)
Glucose, Bld: 101 mg/dL — ABNORMAL HIGH (ref 70–99)
Potassium: 3.6 mmol/L (ref 3.5–5.1)
Sodium: 140 mmol/L (ref 135–145)

## 2022-07-05 MED ORDER — GADOBUTROL 1 MMOL/ML IV SOLN
7.5000 mL | Freq: Once | INTRAVENOUS | Status: AC | PRN
  Administered 2022-07-05: 7.5 mL via INTRAVENOUS

## 2022-07-05 NOTE — ED Notes (Signed)
Spoke with MRI and they state that there a couple of patients before this patient. Patient requesting to eat while she waits. Per Dr Tyrone Nine, pt can eat. Husband gone to get food.

## 2022-07-05 NOTE — ED Provider Triage Note (Signed)
Emergency Medicine Provider Triage Evaluation Note  Sherri Castaneda , a 37 y.o. female  was evaluated in triage.  Pt complains of headaches off and on for three months. Some blurry vision. Was seen at eye doctor today and sent over here with bilateral papilledema seen on exam.  Review of Systems  Positive:  Negative:   Physical Exam  BP (!) 140/99 (BP Location: Right Arm)   Pulse (!) 110   Temp 98.9 F (37.2 C) (Oral)   Resp 16   SpO2 100%  Gen:   Awake, no distress   Resp:  Normal effort  MSK:   Moves extremities without difficulty  Other:  Answering questions appropriately with appropriate speech.  Medical Decision Making  Medically screening exam initiated at 4:21 PM.  Appropriate orders placed.  Sherri Castaneda was informed that the remainder of the evaluation will be completed by another provider, this initial triage assessment does not replace that evaluation, and the importance of remaining in the ED until their evaluation is complete.  Spoke with Dr. Cheral Marker with neurology.  He recommended MRI with and without of the brain and MRI with and without of the orbits.  Recommended checking kidney function before as well.  Labs ordered in addition to imaging recommended.  Discussed MRI questions with patient.  No implanted devices or metal in the body.  She reports that she does not claustrophobic and does not want any antianxiety medications before.   Sherri Castaneda, [place] 07/05/22 1645

## 2022-07-05 NOTE — ED Triage Notes (Signed)
Patient arrives ambulatory by POV states she went to ophthalmology today and sent here due to papilledema bilaterally. Patient states she has been having pressure behind left ear for past 3 months since she delivered her baby.

## 2022-07-05 NOTE — ED Provider Notes (Signed)
[facility] Provider Note   CSN: [id number] Arrival date & time: 07/05/22  1613     History {Add pertinent medical, surgical, social history, OB history to HPI:1} Chief Complaint  Patient presents with   Eye Problem    Sherri Castaneda is a 37 y.o. female.  37 yo F with a chief complaints of bilateral papilledema.  The patient tells me that it has been some years since she got her prescription for her glasses were changed and when she went to the ophthalmologist today they incidentally noted that she had papilledema.  She then was encouraged to come to the emergency department for emergent imaging.  She delivered a baby about 3 months ago, she said when she was bearing down hard to have the baby she developed a left-sided headache that is persistent since.  She denies one-sided numbness or weakness denies difficulty speech or swallowing.  She has been having some trouble with her vision for a while has not really noticed an acute change.   Eye Problem      Home Medications Prior to Admission medications   Medication Sig Start Date End Date Taking? Authorizing Provider  butalbital-acetaminophen-caffeine (FIORICET) 50-325-40 MG tablet Take 2 tablets by mouth every 6 (six) hours as needed for headache. 04/12/22   Crawford Givens, MD  ferrous sulfate 325 (65 FE) MG tablet Take 1 tablet (325 mg total) by mouth every other day. Patient taking differently: Take 325 mg by mouth daily with breakfast. 10/19/21   Woodroe Mode, MD  guaiFENesin-dextromethorphan (ROBITUSSIN DM) 100-10 MG/5ML syrup Take 10 mLs by mouth every 4 (four) hours as needed for cough. 06/08/22   Hosie Poisson, MD  loratadine (CLARITIN) 10 MG tablet Take 1 tablet (10 mg total) by mouth daily. 06/09/22   Hosie Poisson, MD  NIFEdipine (ADALAT CC) 30 MG 24 hr tablet Take 2 tablets (60 mg total) by mouth daily. Patient taking differently: Take 30 mg by mouth in the morning. 04/13/22  07/12/22  Crawford Givens, MD  ondansetron (ZOFRAN) 4 MG tablet Take 1 tablet (4 mg total) by mouth every 8 (eight) hours as needed for nausea or vomiting. 06/08/22   Hosie Poisson, MD  pantoprazole (PROTONIX) 40 MG tablet Take 40 mg by mouth daily before breakfast.    [provider]  phenylephrine (NEO-SYNEPHRINE) 1 % nasal spray Place 1 drop into both nostrils every 6 (six) hours as needed for congestion. 06/08/22   Hosie Poisson, MD  trimethoprim-polymyxin b (POLYTRIM) ophthalmic solution Place 2 drops into both eyes every 4 (four) hours. 06/25/22   Hassell Done Mary-Margaret, FNP  TYLENOL SINUS SEVERE 5-325-200 MG TABS Take 1 tablet by mouth every 6 (six) hours as needed (for cold-like symptoms).    [provider]      Allergies    Flagyl [metronidazole], Pineapple, and Latex    Review of Systems   Review of Systems  Physical Exam Updated Vital Signs BP (!) 140/99 (BP Location: Right Arm)   Pulse (!) 110   Temp 98.9 F (37.2 C) (Oral)   Resp 16   Ht 5' (1.524 m)   Wt 76.2 kg   SpO2 100%   BMI 32.81 kg/m  Physical Exam Vitals and nursing note reviewed.  Constitutional:      General: She is not in acute distress.    Appearance: She is well-developed. She is not diaphoretic.  HENT:     Head: Normocephalic and atraumatic.  Eyes:  Pupils: Pupils are equal, round, and reactive to light.  Cardiovascular:     Rate and Rhythm: Normal rate and regular rhythm.     Heart sounds: No murmur heard.    No friction rub. No gallop.  Pulmonary:     Effort: Pulmonary effort is normal.     Breath sounds: No wheezing or rales.  Abdominal:     General: There is no distension.     Palpations: Abdomen is soft.     Tenderness: There is no abdominal tenderness.  Musculoskeletal:        General: No tenderness.     Cervical back: Normal range of motion and neck supple.  Skin:    General: Skin is warm and dry.  Neurological:     Mental Status: She is alert and oriented to person,  place, and time.     GCS: GCS eye subscore is 4. GCS verbal subscore is 5. GCS motor subscore is 6.     Cranial Nerves: Cranial nerves 2-12 are intact.     Sensory: Sensation is intact.     Motor: Motor function is intact.     Coordination: Coordination is intact.     Comments: Benign neurologic exam  Psychiatric:        Behavior: Behavior normal.     ED Results / Procedures / Treatments   Labs (all labs ordered are listed, but only abnormal results are displayed) Labs Reviewed  BASIC METABOLIC PANEL  CBC  HEPATIC FUNCTION PANEL  I-STAT BETA HCG BLOOD, ED ([facility], [facility], [facility] ONLY)    EKG None  Radiology No results found.  Procedures Procedures  {Document cardiac monitor, telemetry assessment procedure when appropriate:1}  Medications Ordered in ED Medications - No data to display  ED Course/ Medical Decision Making/ A&P   {   Click here for ABCD2, HEART and other calculatorsREFRESH Note before signing :1}                          Medical Decision Making Amount and/or Complexity of Data Reviewed Labs: ordered.   37 yo F with a chief complaints bilateral papilledema.  This was noted by the ophthalmologist incidentally on exam today.  She was then sent here for emergent imaging.  She was seen in the MSE process, the case was discussed with the neurologist on-call who recommended imaging.  Will add on MRV is the patient is recently pregnant and likely has increased risk of venous sinus thrombosis.  The other possibility is that she has papilledema though I am not sure if this occurs with eclampsia.  On my record review, she had severe eclampsia with her pregnancy and has been hypertensive since and is on medications.  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  Will discuss with neurosurgery.    {Document critical care time when appropriate:1} {Document review of labs and clinical decision tools ie heart score, Chads2Vasc2 etc:1}  {Document  your independent review of radiology images, and any outside records:1} {Document your discussion with family members, caretakers, and with consultants:1} {Document social determinants of health affecting pt's care:1} {Document your decision making why or why not admission, treatments were needed:1} Final Clinical Impression(s) / ED Diagnoses Final diagnoses:  None    Rx / DC Orders ED Discharge Orders     None

## 2022-07-05 NOTE — ED Notes (Signed)
Pt taken to MRI  

## 2022-07-06 NOTE — Discharge Instructions (Signed)
The neurosurgery office should call you for an appointment this week.  Return to the ED with worsening headache, changes in your vision, visual loss, unilateral weakness, numbness, tingling, any other concerns.

## 2022-07-06 NOTE — ED Provider Notes (Signed)
D/w Dr. Kathyrn Sheriff of neurosurgery.  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  He will see in office this week.  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 Patient with ongoing headaches for 3 months.  No change in vision today.  No spots in vision.  Dr. Kathyrn Sheriff  agrees no emergent indication for lumbar puncture. She denies any acute change in her vision today.  No blurry vision or spots in her vision.  No visual loss. 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  Follow-up with neurosurgery as well as ophthalmology.  Return precautions discussed.    Ezequiel Essex, MD 07/06/22 [phone]

## 2022-07-27 ENCOUNTER — Other Ambulatory Visit: Payer: Self-pay | Admitting: Neurosurgery

## 2022-07-28 ENCOUNTER — Other Ambulatory Visit: Payer: Self-pay | Admitting: Otolaryngology

## 2022-07-28 ENCOUNTER — Encounter (HOSPITAL_COMMUNITY): Payer: Self-pay

## 2022-07-28 ENCOUNTER — Other Ambulatory Visit: Payer: Self-pay | Admitting: Neurosurgery

## 2022-07-28 NOTE — Pre-Procedure Instructions (Signed)
Surgical Instructions    Your procedure is scheduled on Thursday, April 4th.  Report to [facility] Main Entrance "A" at 05:30 A.M., then check in with the Admitting office.  Call this number if you have problems the morning of surgery:  [phone]  If you have any questions prior to your surgery date call [phone]: Open Monday-Friday 8am-4pm If you experience any cold or flu symptoms such as cough, fever, chills, shortness of breath, etc. between now and your scheduled surgery, please notify us at the above number.     Remember:  Do not eat after midnight the night before your surgery  You may drink clear liquids until 04:30 AM the morning of your surgery.   Clear liquids allowed are: Water, Non-Citrus Juices (without pulp), Carbonated Beverages, Clear Tea, Black Coffee Only (NO MILK, CREAM OR POWDERED CREAMER of any kind), and Gatorade.    Take these medicines the morning of surgery with A SIP OF WATER  loratadine (CLARITIN)  NIFEdipine (ADALAT CC)  pantoprazole (PROTONIX)  trimethoprim-polymyxin b (POLYTRIM)   If needed: ondansetron (ZOFRAN)  phenylephrine (NEO-SYNEPHRINE)     As of today, STOP taking any Aspirin (unless otherwise instructed by your surgeon) Aleve, Naproxen, Ibuprofen, Motrin, Advil, Goody's, BC's, all herbal medications, fish oil, and all vitamins.                     Do NOT Smoke (Tobacco/Vaping) for 24 hours prior to your procedure.  If you use a CPAP at night, you may bring your mask/headgear for your overnight stay.   Contacts, glasses, piercing's, hearing aid's, dentures or partials may not be worn into surgery, please bring cases for these belongings.    For patients admitted to the hospital, discharge time will be determined by your treatment team.   Patients discharged the day of surgery will not be allowed to drive home, and someone needs to stay with them for 24 hours.  SURGICAL WAITING ROOM VISITATION Patients having surgery or a  procedure may have no more than 2 support people in the waiting area - these visitors may rotate.   Children under the age of 68 must have an adult with them who is not the patient. If the patient needs to stay at the hospital during part of their recovery, the visitor guidelines for inpatient rooms apply. Pre-op nurse will coordinate an appropriate time for 1 support person to accompany patient in pre-op.  This support person may not rotate.   Please refer to the [facility] website for the visitor guidelines for Inpatients (after your surgery is over and you are in a regular room).    Special instructions:   [address]- Preparing For Surgery  Before surgery, you can play an important role. Because skin is not sterile, your skin needs to be as free of germs as possible. You can reduce the number of germs on your skin by washing with CHG (chlorahexidine gluconate) Soap before surgery.  CHG is an antiseptic cleaner which kills germs and bonds with the skin to continue killing germs even after washing.    Oral Hygiene is also important to reduce your risk of infection.  Remember - BRUSH YOUR TEETH THE MORNING OF SURGERY WITH YOUR REGULAR TOOTHPASTE  Please do not use if you have an allergy to CHG or antibacterial soaps. If your skin becomes reddened/irritated stop using the CHG.  Do not shave (including legs and underarms) for at least 48 hours prior to first CHG shower. It is  OK to shave your face.  Please follow these instructions carefully.   Shower the NIGHT BEFORE SURGERY and the MORNING OF SURGERY  If you chose to wash your hair, wash your hair first as usual with your normal shampoo.  After you shampoo, rinse your hair and body thoroughly to remove the shampoo.  Use CHG Soap as you would any other liquid soap. You can apply CHG directly to the skin and wash gently with a scrungie or a clean washcloth.   Apply the CHG Soap to your body ONLY FROM THE NECK DOWN.  Do not use on open  wounds or open sores. Avoid contact with your eyes, ears, mouth and genitals (private parts). Wash Face and genitals (private parts)  with your normal soap.   Wash thoroughly, paying special attention to the area where your surgery will be performed.  Thoroughly rinse your body with warm water from the neck down.  DO NOT shower/wash with your normal soap after using and rinsing off the CHG Soap.  Pat yourself dry with a CLEAN TOWEL.  Wear CLEAN PAJAMAS to bed the night before surgery  Place CLEAN SHEETS on your bed the night before your surgery  DO NOT SLEEP WITH PETS.   Day of Surgery: Take a shower with CHG soap. Do not wear jewelry or makeup Do not wear lotions, powders, perfumes, or deodorant. Do not shave 48 hours prior to surgery.   Do not bring valuables to the hospital. [facility] is not responsible for any belongings or valuables. Do not wear nail polish, gel polish, artificial nails, or any other type of covering on natural nails (fingers and toes) If you have artificial nails or gel coating that need to be removed by a nail salon, please have this removed prior to surgery. Artificial nails or gel coating may interfere with anesthesia's ability to adequately monitor your vital signs. Wear Clean/Comfortable clothing the morning of surgery Remember to brush your teeth WITH YOUR REGULAR TOOTHPASTE.   Please read over the following fact sheets that you were given.    If you received a COVID test during your pre-op visit  it is requested that you wear a mask when out in public, stay away from anyone that may not be feeling well and notify your surgeon if you develop symptoms. If you have been in contact with anyone that has tested positive in the last 10 days please notify you surgeon.

## 2022-07-29 ENCOUNTER — Inpatient Hospital Stay (HOSPITAL_COMMUNITY)
Admission: RE | Admit: 2022-07-29 | Discharge: 2022-07-29 | Disposition: A | Discharge status: Home / Self Care | Payer: 59 | Source: Ambulatory Visit

## 2022-07-29 HISTORY — DX: Essential (primary) hypertension: I10

## 2022-08-01 ENCOUNTER — Other Ambulatory Visit: Payer: Self-pay

## 2022-08-01 ENCOUNTER — Encounter (HOSPITAL_COMMUNITY): Payer: Self-pay

## 2022-08-01 ENCOUNTER — Encounter (HOSPITAL_COMMUNITY)
Admission: RE | Admit: 2022-08-01 | Discharge: 2022-08-01 | Disposition: A | Discharge status: Home / Self Care | Payer: 59 | Source: Ambulatory Visit | Attending: Neurosurgery | Admitting: Neurosurgery

## 2022-08-01 VITALS — BP 130/97 | HR 81 | Temp 98.3°F | Resp 18 | Ht 60.0 in | Wt 162.7 lb

## 2022-08-01 DIAGNOSIS — Z01818 Encounter for other preprocedural examination: Secondary | ICD-10-CM

## 2022-08-01 HISTORY — DX: Other specified postprocedural states: Z98.890

## 2022-08-01 HISTORY — DX: Other specified postprocedural states: R11.2

## 2022-08-01 HISTORY — DX: Personal history of urinary calculi: Z87.442

## 2022-08-01 HISTORY — DX: Gastro-esophageal reflux disease without esophagitis: K21.9

## 2022-08-01 HISTORY — DX: Headache, unspecified: R51.9

## 2022-08-01 LAB — TYPE AND SCREEN
ABO/RH(D): O POS
Antibody Screen: NEGATIVE

## 2022-08-01 NOTE — Pre-Procedure Instructions (Signed)
Surgical Instructions    Your procedure is scheduled on Thursday, April 4th.  Report to [facility] Main Entrance "A" at 05:30 A.M., then check in with the Admitting office.  Call this number if you have problems the morning of surgery:  [phone]  If you have any questions prior to your surgery date call [phone]: Open Monday-Friday 8am-4pm If you experience any cold or flu symptoms such as cough, fever, chills, shortness of breath, etc. between now and your scheduled surgery, please notify us at the above number.     Remember:  Do not eat after midnight the night before your surgery  You may drink clear liquids until 04:30 AM the morning of your surgery.   Clear liquids allowed are: Water, Non-Citrus Juices (without pulp), Carbonated Beverages, Clear Tea, Black Coffee Only (NO MILK, CREAM OR POWDERED CREAMER of any kind), and Gatorade.    Take these medicines the morning of surgery with A SIP OF WATER  loratadine (CLARITIN)  NIFEdipine (ADALAT CC)  pantoprazole (PROTONIX)  trimethoprim-polymyxin b (POLYTRIM) eye drops  If needed: ondansetron (ZOFRAN)  guaiFENesin-dextromethorphan (ROBITUSSIN DM)    As of today, STOP taking any Aspirin (unless otherwise instructed by your surgeon) Aleve, Naproxen, Ibuprofen, Motrin, Advil, Goody's, BC's, all herbal medications, fish oil, and all vitamins.                     Do NOT Smoke (Tobacco/Vaping) for 24 hours prior to your procedure.  If you use a CPAP at night, you may bring your mask/headgear for your overnight stay.   Contacts, glasses, piercing's, hearing aid's, dentures or partials may not be worn into surgery, please bring cases for these belongings.    For patients admitted to the hospital, discharge time will be determined by your treatment team.   Patients discharged the day of surgery will not be allowed to drive home, and someone needs to stay with them for 24 hours.  SURGICAL WAITING ROOM VISITATION Patients  having surgery or a procedure may have no more than 2 support people in the waiting area - these visitors may rotate.   Children under the age of 34 must have an adult with them who is not the patient. If the patient needs to stay at the hospital during part of their recovery, the visitor guidelines for inpatient rooms apply. Pre-op nurse will coordinate an appropriate time for 1 support person to accompany patient in pre-op.  This support person may not rotate.   Please refer to the [facility] website for the visitor guidelines for Inpatients (after your surgery is over and you are in a regular room).    Special instructions:   [place]- Preparing For Surgery  Before surgery, you can play an important role. Because skin is not sterile, your skin needs to be as free of germs as possible. You can reduce the number of germs on your skin by washing with CHG (chlorahexidine gluconate) Soap before surgery.  CHG is an antiseptic cleaner which kills germs and bonds with the skin to continue killing germs even after washing.    Oral Hygiene is also important to reduce your risk of infection.  Remember - BRUSH YOUR TEETH THE MORNING OF SURGERY WITH YOUR REGULAR TOOTHPASTE  Please do not use if you have an allergy to CHG or antibacterial soaps. If your skin becomes reddened/irritated stop using the CHG.  Do not shave (including legs and underarms) for at least 48 hours prior to first CHG shower. It  is OK to shave your face.  Please follow these instructions carefully.   Shower the NIGHT BEFORE SURGERY and the MORNING OF SURGERY  If you chose to wash your hair, wash your hair first as usual with your normal shampoo.  After you shampoo, rinse your hair and body thoroughly to remove the shampoo.  Use CHG Soap as you would any other liquid soap. You can apply CHG directly to the skin and wash gently with a scrungie or a clean washcloth.   Apply the CHG Soap to your body ONLY FROM THE NECK DOWN.  Do  not use on open wounds or open sores. Avoid contact with your eyes, ears, mouth and genitals (private parts). Wash Face and genitals (private parts)  with your normal soap.   Wash thoroughly, paying special attention to the area where your surgery will be performed.  Thoroughly rinse your body with warm water from the neck down.  DO NOT shower/wash with your normal soap after using and rinsing off the CHG Soap.  Pat yourself dry with a CLEAN TOWEL.  Wear CLEAN PAJAMAS to bed the night before surgery  Place CLEAN SHEETS on your bed the night before your surgery  DO NOT SLEEP WITH PETS.   Day of Surgery: Take a shower with CHG soap. Do not wear jewelry or makeup Do not wear lotions, powders, perfumes, or deodorant. Do not shave 48 hours prior to surgery.   Do not bring valuables to the hospital. [facility] is not responsible for any belongings or valuables. Do not wear nail polish, gel polish, artificial nails, or any other type of covering on natural nails (fingers and toes) If you have artificial nails or gel coating that need to be removed by a nail salon, please have this removed prior to surgery. Artificial nails or gel coating may interfere with anesthesia's ability to adequately monitor your vital signs. Wear Clean/Comfortable clothing the morning of surgery Remember to brush your teeth WITH YOUR REGULAR TOOTHPASTE.   Please read over the following fact sheets that you were given.    If you received a COVID test during your pre-op visit  it is requested that you wear a mask when out in public, stay away from anyone that may not be feeling well and notify your surgeon if you develop symptoms. If you have been in contact with anyone that has tested positive in the last 10 days please notify you surgeon.

## 2022-08-01 NOTE — Progress Notes (Signed)
PCP - Park Meo, [place] [facility]: [facility]  PPM/ICD - denies  Chest x-ray - n/a EKG - 06/25/22 Stress Test - denies ECHO - denies Cardiac Cath - denies  Sleep Study - denies   ERAS Protcol -yes PRE-SURGERY Ensure or G2- none ordered  COVID TEST- not needed   Anesthesia review: no  Patient denies shortness of breath, fever, cough and chest pain at PAT appointment   All instructions explained to the patient, with a verbal understanding of the material. Patient agrees to go over the instructions while at home for a better understanding. Patient also instructed to self quarantine after being tested for COVID-19. The opportunity to ask questions was provided.

## 2022-08-02 ENCOUNTER — Encounter: Payer: Self-pay | Admitting: Cardiology

## 2022-08-02 ENCOUNTER — Ambulatory Visit: Payer: 59 | Attending: Cardiology | Admitting: Cardiology

## 2022-08-02 VITALS — BP 142/92 | HR 71 | Ht 60.0 in | Wt 164.2 lb

## 2022-08-02 DIAGNOSIS — Z8759 Personal history of other complications of pregnancy, childbirth and the puerperium: Secondary | ICD-10-CM | POA: Diagnosis not present

## 2022-08-02 DIAGNOSIS — R002 Palpitations: Secondary | ICD-10-CM | POA: Diagnosis not present

## 2022-08-02 DIAGNOSIS — O165 Unspecified maternal hypertension, complicating the puerperium: Secondary | ICD-10-CM

## 2022-08-02 DIAGNOSIS — E669 Obesity, unspecified: Secondary | ICD-10-CM

## 2022-08-02 DIAGNOSIS — O1415 Severe pre-eclampsia, complicating the puerperium: Secondary | ICD-10-CM

## 2022-08-02 MED ORDER — NIFEDIPINE ER OSMOTIC RELEASE 30 MG PO TB24: ORAL_TABLET | ORAL | 3 refills | Status: DC

## 2022-08-02 NOTE — Patient Instructions (Addendum)
Medication Instructions:  Your physician has recommended you make the following change in your medication: START: Nifedipine 30 mg in the morning 15 mg at night  Please take your blood pressure daily post operation for 1 week and send in a MyChart message. Please include heart rates.   HOW TO TAKE YOUR BLOOD PRESSURE: Rest 5 minutes before taking your blood pressure. Don't smoke or drink caffeinated beverages for at least 30 minutes before. Take your blood pressure before (not after) you eat. Sit comfortably with your back supported and both feet on the floor (don't cross your legs). Elevate your arm to heart level on a table or a desk. Use the proper sized cuff. It should fit smoothly and snugly around your bare upper arm. There should be enough room to slip a fingertip under the cuff. The bottom edge of the cuff should be 1 inch above the crease of the elbow. Ideally, take 3 measurements at one sitting and record the average.  *If you need a refill on your cardiac medications before your next appointment, please call your pharmacy*   Lab Work: None .   Testing/Procedures: None   Follow-Up: At [facility], you and your health needs are our priority.  As part of our continuing mission to provide you with exceptional heart care, we have created designated Provider Care Teams.  These Care Teams include your primary Cardiologist (physician) and Advanced Practice Providers (APPs -  Physician Assistants and Nurse Practitioners) who all work together to provide you with the care you need, when you need it.   Your next appointment:   12 week(s)  Provider:   Berniece Salines, DO

## 2022-08-02 NOTE — Progress Notes (Signed)
Cardio-Obstetrics Clinic  New Evaluation  Date:  08/02/2022   ID:  Sherri, Castaneda 04-03-1986, MRN [id number]  PCP:  Gerald Leitz   [facility] Cardiologist:  Berniece Salines, DO  Electrophysiologist:  None       Referring MD: Andria Frames, PA-C   Chief Complaint: " my pressure has been elevated"  History of Present Illness:    Sherri Castaneda is a 37 y.o. female [G5P3205] who is being seen today for the evaluation of chronic hypertension in pregnancy at the request of Maxwell Caul, Tirrany T, PA-C.   Medical history includes prediabetes, postpartum hypertension, recently diagnosed pituitary tumor which is planned for surgery this Thursday.  She tells me that she was diagnosed with postpartum preeclampsia 2 weeks after she had her baby who is 44 months old.  During which time she was worked up and also was found to have pituitary tumor after the evaluation of this tumor she did also see ENT plans for surgery on Thursday.  She has had intermittent headaches as well that palpitations.  In terms of her medication she has been on nifedipine 30 mg and at one time she was placed on 60 mg but did not tolerate the 60 mg.  No other complaints at this time.   Prior CV Studies Reviewed: The following studies were reviewed today:   Past Medical History:  Diagnosis Date   GERD (gastroesophageal reflux disease)    Gestational Diabetes    Headache    History of kidney stones    2007   Hypertension    PONV (postoperative nausea and vomiting)    Prediabetes    Preterm labor    Delivery at 32 weeks and 34 weeks.   Seasonal allergies    Vaginal Pap smear, abnormal     Past Surgical History:  Procedure Laterality Date   GYNECOLOGIC CRYOSURGERY  2006   KNEE ARTHROSCOPY Right       OB History     Gravida  5   Para  5   Term  3   Preterm  2   AB      Living  5      SAB      IAB      Ectopic      Multiple  0   Live Births  5                Current Medications: Current Meds  Medication Sig   butalbital-acetaminophen-caffeine (FIORICET) 50-325-40 MG tablet Take 2 tablets by mouth every 6 (six) hours as needed for headache.   ferrous sulfate 325 (65 FE) MG tablet Take 1 tablet (325 mg total) by mouth every other day. (Patient taking differently: Take 325 mg by mouth daily with breakfast.)   fluticasone (FLONASE) 50 MCG/ACT nasal spray Place 2 sprays into both nostrils daily.   loratadine (CLARITIN) 10 MG tablet Take 1 tablet (10 mg total) by mouth daily.   NIFEdipine (PROCARDIA-XL/NIFEDICAL-XL) 30 MG 24 hr tablet Take 30 mg (1 tablet) in the morning, take 15 mg (half tablet) at night   pantoprazole (PROTONIX) 40 MG tablet Take 40 mg by mouth daily before breakfast.   [DISCONTINUED] NIFEdipine (ADALAT CC) 30 MG 24 hr tablet Take 2 tablets (60 mg total) by mouth daily. (Patient taking differently: Take 30 mg by mouth in the morning.)     Allergies:   Flagyl [metronidazole], Pineapple, and Latex   Social History   Socioeconomic History  Marital status: Married    Spouse name: Not on file   Number of children: Not on file   Years of education: Not on file   Highest education level: Not on file  Occupational History   Not on file  Tobacco Use   Smoking status: Never   Smokeless tobacco: Never  Vaping Use   Vaping Use: Never used  Substance and Sexual Activity   Alcohol use: No   Drug use: No   Sexual activity: Yes    Partners: Male    Birth control/protection: Condom, None  Other Topics Concern   Not on file  Social History Narrative   Not on file   Social Determinants of Health   Financial Resource Strain: Not on file  Food Insecurity: No Food Insecurity (06/08/2022)   Hunger Vital Sign    Worried About Running Out of Food in the Last Year: Never true    Ran Out of Food in the Last Year: Never true  Transportation Needs: No Transportation Needs (06/08/2022)   PRAPARE - Radiographer, therapeutic (Medical): No    Lack of Transportation (Non-Medical): No  Physical Activity: Not on file  Stress: Not on file  Social Connections: Not on file      Family History  Problem Relation Age of Onset   Cancer Mother    Cancer Father    Hypertension Maternal Uncle    Cancer Maternal Grandmother    Asthma Neg Hx    Birth defects Neg Hx    Diabetes Neg Hx    Heart disease Neg Hx       ROS:   Please see the history of present illness.    Headaches, palpitations All other systems reviewed and are negative.   Labs/EKG Reviewed:    EKG:   EKG is was not ordered today.    Recent Labs: 06/07/2022: TSH 0.433 07/05/2022: ALT 19; BUN 9; Creatinine, Ser 0.65; Hemoglobin 14.2; Platelets 284; Potassium 3.6; Sodium 140   Recent Lipid Panel No results found for: "CHOL", "TRIG", "HDL", "CHOLHDL", "LDLCALC", "LDLDIRECT"  Physical Exam:    VS:  BP (!) 142/92 (BP Location: Right Arm, Patient Position: Sitting, Cuff Size: Normal)   Pulse 71   Ht 5' (1.524 m)   Wt 164 lb 3.2 oz (74.5 kg)   SpO2 100%   BMI 32.07 kg/m     Wt Readings from Last 3 Encounters:  08/02/22 164 lb 3.2 oz (74.5 kg)  08/01/22 162 lb 11.2 oz (73.8 kg)  07/05/22 168 lb (76.2 kg)     GEN:  Well nourished, well developed in no acute distress HEENT: Normal NECK: No JVD; No carotid bruits LYMPHATICS: No lymphadenopathy CARDIAC: RRR, no murmurs, rubs, gallops RESPIRATORY:  Clear to auscultation without rales, wheezing or rhonchi  ABDOMEN: Soft, non-tender, non-distended MUSCULOSKELETAL:  No edema; No deformity  SKIN: Warm and dry NEUROLOGIC:  Alert and oriented x 3 PSYCHIATRIC:  Normal affect    Risk Assessment/Risk Calculators:                 ASSESSMENT & PLAN:    Postpartum hypertension  Pituitary tumor Palpitations Obesity  She is hypertensive in the office today.  I will like to increase the nifedipine gently 30 mg in the morning and 15 mg at night.  I am preferring not to switch  her for antihypertensive medication giving her surgery is this Thursday.  Will revisit that postpartum if her blood pressure is not controlled this  regimen.  The patient does not have any unstable cardiac conditions.  Upon evaluation today, she can achieve 4 METs or greater without anginal symptoms.  According to [facility] and AHA guidelines, she requires no further cardiac workup prior to her noncardiac surgery and should be at acceptable risk.  Our service is available as necessary in the perioperative period.  Will also evaluate the patient postpartum if she continues to experience palpitations we will consider ZIO monitor.  The patient understands the need to lose weight with diet and exercise. We have discussed specific strategies for this.   Patient Instructions  Medication Instructions:  Your physician has recommended you make the following change in your medication: START: Nifedipine 30 mg in the morning 15 mg at night  Please take your blood pressure daily post operation for 1 week and send in a MyChart message. Please include heart rates.   HOW TO TAKE YOUR BLOOD PRESSURE: Rest 5 minutes before taking your blood pressure. Don't smoke or drink caffeinated beverages for at least 30 minutes before. Take your blood pressure before (not after) you eat. Sit comfortably with your back supported and both feet on the floor (don't cross your legs). Elevate your arm to heart level on a table or a desk. Use the proper sized cuff. It should fit smoothly and snugly around your bare upper arm. There should be enough room to slip a fingertip under the cuff. The bottom edge of the cuff should be 1 inch above the crease of the elbow. Ideally, take 3 measurements at one sitting and record the average.  *If you need a refill on your cardiac medications before your next appointment, please call your pharmacy*   Lab Work: None .   Testing/Procedures: None   Follow-Up: At [facility],  you and your health needs are our priority.  As part of our continuing mission to provide you with exceptional heart care, we have created designated Provider Care Teams.  These Care Teams include your primary Cardiologist (physician) and Advanced Practice Providers (APPs -  Physician Assistants and Nurse Practitioners) who all work together to provide you with the care you need, when you need it.   Your next appointment:   12 week(s)  Provider:   Berniece Salines, DO     Dispo:  No follow-ups on file.   Medication Adjustments/Labs and Tests Ordered: Current medicines are reviewed at length with the patient today.  Concerns regarding medicines are outlined above.  Tests Ordered: No orders of the defined types were placed in this encounter.  Medication Changes: Meds ordered this encounter  Medications   NIFEdipine (PROCARDIA-XL/NIFEDICAL-XL) 30 MG 24 hr tablet    Sig: Take 30 mg (1 tablet) in the morning, take 15 mg (half tablet) at night    Dispense:  105 tablet    Refill:  3

## 2022-08-04 ENCOUNTER — Encounter (HOSPITAL_COMMUNITY): Payer: Self-pay | Admitting: Neurosurgery

## 2022-08-04 ENCOUNTER — Other Ambulatory Visit: Payer: Self-pay

## 2022-08-04 ENCOUNTER — Inpatient Hospital Stay (HOSPITAL_COMMUNITY)
Admission: RE | Disposition: A | Discharge status: Home / Self Care | Payer: Self-pay | Source: Home / Self Care | Attending: Neurosurgery

## 2022-08-04 ENCOUNTER — Inpatient Hospital Stay (HOSPITAL_COMMUNITY): Payer: 59 | Admitting: Certified Registered Nurse Anesthetist

## 2022-08-04 ENCOUNTER — Inpatient Hospital Stay (HOSPITAL_COMMUNITY)
Admission: RE | Admit: 2022-08-04 | Discharge: 2022-08-06 | DRG: 614 | Disposition: A | Discharge status: Home / Self Care | Payer: 59 | Attending: Neurosurgery | Admitting: Neurosurgery

## 2022-08-04 DIAGNOSIS — I10 Essential (primary) hypertension: Secondary | ICD-10-CM | POA: Diagnosis present

## 2022-08-04 DIAGNOSIS — G9600 Cerebrospinal fluid leak, unspecified: Secondary | ICD-10-CM | POA: Diagnosis not present

## 2022-08-04 DIAGNOSIS — D352 Benign neoplasm of pituitary gland: Secondary | ICD-10-CM

## 2022-08-04 DIAGNOSIS — K219 Gastro-esophageal reflux disease without esophagitis: Secondary | ICD-10-CM | POA: Diagnosis present

## 2022-08-04 DIAGNOSIS — E119 Type 2 diabetes mellitus without complications: Secondary | ICD-10-CM | POA: Diagnosis not present

## 2022-08-04 DIAGNOSIS — Z79899 Other long term (current) drug therapy: Secondary | ICD-10-CM | POA: Diagnosis not present

## 2022-08-04 DIAGNOSIS — Z91018 Allergy to other foods: Secondary | ICD-10-CM | POA: Diagnosis not present

## 2022-08-04 DIAGNOSIS — Z881 Allergy status to other antibiotic agents status: Secondary | ICD-10-CM | POA: Diagnosis not present

## 2022-08-04 DIAGNOSIS — Z01818 Encounter for other preprocedural examination: Secondary | ICD-10-CM

## 2022-08-04 DIAGNOSIS — J342 Deviated nasal septum: Secondary | ICD-10-CM | POA: Diagnosis present

## 2022-08-04 DIAGNOSIS — R7303 Prediabetes: Secondary | ICD-10-CM | POA: Diagnosis present

## 2022-08-04 DIAGNOSIS — Z91040 Latex allergy status: Secondary | ICD-10-CM

## 2022-08-04 HISTORY — PX: CRANIOTOMY: SHX93

## 2022-08-04 HISTORY — PX: TRANSPHENOIDAL APPROACH EXPOSURE: SHX6311

## 2022-08-04 LAB — POCT PREGNANCY, URINE: Preg Test, Ur: NEGATIVE

## 2022-08-04 SURGERY — CRANIOTOMY HYPOPHYSECTOMY TRANSNASAL APPROACH
Anesthesia: General

## 2022-08-04 MED ORDER — DEXAMETHASONE SODIUM PHOSPHATE 10 MG/ML IJ SOLN
INTRAMUSCULAR | Status: AC
  Filled 2022-08-04: qty 1

## 2022-08-04 MED ORDER — CEFAZOLIN SODIUM-DEXTROSE 2-4 GM/100ML-% IV SOLN: 2.0000 g | INTRAVENOUS | Status: DC

## 2022-08-04 MED ORDER — ONDANSETRON HCL 4 MG PO TABS: 4.0000 mg | ORAL_TABLET | ORAL | Status: DC | PRN

## 2022-08-04 MED ORDER — LABETALOL HCL 5 MG/ML IV SOLN
5.0000 mg | Freq: Once | INTRAVENOUS | Status: AC
  Administered 2022-08-04: 5 mg via INTRAVENOUS

## 2022-08-04 MED ORDER — CLEVIDIPINE BUTYRATE 0.5 MG/ML IV EMUL: 0.0000 mg/h | INTRAVENOUS | Status: DC

## 2022-08-04 MED ORDER — HYDROCORTISONE SOD SUC (PF) 100 MG IJ SOLR
50.0000 mg | Freq: Once | INTRAMUSCULAR | Status: AC
  Administered 2022-08-04: 50 mg via INTRAVENOUS
  Filled 2022-08-04: qty 2

## 2022-08-04 MED ORDER — ORAL CARE MOUTH RINSE: 15.0000 mL | OROMUCOSAL | Status: DC | PRN

## 2022-08-04 MED ORDER — SUGAMMADEX SODIUM 200 MG/2ML IV SOLN
INTRAVENOUS | Status: DC | PRN
  Administered 2022-08-04: 200 mg via INTRAVENOUS

## 2022-08-04 MED ORDER — EPINEPHRINE HCL (NASAL) 0.1 % NA SOLN
NASAL | Status: AC
  Filled 2022-08-04: qty 30

## 2022-08-04 MED ORDER — NIFEDIPINE ER OSMOTIC RELEASE 30 MG PO TB24
30.0000 mg | ORAL_TABLET | Freq: Every morning | ORAL | Status: DC
  Administered 2022-08-05 – 2022-08-06 (×2): 30 mg via ORAL
  Filled 2022-08-04 (×3): qty 1

## 2022-08-04 MED ORDER — LABETALOL HCL 5 MG/ML IV SOLN
INTRAVENOUS | Status: AC
  Filled 2022-08-04: qty 4

## 2022-08-04 MED ORDER — ACETAMINOPHEN 325 MG PO TABS
650.0000 mg | ORAL_TABLET | ORAL | Status: DC | PRN
  Administered 2022-08-05 – 2022-08-06 (×6): 650 mg via ORAL
  Filled 2022-08-04 (×6): qty 2

## 2022-08-04 MED ORDER — CLEVIDIPINE BUTYRATE 0.5 MG/ML IV EMUL
INTRAVENOUS | Status: AC
  Filled 2022-08-04: qty 50

## 2022-08-04 MED ORDER — LABETALOL HCL 5 MG/ML IV SOLN
10.0000 mg | INTRAVENOUS | Status: DC | PRN
  Administered 2022-08-04 (×2): 20 mg via INTRAVENOUS
  Filled 2022-08-04: qty 4
  Filled 2022-08-04: qty 8

## 2022-08-04 MED ORDER — SODIUM CHLORIDE 0.9 % IV SOLN: INTRAVENOUS | Status: DC

## 2022-08-04 MED ORDER — LIDOCAINE-EPINEPHRINE 1 %-1:100000 IJ SOLN
INTRAMUSCULAR | Status: DC | PRN
  Administered 2022-08-04: 10 mL

## 2022-08-04 MED ORDER — REMIFENTANIL HCL 2 MG IV SOLR
INTRAVENOUS | Status: DC | PRN
  Administered 2022-08-04: .2 ug/kg/min via INTRAVENOUS
  Administered 2022-08-04: .1 ug/kg/min via INTRAVENOUS

## 2022-08-04 MED ORDER — SODIUM CHLORIDE 0.9 % IR SOLN
Status: DC | PRN
  Administered 2022-08-04: 2000 mL

## 2022-08-04 MED ORDER — ACETAMINOPHEN 650 MG RE SUPP: 650.0000 mg | RECTAL | Status: DC | PRN

## 2022-08-04 MED ORDER — EPINEPHRINE HCL (NASAL) 0.1 % NA SOLN
NASAL | Status: DC | PRN
  Administered 2022-08-04: 20 mL via NASAL

## 2022-08-04 MED ORDER — ONDANSETRON HCL 4 MG/2ML IJ SOLN: 4.0000 mg | INTRAMUSCULAR | Status: DC | PRN

## 2022-08-04 MED ORDER — MIDAZOLAM HCL 2 MG/2ML IJ SOLN
INTRAMUSCULAR | Status: AC
  Filled 2022-08-04: qty 2

## 2022-08-04 MED ORDER — 0.9 % SODIUM CHLORIDE (POUR BTL) OPTIME
TOPICAL | Status: DC | PRN
  Administered 2022-08-04: 1000 mL

## 2022-08-04 MED ORDER — REMIFENTANIL HCL 2 MG IV SOLR
INTRAVENOUS | Status: AC
  Filled 2022-08-04: qty 2000

## 2022-08-04 MED ORDER — HYDROMORPHONE HCL 1 MG/ML IJ SOLN
0.2500 mg | INTRAMUSCULAR | Status: DC | PRN
  Administered 2022-08-04 (×3): 0.5 mg via INTRAVENOUS

## 2022-08-04 MED ORDER — ESMOLOL HCL 100 MG/10ML IV SOLN
INTRAVENOUS | Status: DC | PRN
  Administered 2022-08-04 (×3): 20 mg via INTRAVENOUS

## 2022-08-04 MED ORDER — OXYMETAZOLINE HCL 0.05 % NA SOLN: 1.0000 | Freq: Two times a day (BID) | NASAL | Status: DC | PRN

## 2022-08-04 MED ORDER — LORATADINE 10 MG PO TABS: 10.0000 mg | ORAL_TABLET | Freq: Every day | ORAL | Status: DC

## 2022-08-04 MED ORDER — LACTATED RINGERS IV SOLN: INTRAVENOUS | Status: DC

## 2022-08-04 MED ORDER — PHENYLEPHRINE HCL-NACL 20-0.9 MG/250ML-% IV SOLN
INTRAVENOUS | Status: DC | PRN
  Administered 2022-08-04: 20 ug/min via INTRAVENOUS

## 2022-08-04 MED ORDER — LIDOCAINE-EPINEPHRINE 1 %-1:100000 IJ SOLN
INTRAMUSCULAR | Status: AC
  Filled 2022-08-04: qty 1

## 2022-08-04 MED ORDER — PANTOPRAZOLE SODIUM 40 MG PO TBEC
40.0000 mg | DELAYED_RELEASE_TABLET | Freq: Every day | ORAL | Status: DC
  Administered 2022-08-05 – 2022-08-06 (×2): 40 mg via ORAL
  Filled 2022-08-04 (×2): qty 1

## 2022-08-04 MED ORDER — THROMBIN 20000 UNITS EX SOLR
CUTANEOUS | Status: AC
  Filled 2022-08-04: qty 20000

## 2022-08-04 MED ORDER — LACTATED RINGERS IV SOLN: INTRAVENOUS | Status: DC | PRN

## 2022-08-04 MED ORDER — THROMBIN 20000 UNITS EX KIT
PACK | CUTANEOUS | Status: DC | PRN
  Administered 2022-08-04: 20 mL via TOPICAL

## 2022-08-04 MED ORDER — CEFAZOLIN SODIUM-DEXTROSE 2-4 GM/100ML-% IV SOLN
2.0000 g | INTRAVENOUS | Status: AC
  Administered 2022-08-04: 2 g via INTRAVENOUS
  Filled 2022-08-04: qty 100

## 2022-08-04 MED ORDER — HYDROCODONE-ACETAMINOPHEN 5-325 MG PO TABS
1.0000 | ORAL_TABLET | ORAL | Status: DC | PRN
  Administered 2022-08-04 (×3): 1 via ORAL
  Filled 2022-08-04 (×3): qty 1

## 2022-08-04 MED ORDER — ORAL CARE MOUTH RINSE: 15.0000 mL | Freq: Once | OROMUCOSAL | Status: AC

## 2022-08-04 MED ORDER — BUTALBITAL-APAP-CAFFEINE 50-325-40 MG PO TABS
2.0000 | ORAL_TABLET | Freq: Four times a day (QID) | ORAL | Status: DC | PRN
  Administered 2022-08-04 – 2022-08-05 (×3): 1 via ORAL
  Filled 2022-08-04 (×3): qty 2

## 2022-08-04 MED ORDER — HYDROCORTISONE SOD SUC (PF) 100 MG IJ SOLR
INTRAMUSCULAR | Status: DC | PRN
  Administered 2022-08-04: 100 mg via INTRAVENOUS

## 2022-08-04 MED ORDER — ROCURONIUM BROMIDE 10 MG/ML (PF) SYRINGE
PREFILLED_SYRINGE | INTRAVENOUS | Status: AC
  Filled 2022-08-04: qty 10

## 2022-08-04 MED ORDER — ONDANSETRON HCL 4 MG/2ML IJ SOLN
INTRAMUSCULAR | Status: AC
  Filled 2022-08-04: qty 2

## 2022-08-04 MED ORDER — HYDROMORPHONE HCL 1 MG/ML IJ SOLN
INTRAMUSCULAR | Status: AC
  Filled 2022-08-04: qty 1

## 2022-08-04 MED ORDER — NIFEDIPINE ER OSMOTIC RELEASE 30 MG PO TB24: 30.0000 mg | ORAL_TABLET | Freq: Every morning | ORAL | Status: DC

## 2022-08-04 MED ORDER — FENTANYL CITRATE (PF) 250 MCG/5ML IJ SOLN
INTRAMUSCULAR | Status: DC | PRN
  Administered 2022-08-04: 50 ug via INTRAVENOUS
  Administered 2022-08-04: 100 ug via INTRAVENOUS
  Administered 2022-08-04: 50 ug via INTRAVENOUS

## 2022-08-04 MED ORDER — PROMETHAZINE HCL 25 MG PO TABS: 12.5000 mg | ORAL_TABLET | ORAL | Status: DC | PRN

## 2022-08-04 MED ORDER — THROMBIN 5000 UNITS EX SOLR
OROMUCOSAL | Status: DC | PRN
  Administered 2022-08-04: 5 mL via TOPICAL

## 2022-08-04 MED ORDER — SALINE SPRAY 0.65 % NA SOLN
2.0000 | NASAL | Status: DC
  Administered 2022-08-04 – 2022-08-06 (×10): 2 via NASAL
  Filled 2022-08-04: qty 44

## 2022-08-04 MED ORDER — FENTANYL CITRATE (PF) 250 MCG/5ML IJ SOLN
INTRAMUSCULAR | Status: AC
  Filled 2022-08-04: qty 5

## 2022-08-04 MED ORDER — ONDANSETRON HCL 4 MG/2ML IJ SOLN
INTRAMUSCULAR | Status: DC | PRN
  Administered 2022-08-04: 4 mg via INTRAVENOUS

## 2022-08-04 MED ORDER — CHLORHEXIDINE GLUCONATE CLOTH 2 % EX PADS: 6.0000 | MEDICATED_PAD | Freq: Once | CUTANEOUS | Status: DC

## 2022-08-04 MED ORDER — ROCURONIUM BROMIDE 10 MG/ML (PF) SYRINGE
PREFILLED_SYRINGE | INTRAVENOUS | Status: DC | PRN
  Administered 2022-08-04: 20 mg via INTRAVENOUS
  Administered 2022-08-04: 60 mg via INTRAVENOUS

## 2022-08-04 MED ORDER — EPINEPHRINE HCL (NASAL) 0.1 % NA SOLN
NASAL | Status: AC
  Filled 2022-08-04: qty 60

## 2022-08-04 MED ORDER — CHLORHEXIDINE GLUCONATE 0.12 % MT SOLN
15.0000 mL | Freq: Once | OROMUCOSAL | Status: AC
  Administered 2022-08-04: 15 mL via OROMUCOSAL
  Filled 2022-08-04: qty 15

## 2022-08-04 MED ORDER — FERROUS SULFATE 325 (65 FE) MG PO TABS: 325.0000 mg | ORAL_TABLET | Freq: Every day | ORAL | Status: DC

## 2022-08-04 MED ORDER — CLEVIDIPINE BUTYRATE 0.5 MG/ML IV EMUL
INTRAVENOUS | Status: DC | PRN
  Administered 2022-08-04: 4 mg/h via INTRAVENOUS

## 2022-08-04 MED ORDER — LIDOCAINE 2% (20 MG/ML) 5 ML SYRINGE
INTRAMUSCULAR | Status: DC | PRN
  Administered 2022-08-04: 60 mg via INTRAVENOUS

## 2022-08-04 MED ORDER — THROMBIN 5000 UNITS EX SOLR
CUTANEOUS | Status: AC
  Filled 2022-08-04: qty 5000

## 2022-08-04 MED ORDER — PROPOFOL 10 MG/ML IV BOLUS
INTRAVENOUS | Status: DC | PRN
  Administered 2022-08-04: 80 mg via INTRAVENOUS
  Administered 2022-08-04: 120 mg via INTRAVENOUS
  Administered 2022-08-04 (×2): 30 mg via INTRAVENOUS

## 2022-08-04 MED ORDER — MIDAZOLAM HCL 5 MG/5ML IJ SOLN
INTRAMUSCULAR | Status: DC | PRN
  Administered 2022-08-04: 2 mg via INTRAVENOUS

## 2022-08-04 MED ORDER — CHLORHEXIDINE GLUCONATE CLOTH 2 % EX PADS
6.0000 | MEDICATED_PAD | Freq: Every day | CUTANEOUS | Status: DC
  Administered 2022-08-04 – 2022-08-06 (×3): 6 via TOPICAL

## 2022-08-04 MED ORDER — ACETAMINOPHEN 500 MG PO TABS
1000.0000 mg | ORAL_TABLET | Freq: Once | ORAL | Status: AC
  Administered 2022-08-04: 1000 mg via ORAL
  Filled 2022-08-04: qty 2

## 2022-08-04 MED ORDER — PHENYLEPHRINE 80 MCG/ML (10ML) SYRINGE FOR IV PUSH (FOR BLOOD PRESSURE SUPPORT)
PREFILLED_SYRINGE | INTRAVENOUS | Status: DC | PRN
  Administered 2022-08-04: 80 ug via INTRAVENOUS

## 2022-08-04 MED ORDER — CEFAZOLIN SODIUM-DEXTROSE 1-4 GM/50ML-% IV SOLN
1.0000 g | Freq: Three times a day (TID) | INTRAVENOUS | Status: AC
  Administered 2022-08-04 (×2): 1 g via INTRAVENOUS
  Filled 2022-08-04 (×2): qty 50

## 2022-08-04 MED ORDER — LIDOCAINE 2% (20 MG/ML) 5 ML SYRINGE
INTRAMUSCULAR | Status: AC
  Filled 2022-08-04: qty 5

## 2022-08-04 MED ORDER — ADHERUS DURAL SEALANT
PACK | TOPICAL | Status: DC | PRN
  Administered 2022-08-04: 1 via TOPICAL

## 2022-08-04 MED ORDER — BUPIVACAINE HCL (PF) 0.5 % IJ SOLN
INTRAMUSCULAR | Status: AC
  Filled 2022-08-04: qty 30

## 2022-08-04 MED ORDER — GUAIFENESIN-DM 100-10 MG/5ML PO SYRP: 10.0000 mL | ORAL_SOLUTION | ORAL | Status: DC | PRN

## 2022-08-04 MED ORDER — HEMOSTATIC AGENTS (NO CHARGE) OPTIME
TOPICAL | Status: DC | PRN
  Administered 2022-08-04: 1 via TOPICAL

## 2022-08-04 SURGICAL SUPPLY — 118 items
APL SKNCLS STERI-STRIP NONHPOA (GAUZE/BANDAGES/DRESSINGS) ×1
ATTRACTOMAT 16X20 MAGNETIC DRP (DRAPES) ×2 IMPLANT
BAG COUNTER SPONGE SURGICOUNT (BAG) ×4 IMPLANT
BAG SPNG CNTER NS LX DISP (BAG) ×3
BAND INSRT 18 STRL LF DISP RB (MISCELLANEOUS) ×2
BAND RUBBER #18 3X1/16 STRL (MISCELLANEOUS) ×4 IMPLANT
BENZOIN TINCTURE PRP APPL 2/3 (GAUZE/BANDAGES/DRESSINGS) ×2 IMPLANT
BLADE RAD40 ROTATE 4M 4 5PK (BLADE) IMPLANT
BLADE ROTATE TRICUT 4X13 M4 (BLADE) ×2 IMPLANT
BLADE SURG 10 STRL SS (BLADE) ×2 IMPLANT
BLADE SURG 11 STRL SS (BLADE) ×4 IMPLANT
BLADE SURG 15 STRL LF DISP TIS (BLADE) ×4 IMPLANT
BLADE SURG 15 STRL SS (BLADE) ×2
BUR DIAMOND 13X5 70D (BURR) IMPLANT
BUR DIAMOND CURV 15X5 15D (BURR) IMPLANT
BUR TAPER CHOANAL ATRESIA 30K (BURR) ×2 IMPLANT
CABLE BIPOLOR RESECTION CORD (MISCELLANEOUS) ×2 IMPLANT
CANISTER SUCT 3000ML PPV (MISCELLANEOUS) ×6 IMPLANT
CATH FOLEY LATEX FREE 16FR (CATHETERS) ×1
CATH FOLEY LF 16FR (CATHETERS) IMPLANT
COAGULATOR SUCT SWTCH 10FR 6 (ELECTROSURGICAL) IMPLANT
COVER BACK TABLE 60X90IN (DRAPES) IMPLANT
COVER MAYO STAND STRL (DRAPES) ×2 IMPLANT
DEFOGGER MIRROR 1QT (MISCELLANEOUS) ×2 IMPLANT
DRAPE HALF SHEET 40X57 (DRAPES) ×4 IMPLANT
DRAPE INCISE IOBAN 66X45 STRL (DRAPES) ×2 IMPLANT
DRAPE MICROSCOPE SLANT 54X150 (MISCELLANEOUS) IMPLANT
DRAPE SURG 17X23 STRL (DRAPES) ×6 IMPLANT
DRESSING NASAL POPE 10X1.5X2.5 (GAUZE/BANDAGES/DRESSINGS) IMPLANT
DRSG NASAL POPE 10X1.5X2.5 (GAUZE/BANDAGES/DRESSINGS)
DURAPREP 26ML APPLICATOR (WOUND CARE) ×2 IMPLANT
ELECT COATED BLADE 2.86 ST (ELECTRODE) IMPLANT
ELECT NDL TIP 2.8 STRL (NEEDLE) ×2 IMPLANT
ELECT NEEDLE TIP 2.8 STRL (NEEDLE) ×1 IMPLANT
ELECT REM PT RETURN 9FT ADLT (ELECTROSURGICAL) ×2
ELECTRODE NDL INSULATED 6.5 (ELECTROSURGICAL) IMPLANT
ELECTRODE REM PT RTRN 9FT ADLT (ELECTROSURGICAL) ×4 IMPLANT
GAUZE PACKING FOLDED 2  STR (GAUZE/BANDAGES/DRESSINGS) ×1
GAUZE PACKING FOLDED 2 STR (GAUZE/BANDAGES/DRESSINGS) ×2 IMPLANT
GAUZE SPONGE 2X2 8PLY STRL LF (GAUZE/BANDAGES/DRESSINGS) ×2 IMPLANT
GAUZE SPONGE 2X2 STRL 8-PLY (GAUZE/BANDAGES/DRESSINGS) IMPLANT
GAUZE SPONGE 4X4 12PLY STRL (GAUZE/BANDAGES/DRESSINGS) ×2 IMPLANT
GLOVE BIO SURGEON STRL SZ 6.5 (GLOVE) ×2 IMPLANT
GLOVE BIO SURGEON STRL SZ7.5 (GLOVE) IMPLANT
GLOVE BIOGEL PI IND STRL 7.5 (GLOVE) ×4 IMPLANT
GLOVE ECLIPSE 7.0 STRL STRAW (GLOVE) ×2 IMPLANT
GLOVE EXAM NITRILE XL STR (GLOVE) IMPLANT
GOWN STRL REUS W/ TWL LRG LVL3 (GOWN DISPOSABLE) ×4 IMPLANT
GOWN STRL REUS W/ TWL XL LVL3 (GOWN DISPOSABLE) IMPLANT
GOWN STRL REUS W/TWL 2XL LVL3 (GOWN DISPOSABLE) ×2 IMPLANT
GOWN STRL REUS W/TWL LRG LVL3 (GOWN DISPOSABLE) ×2
GOWN STRL REUS W/TWL XL LVL3 (GOWN DISPOSABLE)
GRAFT DURAGEN MATRIX 1WX1L (Tissue) IMPLANT
HEMOSTAT ARISTA ABSORB 3G PWDR (HEMOSTASIS) IMPLANT
HEMOSTAT POWDER KIT SURGIFOAM (HEMOSTASIS) ×2 IMPLANT
HEMOSTAT SURGICEL 2X14 (HEMOSTASIS) IMPLANT
IV NS 1000ML (IV SOLUTION) ×2
IV NS 1000ML BAXH (IV SOLUTION) ×4 IMPLANT
KIT BASIN OR (CUSTOM PROCEDURE TRAY) ×4 IMPLANT
KIT DRAIN CSF ACCUDRAIN (MISCELLANEOUS) IMPLANT
KIT TURNOVER KIT B (KITS) ×4 IMPLANT
KNIFE ARACHNOID DISP AM-21-S (BLADE) IMPLANT
NDL HYPO 25GX1X1/2 BEV (NEEDLE) ×4 IMPLANT
NDL HYPO 25X1 1.5 SAFETY (NEEDLE) ×2 IMPLANT
NDL SPNL 22GX3.5 QUINCKE BK (NEEDLE) ×2 IMPLANT
NDL SPNL 25GX3.5 QUINCKE BL (NEEDLE) ×2 IMPLANT
NEEDLE HYPO 25GX1X1/2 BEV (NEEDLE) ×2 IMPLANT
NEEDLE HYPO 25X1 1.5 SAFETY (NEEDLE) ×1 IMPLANT
NEEDLE SPNL 22GX3.5 QUINCKE BK (NEEDLE) ×1 IMPLANT
NEEDLE SPNL 25GX3.5 QUINCKE BL (NEEDLE) ×1 IMPLANT
NS IRRIG 1000ML POUR BTL (IV SOLUTION) ×4 IMPLANT
PAD ARMBOARD 7.5X6 YLW CONV (MISCELLANEOUS) ×6 IMPLANT
PATTIES SURGICAL .25X.25 (GAUZE/BANDAGES/DRESSINGS) IMPLANT
PATTIES SURGICAL .5 X.5 (GAUZE/BANDAGES/DRESSINGS) IMPLANT
PATTIES SURGICAL .5 X3 (DISPOSABLE) ×4 IMPLANT
PENCIL BUTTON HOLSTER BLD 10FT (ELECTRODE) ×2 IMPLANT
SEALANT ADHERUS EXTEND TIP (MISCELLANEOUS) IMPLANT
SHEATH ENDOSCRUB 0 DEG (SHEATH) ×2 IMPLANT
SHEATH ENDOSCRUB 30 DEG (SHEATH) IMPLANT
SHEATH ENDOSCRUB 45 DEG (SHEATH) IMPLANT
SOL ANTI FOG 6CC (MISCELLANEOUS) IMPLANT
SOL ELECTROSURG ANTI STICK (MISCELLANEOUS) ×1
SOLUTION ELECTROSURG ANTI STCK (MISCELLANEOUS) ×2 IMPLANT
SPECIMEN JAR SMALL (MISCELLANEOUS) IMPLANT
SPLINT NASAL DOYLE BI-VL (GAUZE/BANDAGES/DRESSINGS) IMPLANT
SPLINT NASAL POSISEP X .6X2 (GAUZE/BANDAGES/DRESSINGS) IMPLANT
SPLINT NASAL POSISEP X2 .8X2.3 (GAUZE/BANDAGES/DRESSINGS) IMPLANT
SPONGE SURGIFOAM ABS GEL 100 (HEMOSTASIS) IMPLANT
SPONGE SURGIFOAM ABS GEL 12-7 (HEMOSTASIS) IMPLANT
SPONGE T-LAP 4X18 ~~LOC~~+RFID (SPONGE) ×2 IMPLANT
STAPLER SKIN PROX WIDE 3.9 (STAPLE) ×2 IMPLANT
STRIP CLOSURE SKIN 1/2X4 (GAUZE/BANDAGES/DRESSINGS) ×2 IMPLANT
SUCTION FRAZIER HANDLE 10FR (MISCELLANEOUS) ×1
SUCTION TUBE FRAZIER 10FR DISP (MISCELLANEOUS) ×2 IMPLANT
SUT BONE WAX W31G (SUTURE) ×2 IMPLANT
SUT ETHILON 3 0 FSL (SUTURE) IMPLANT
SUT ETHILON 3 0 PS 1 (SUTURE) IMPLANT
SUT ETHILON 6 0 P 1 (SUTURE) IMPLANT
SUT PDS AB 4-0 RB1 27 (SUTURE) IMPLANT
SUT PDS PLUS AB 5-0 RB-1 (SUTURE) IMPLANT
SUT PLAIN 4 0 ~~LOC~~ 1 (SUTURE) IMPLANT
SUT VIC AB 4-0 P-3 18X BRD (SUTURE) IMPLANT
SUT VIC AB 4-0 P3 18 (SUTURE)
SYR CONTROL 10ML LL (SYRINGE) ×2 IMPLANT
SYR TB 1ML LUER SLIP (SYRINGE) ×4 IMPLANT
TIP SUCTION MALLEABLE LG 20CM (ENT DISPOSABLE) IMPLANT
TOWEL GREEN STERILE (TOWEL DISPOSABLE) ×2 IMPLANT
TOWEL GREEN STERILE FF (TOWEL DISPOSABLE) ×4 IMPLANT
TRACKER ENT INSTRUMENT (MISCELLANEOUS) ×4 IMPLANT
TRACKER ENT PATIENT (MISCELLANEOUS) ×2 IMPLANT
TRAP SPECIMEN MUCUS 40CC (MISCELLANEOUS) IMPLANT
TRAY ENT MC OR (CUSTOM PROCEDURE TRAY) ×4 IMPLANT
TRAY FOLEY MTR SLVR 16FR STAT (SET/KITS/TRAYS/PACK) ×2 IMPLANT
TUBE CONNECTING 12X1/4 (SUCTIONS) ×2 IMPLANT
TUBING EXTENTION W/L.L. (IV SETS) IMPLANT
TUBING FEATHERFLOW (TUBING) IMPLANT
TUBING STRAIGHTSHOT EPS 5PK (TUBING) ×2 IMPLANT
WATER STERILE IRR 1000ML POUR (IV SOLUTION) ×2 IMPLANT

## 2022-08-04 NOTE — Anesthesia Postprocedure Evaluation (Signed)
Anesthesia Post Note  Patient: Rokia L Isaacks  Procedure(s) Performed: ENDOSCOPIC TRANSNASAL TRANSPHENOID RESECTION OF TUMOR TRANSPHENOIDAL APPROACH EXPOSURE     Patient location during evaluation: PACU Anesthesia Type: General Level of consciousness: awake and alert Pain management: pain level controlled Vital Signs Assessment: post-procedure vital signs reviewed and stable Respiratory status: spontaneous breathing, nonlabored ventilation and respiratory function stable Cardiovascular status: blood pressure returned to baseline and stable Postop Assessment: no apparent nausea or vomiting Anesthetic complications: yes  Encounter Notable Events  Notable Event Outcome Phase Comment  Difficult to intubate - unexpected  Intraprocedure Filed from anesthesia note documentation.    Last Vitals:  Vitals:   08/04/22 1247 08/04/22 1300  BP:  (!) 132/93  Pulse:  77  Resp:  10  Temp: 36.7 C   SpO2:  97%    Last Pain:  Vitals:   08/04/22 1320  TempSrc:   PainSc: 4                  Brookes Craine,W. EDMOND

## 2022-08-04 NOTE — Discharge Instructions (Signed)
[facility] ([facility]) Post Operative Instructions  Office: [phone]  The Surgery Itself Patients may be sedated for several hours after surgery and may remain sleepy for the better part of the day. Nausea and vomiting are occasionally seen, and usually resolve by the evening of surgery - even without additional medications.  After Surgery  Facial pressure and fullness similar to a sinus infection/headache is normal after surgery. Breathing through your nose is also difficult due to swelling. A humidifier or vaporizer can be used in the bedroom to prevent throat pain with mouth breathing.   Bloody nasal drainage is normal after this surgery for 5-7 days, usually decreasing in volume with each day that passes. Drainage will flow from the front of the nose and down the back of the throat. Make sure you spit out blood drainage that drips down the back of your throat to prevent nausea/vomiting. You will have a nasal drip pad/sling with gauze to catch drainage from the front of your nose. The dressing may need to be changed frequently during the first 24 hours following surgery. In case of profuse nasal bleeding, you may apply ice to the bridge of the nose and pinch the nose just above the tip and hold for 10 minutes; if bleeding continues, contact the doctors office.   Frequent hot showers or saline nasal rinses (NeilMed) will help break up congestion and clear any clot or mucus that builds up within the nose after surgery. This can be started the day after surgery.   It is more comfortable to sleep with extra pillows or in a recliner for the first few days after surgery until the drainage begins to resolve.    Do not blow your nose for 2 weeks after surgery.   Avoid lifting > 10 lbs. and no vigorous exercise for 2 weeks after surgery.   Avoid airplane travel for 2 weeks following sinus surgery; the cabin pressure changes can cause pain and swelling within the nose/sinuses.    Sense of smell and taste are often diminished for several weeks after surgery. There may be some tenderness or numbness in your upper front teeth, which is normal after surgery. You may express old clot, discolored mucus or very large nasal crusts from your nose for up to 3-4 weeks after surgery; depending on how frequently and how effectively you irrigate your nose with the saltwater spray.   You may have absorbable sutures inside of your nose after surgery that will slowly dissolve in 2-3 weeks. Be careful when clearing crusts from the nose since they may be attached to these sutures.  Medications  Pain medication can be used for pain as prescribed. Pain and pressure in the nose is expected after surgery. As the surgical site heals, pain will resolve over the course of a week. Pain medications can cause nausea, which can be prevented if you take them with food or milk.   You may be given an antibiotic for one week after surgery to prevent infection. Take this medication with food to prevent nausea or vomiting.   You can use 2 nasal sprays after surgery: Afrin can be used up to 2 times a day for up to 5 days after surgery (best before bed) to reduce bloody drainage from the nose for the first few days after surgery. Saline/salt water spray should be used at least 4-6 times per day, starting the day after surgery to prevent crusting inside of the nose.   Take all of your  routine medications as prescribed, unless told otherwise by your surgeon. Any medications that thin the blood should be avoided. This includes aspirin. Avoid aspirin-like products for the first 72 hours after surgery (Advil, Motrin, Excedrin, Alieve, Celebrex, Naprosyn), but you may use them as needed for pain after 72 hours.

## 2022-08-04 NOTE — Anesthesia Procedure Notes (Signed)
Procedure Name: Intubation Date/Time: 08/04/2022 8:36 AM  Performed by: Vincent Peyer, RNPre-anesthesia Checklist: Patient identified, Emergency Drugs available, Suction available and Patient being monitored Patient Re-evaluated:Patient Re-evaluated prior to induction Oxygen Delivery Method: Circle System Utilized Preoxygenation: Pre-oxygenation with 100% oxygen Induction Type: IV induction Ventilation: Mask ventilation without difficulty Laryngoscope Size: Mac and 3 Grade View: Grade II Tube type: Oral Tube size: 7.0 mm Number of attempts: 1 Airway Equipment and Method: Stylet Placement Confirmation: ETT inserted through vocal cords under direct vision, positive ETCO2 and breath sounds checked- equal and bilateral Secured at: 22 cm Tube secured with: Tape Dental Injury: Teeth and Oropharynx as per pre-operative assessment  Difficulty Due To: Difficulty was unanticipated Comments: Performed by Elson Areas SRNA

## 2022-08-04 NOTE — H&P (Signed)
Chief Complaint   Pituitary tumor  History of Present Illness  Sherri Castaneda is a 37 year old woman I am seeing after recent visit to the emergency department. Briefly, the patient felt that her eye glasses prescription was changing as she had not had an eye exam in a few years. She went to her eye doctor where routine eye exam revealed the presence of papilledema. She was therefore sent to the emergency department where MRI did reveal the presence of a pituitary tumor. She was then referred for outpatient surgical follow-up. Upon questioning, the patient does report onset of headaches relatively acutely during childbirth about four months ago. Since then, she has had some headaches although these appear to be relieved with over-the-counter Tylenol and Motrin. Occasionally, she does require a migraine abortive medicine which she has gotten from her primary doctor. She has not noted any specific change in her vision, or any tunnel vision.  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  Of note, the patient does not report any history of hypertension, does report a history of prediabetes although her hemoglobin A1c she reports less than five. No history of previous heart disease or stroke. No known lung, liver, or kidney disease. She is not on any blood thinners or antiplatelet agents. No cancer history. She is a nonsmoker.   Past Medical History   Past Medical History:  Diagnosis Date   GERD (gastroesophageal reflux disease)    Gestational Diabetes    Headache    History of kidney stones    2007   Hypertension    PONV (postoperative nausea and vomiting)    Prediabetes    Preterm labor    Delivery at 32 weeks and 34 weeks.   Seasonal allergies    Vaginal Pap smear, abnormal     Past Surgical History   Past Surgical History:  Procedure Laterality Date   GYNECOLOGIC  CRYOSURGERY  2006   KNEE ARTHROSCOPY Right     Social History   Social History   Tobacco Use   Smoking status: Never   Smokeless tobacco: Never  Vaping Use   Vaping Use: Never used  Substance Use Topics   Alcohol use: No   Drug use: No    Medications   Prior to Admission medications   Medication Sig Start Date End Date Taking? Authorizing Provider  butalbital-acetaminophen-caffeine (FIORICET) 50-325-40 MG tablet Take 2 tablets by mouth every 6 (six) hours as needed for headache. 04/12/22  Yes Dillard, Naima, MD  fluticasone (FLONASE) 50 MCG/ACT nasal spray Place 2 sprays into both nostrils daily. 07/22/22  Yes [provider]  NIFEdipine (PROCARDIA-XL/NIFEDICAL-XL) 30 MG 24 hr tablet Take 30 mg (1 tablet) in the morning, take 15 mg (half tablet) at night 08/02/22  Yes Tobb, Kardie, DO  pantoprazole (PROTONIX) 40 MG tablet Take 40 mg by mouth daily before breakfast.   Yes [provider]  ferrous sulfate 325 (65 FE) MG tablet Take 1 tablet (325 mg total) by mouth every other day. Patient taking differently: Take 325 mg by mouth daily with breakfast. 10/19/21   Woodroe Mode, MD  guaiFENesin-dextromethorphan (ROBITUSSIN DM) 100-10 MG/5ML syrup Take 10 mLs by mouth every 4 (four) hours as needed for cough. Patient not taking: Reported on 08/02/2022 06/08/22   Hosie Poisson, MD  loratadine (CLARITIN) 10 MG tablet Take 1 tablet (10 mg total) by mouth daily. 06/09/22   Hosie Poisson, MD  ondansetron (ZOFRAN) 4 MG tablet Take 1 tablet (4 mg total) by mouth every 8 (eight) hours as needed for nausea or vomiting. Patient not taking: Reported on 08/02/2022 06/08/22   Hosie Poisson, MD  phenylephrine (NEO-SYNEPHRINE) 1 % nasal spray Place 1 drop into both nostrils every 6 (six) hours as needed for congestion. Patient not taking: Reported on 08/02/2022 06/08/22   Hosie Poisson, MD  trimethoprim-polymyxin b (POLYTRIM) ophthalmic solution Place 2 drops into both eyes every 4 (four)  hours. Patient not taking: Reported on 08/02/2022 06/25/22   Chevis Pretty, FNP  TYLENOL SINUS SEVERE 5-325-200 MG TABS Take 1 tablet by mouth every 6 (six) hours as needed (for cold-like symptoms). Patient not taking: Reported on 08/02/2022    [provider]    Allergies   Allergies  Allergen Reactions   Flagyl [Metronidazole] Swelling and Other (See Comments)    Tongue tingles   Pineapple Itching, Swelling and Other (See Comments)    Throat tingles   Latex Itching and Rash    Review of Systems  ROS  Neurologic Exam  Awake, alert, oriented Memory and concentration grossly intact Speech fluent, appropriate CN grossly intact Motor exam: Upper Extremities Deltoid Bicep Tricep Grip  Right 5/5 5/5 5/5 5/5  Left 5/5 5/5 5/5 5/5   Lower Extremities IP Quad PF DF EHL  Right 5/5 5/5 5/5 5/5 5/5  Left 5/5 5/5 5/5 5/5 5/5   Sensation grossly intact to LT  Imaging  MRI reveals pituitary tumor with suprasellar extension and compression of the optic apparatus  Impression  - 37 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 We have also reviewed in detail the risks, benefits, and alternatives to the procedure. All questions were answered and Sherri Castaneda provided informed consent to proceed.  Consuella Lose, MD [facility]

## 2022-08-04 NOTE — Op Note (Signed)
OPERATIVE NOTE  Sherri Castaneda Date/Time of Admission: 08/04/2022  5:36 AM  CSN: [id number] Attending Provider: Consuella Lose, MD Room/Bed: MCPO/NONE DOB: December 30, 1985 Age: 37 y.o.   Pre-Op Diagnosis: PITUITARY TUMOR  Post-Op Diagnosis: PITUITARY TUMOR  Procedure: Procedure(s): ENDOSCOPIC TRANSNASAL TRANSPHENOID RESECTION OF TUMOR TRANSPHENOIDAL APPROACH EXPOSURE  Anesthesia: General  Surgeon(s): Jadarrius Maselli A Analissa Bayless, DO Consuella Lose, MD  Staff: Circulator: Cox, Ronie Spies, RN; Pinnix, Jerl Santos, RN Scrub Person: Lula Olszewski, CST; Dollene Cleveland T Circulator Assistant: Arnoldo Morale, RN; Rometta Emery, RN  Implants: Implant Name Type Inv. Item Serial No. Manufacturer Lot No. LRB No. Used Action  GRAFT DURAGEN MATRIX [id number] - [id number] Tissue GRAFT DURAGEN MATRIX [id number]  [employer] [id number] N/A 1 Implanted    Specimens: ID Type Source Tests Collected by Time Destination  1 : Pituitary Tumor Brain Brain SURGICAL PATHOLOGY Consuella Lose, MD [id number]     Complications: None  EBL: 75 ML  Condition: stable  Operative Findings:  Mild septal deviation, expanded sella turcica  Description of Operation: Once operative consent was obtained and the site and surgery were confirmed with the patient and the operating room team, the patient was brought back to the operating room and general endotracheal anesthesia was obtained. The patient was then turned over to the ENT service, at which time the image-guided system was attached and noted to be in good calibration. Lidocaine 1% with 1:100,000 epinephrine was injected into the nasal septum bilaterally, inferior turbinates bilaterally, the middle turbinates bilaterally, and the axilla between the medial turbinate and the lateral nasal wall. Afrin-soaked pledgets were placed into the nasal cavity, and the patient was prepped and draped in sterile fashion. Attention was first turned to the  right nasal vestibule.  The inferior and middle turbinate were gently lateralized using a Cottle elevator until the superior turbinate was identified.  This was also lateralized until the natural sinus ostium was appreciated.  This was serially dilated using a mushroom punch and an up-biting Kerrison.  A posterior septectomy was then performed using a combination of a Cottle elevator, Tru-Cut forceps and Kerrisons.  The mucosa overlying the face of the sphenoid was gently dissected and preserved for a possible septal flap.The left middle turbinate and superior turbinate were then identified and gently lateralized until the natural sphenoid os was identified and serially dilated using the sphenoid dilator, mushroom punch and up-biting Kerrisons.  With the bilateral sphenoid cavities in view, the remaining bone of the sphenoid face was removed using Kerrison forceps.  A diamond bur drill was also used to complete the bony resection and to remove the intrasinus septum until the bone overlying the pituitary adenoma was completely visualized.  This bone was removed using Kerrison forceps until the tumor was completely visualized. Dr. Merdis Delay then scrubbed in and completely resected the tumor, please see his operative note for details regarding the repair.  There was a small CSF leak that occurred during tumor removal. This was repaired using Floseal, duragen, and then Adheris was used to completely cover the duragen. There was no evidence of persistent leak following the repair.  Gelfoam, Arista, and Posisep nasal packing was placed in bilateral nasal cavities in the sphenoid defect. An orogastric tube was placed and the stomach cavity was suctioned to reduce postoperative nausea. The patient was turned over to anesthesia service and was extubated in the operating room and transferred to the PACU in stable condition.    [name], [facility]  08/04/2022

## 2022-08-04 NOTE — Anesthesia Procedure Notes (Signed)
Arterial Line Insertion Start/End4/08/2022 8:40 AM, 08/04/2022 8:45 AM Performed by: Colin Benton, CRNA, CRNA  Patient location: OR. Left, radial was placed Catheter size: 20 G Hand hygiene performed , maximum sterile barriers used  and Seldinger technique used Allen's test indicative of satisfactory collateral circulation Procedure performed without using ultrasound guided technique. Following insertion, dressing applied and Biopatch. Post procedure assessment: normal and unchanged  Patient tolerated the procedure well with no immediate complications.

## 2022-08-04 NOTE — Anesthesia Preprocedure Evaluation (Addendum)
Anesthesia Evaluation  Patient identified by MRN, date of birth, ID band Patient awake    Reviewed: Allergy & Precautions, H&P , NPO status , Patient's Chart, lab work & pertinent test results  History of Anesthesia Complications (+) PONV and history of anesthetic complications  Airway Mallampati: II  TM Distance: >3 FB Neck ROM: Full    Dental no notable dental hx. (+) Teeth Intact, Dental Advisory Given   Pulmonary neg pulmonary ROS   Pulmonary exam normal breath sounds clear to auscultation       Cardiovascular hypertension, Pt. on medications  Rhythm:Regular Rate:Normal     Neuro/Psych  Headaches  negative psych ROS   GI/Hepatic Neg liver ROS,GERD  Medicated,,  Endo/Other  diabetes    Renal/GU negative Renal ROS  negative genitourinary   Musculoskeletal   Abdominal   Peds  Hematology negative hematology ROS (+)   Anesthesia Other Findings   Reproductive/Obstetrics negative OB ROS                             Anesthesia Physical Anesthesia Plan  ASA: 3  Anesthesia Plan: General   Post-op Pain Management: Tylenol PO (pre-op)*   Induction: Intravenous  PONV Risk Score and Plan: 4 or greater and Ondansetron, Dexamethasone and Midazolam  Airway Management Planned: Oral ETT  Additional Equipment: Arterial line  Intra-op Plan:   Post-operative Plan: Extubation in OR  Informed Consent: I have reviewed the patients History and Physical, chart, labs and discussed the procedure including the risks, benefits and alternatives for the proposed anesthesia with the patient or authorized representative who has indicated his/her understanding and acceptance.     Dental advisory given  Plan Discussed with: CRNA  Anesthesia Plan Comments:        Anesthesia Quick Evaluation

## 2022-08-04 NOTE — Transfer of Care (Signed)
Immediate Anesthesia Transfer of Care Note  Patient: Sherri Castaneda  Procedure(s) Performed: ENDOSCOPIC TRANSNASAL TRANSPHENOID RESECTION OF TUMOR TRANSPHENOIDAL APPROACH EXPOSURE  Patient Location: PACU  Anesthesia Type:General  Level of Consciousness: awake, oriented, and patient cooperative  Airway & Oxygen Therapy: Patient Spontanous Breathing  Post-op Assessment: Report given to RN and Post -op Vital signs reviewed and stable  Post vital signs: Reviewed and stable  Last Vitals:  Vitals Value Taken Time  BP 125/79 08/04/22 1141  Temp    Pulse 127 08/04/22 1144  Resp 17 08/04/22 1144  SpO2 97 % 08/04/22 1144  Vitals shown include unvalidated device data.  Last Pain:  Vitals:   08/04/22 0626  TempSrc:   PainSc: 0-No pain         Complications:  Encounter Notable Events  Notable Event Outcome Phase Comment  Difficult to intubate - unexpected  Intraprocedure Filed from anesthesia note documentation.

## 2022-08-05 ENCOUNTER — Encounter (HOSPITAL_COMMUNITY): Payer: Self-pay | Admitting: Neurosurgery

## 2022-08-05 LAB — BASIC METABOLIC PANEL
Anion gap: 9 (ref 5–15)
BUN: 6 mg/dL (ref 6–20)
CO2: 26 mmol/L (ref 22–32)
Calcium: 9.3 mg/dL (ref 8.9–10.3)
Chloride: 105 mmol/L (ref 98–111)
Creatinine, Ser: 0.65 mg/dL (ref 0.44–1.00)
GFR, Estimated: >60 mL/min (ref >60)
Glucose, Bld: 120 mg/dL — ABNORMAL HIGH (ref 70–99)
Potassium: 3.9 mmol/L (ref 3.5–5.1)
Sodium: 140 mmol/L (ref 135–145)

## 2022-08-05 LAB — CBC
HCT: 38.7 % (ref 36.0–46.0)
Hemoglobin: 12.1 g/dL (ref 12.0–15.0)
MCH: 25.2 pg — ABNORMAL LOW (ref 26.0–34.0)
MCHC: 31.3 g/dL (ref 30.0–36.0)
MCV: 80.5 fL (ref 80.0–100.0)
Platelets: 290 10*3/uL (ref 150–400)
RBC: 4.81 MIL/uL (ref 3.87–5.11)
RDW: 13.8 % (ref 11.5–15.5)
WBC: 10.9 10*3/uL — ABNORMAL HIGH (ref 4.0–10.5)
nRBC: 0 % (ref 0.0–0.2)

## 2022-08-05 LAB — CORTISOL-AM, BLOOD: Cortisol - AM: 12.5 ug/dL (ref 6.7–22.6)

## 2022-08-05 LAB — SODIUM
Sodium: 143 mmol/L (ref 135–145)
Sodium: 143 mmol/L (ref 135–145)

## 2022-08-05 MED FILL — Thrombin For Soln 20000 Unit: CUTANEOUS | Qty: 1 | Status: AC

## 2022-08-05 NOTE — Progress Notes (Signed)
Patient's urine output increasing and color is much clearer/lighter. MD made aware. Order for Q6 sodium checks and to monitor results. If sodium levels are within normal will not jump to DDAVP. Order to encourage fluid intake. Patient is already drinking a lot due to increased thirst. Monitoring closely. Patient's questions have been answered and told MD will be rounding a bit later today. Sherri Castaneda, [place] [facility]

## 2022-08-05 NOTE — Progress Notes (Signed)
  Transition of Care [facility]) Screening Note   Patient Details  Name: Sherri Castaneda Date of Birth: [birth year]   [facility]) CM/SW Contact:    Glennon Mac, RN Phone Number: 08/05/2022, 5:17 PM    Transition of Care Department [facility]) has reviewed patient and no TOC needs have been identified at this time. We will continue to monitor patient advancement through interdisciplinary progression rounds. If new patient transition needs arise, please place a TOC consult.  Quintella Baton, RN, BSN  Trauma/Neuro ICU Case Manager [phone]

## 2022-08-05 NOTE — Op Note (Signed)
  NEUROSURGERY OPERATIVE NOTE   PREOP DIAGNOSIS:  Pituitary macroadenoma   POSTOP DIAGNOSIS: Same  PROCEDURE: Endoscopic transnasal transsphenoidal resection of pituitary tumor Use of intraoperative stereotaxy  SURGEON: Dr. Lisbeth Renshaw, MD  CO-SURGEON: Dr. Leonia Corona, DO  ANESTHESIA: General Endotracheal  EBL: 150cc  SPECIMENS: Pituitary tumor for permanent pathology  DRAINS: None  COMPLICATIONS: None immediate  CONDITION: Hemodynamically stable to the postanesthesia care unit  HISTORY: Sherri Castaneda is a 37 y.o. female initially seen in the outpatient neurosurgery clinic after she was referred to the emergency department by her ophthalmologist for discovery of papilledema.  Workup in the emergency department included MRI scan revealing a pituitary lesion with suprasellar extension and compression of the optic apparatus.  Further workup included serum endocrine studies which were largely normal.  Given the patient's ophthalmologic findings and radiographic findings, surgical resection was recommended.  The risks, benefits, and alternatives to surgery were all reviewed in detail with the patient.  After all their questions were answered, informed consent was obtained and witnessed.  PROCEDURE IN DETAIL: The patient was brought to the operating room. After induction of general anesthesia, the patient was positioned on the operative table in the supine position. All pressure points were meticulously padded.  Skin of the mid face was then prepped and draped in the usual sterile fashion.  After timeout was conducted, approach to the sphenoid sinus and the anterior aspect of the sella was conducted by Dr. Marene Lenz, details of which are dictated in a separate operative report.  Once access to the sella was obtained, I opened the bone overlying the sella using a combination of Kerrison punches.  The dura on the anterior aspect of the sella was then coagulated with the bipolar and  incised sharply in cruciate fashion.  Soft, slightly purple tumor was immediately expressed.  Multiple samples were taken and sent for permanent pathology.  At this point, the tumor was slowly removed using a combination of ring curettes initially inferiorly and the sella until I was able to identify the dorsum sella.  Ring curettes were then used to remove tumor laterally abutting the medial edges of the cavernous sinus bilaterally.  Finally, up-angled curettes were used to restart to remove tumor superiorly which appeared to drop down quite well.  Tumor in the posterior aspect of the sella was removed and I was able to identify the diaphragma sella.  There did appear to be a small portion of residual tumor in the anterior aspect of the sella which I removed with a micropituitary.  Removal of this last piece of tumor created a very small opening in the arachnoid at the anterior edge of our opening.  The sella was inspected and no further tumor was identified.  Hemostasis was easily secured with morselized Gelfoam and thrombin.  We also placed a small piece of DuraGen onlay graft over the face of the sella.  This was then covered with a layer of polyethylene glycol sealant.  Once this was done, there was no identified CSF leak.  The transnasal approach was then closed by Dr. Marene Lenz, details again dictated separately.  At the end of the case all sponge, needle, and instrument counts were correct. The patient was then transferred to the stretcher, extubated, and taken to the post-anesthesia care unit in stable hemodynamic condition.   Lisbeth Renshaw, MD [facility]

## 2022-08-05 NOTE — Progress Notes (Signed)
  NEUROSURGERY PROGRESS NOTE   Pt seen and examined. No issues overnight. Pt reports primarily mucus nasal drainage, no thin/watery drainage. Reports improvement in vision compared to preop. UO increased this am.  EXAM: Temp:  [97.7 F (36.5 C)-98.8 F (37.1 C)] 98.7 F (37.1 C) (04/05 0800) Pulse Rate:  [63-105] 77 (04/05 1300) Resp:  [10-20] 10 (04/05 1300) BP: (125-150)/(78-99) 128/98 (04/05 1300) SpO2:  [95 %-98 %] 96 % (04/05 1300) Arterial Line BP: (165)/(83) 165/83 (04/04 1400) Intake/Output      04/04 0701 04/05 0700 04/05 0701 04/06 0700   P.O.  980   I.V. (mL/kg) 3112.1 (40.8) 520.3 (6.8)   IV Piggyback 150    Total Intake(mL/kg) 3262.1 (42.8) 1500.3 (19.7)   Urine (mL/kg/hr) 3375 (1.8) 2700 (5.1)   Blood 150    Total Output 3525 2700   Net -262.9 -1199.7         Awake, alert, oriented CN grossly intact MAE well  LABS: Lab Results  Component Value Date   CREATININE 0.65 08/05/2022   BUN 6 08/05/2022   NA 140 08/05/2022   K 3.9 08/05/2022   CL 105 08/05/2022   CO2 26 08/05/2022   Lab Results  Component Value Date   WBC 10.9 (H) 08/05/2022   HGB 12.1 08/05/2022   HCT 38.7 08/05/2022   MCV 80.5 08/05/2022   PLT 290 08/05/2022     IMPRESSION: - 37 y.o. female POD#1 transsphenoidal resection of pituitary tumor. Likely DI however Na normal this am. - AM Cortisol normal  PLAN: - Pt encouraged to maintain good PO intake of water when thirsty - Will get Q6hr Na - Cont Foley catheter for now - OOB today   Lisbeth Renshaw, MD [facility]

## 2022-08-06 LAB — SODIUM
Sodium: 141 mmol/L (ref 135–145)
Sodium: 141 mmol/L (ref 135–145)
Sodium: 141 mmol/L (ref 135–145)

## 2022-08-06 NOTE — Discharge Summary (Signed)
Physician Discharge Summary  Patient ID: Sherri Castaneda MRN: [id number] DOB/AGE: May 18, 1985 36 y.o.  Admit date: 08/04/2022 Discharge date: 08/06/2022  Admission Diagnoses:  Pituitary macroadenoma  Discharge Diagnoses:  Same Principal Problem:   Pituitary macroadenoma   Discharged Condition: Stable  Hospital Course:  Sherri Castaneda is a 37 y.o. female admitted after transsphenoidal resection of tumor. She was reporting imporved vision postop. She had transient increased UO but stable serum Na. She did not report any clear nasal drainage. She therefore requested d/c hom.  Treatments: Surgery - Transnasal transsphenoidal resection of pituitary tumor  Discharge Exam: Blood pressure (!) 136/90, pulse 73, temperature (!) 97.5 F (36.4 C), temperature source Oral, resp. rate (!) 9, height 5' (1.524 m), weight 76.2 kg, SpO2 95 %, currently breastfeeding. Awake, alert, oriented Speech fluent, appropriate CN grossly intact 5/5 BUE/BLE Wound c/d/i  Disposition: Discharge disposition: 01-Home or Self Care       Discharge Instructions     Call MD for:  redness, tenderness, or signs of infection (pain, swelling, redness, odor or green/yellow discharge around incision site)   Complete by: As directed    Call MD for:  temperature >100.4   Complete by: As directed    Diet - low sodium heart healthy   Complete by: As directed    Discharge instructions   Complete by: As directed    Walk at home as much as possible, at least 4 times / day   Increase activity slowly   Complete by: As directed    Lifting restrictions   Complete by: As directed    No lifting > 10 lbs   May shower / Bathe   Complete by: As directed    48 hours after surgery   May walk up steps   Complete by: As directed    No dressing needed   Complete by: As directed    Other Restrictions   Complete by: As directed    No bending/twisting at waist      Allergies as of 08/06/2022       Reactions   Flagyl  [metronidazole] Swelling, Other (See Comments)   Tongue tingles   Pineapple Itching, Swelling, Other (See Comments)   Throat tingles   Latex Itching, Rash        Medication List     STOP taking these medications    ferrous sulfate 325 (65 FE) MG tablet   guaiFENesin-dextromethorphan 100-10 MG/5ML syrup Commonly known as: ROBITUSSIN DM   loratadine 10 MG tablet Commonly known as: CLARITIN   ondansetron 4 MG tablet Commonly known as: Zofran   phenylephrine 1 % nasal spray Commonly known as: NEO-SYNEPHRINE   trimethoprim-polymyxin b ophthalmic solution Commonly known as: Polytrim       TAKE these medications    butalbital-acetaminophen-caffeine 50-325-40 MG tablet Commonly known as: FIORICET Take 2 tablets by mouth every 6 (six) hours as needed for headache.   fluticasone 50 MCG/ACT nasal spray Commonly known as: FLONASE Place 2 sprays into both nostrils daily.   NIFEdipine 30 MG 24 hr tablet Commonly known as: PROCARDIA-XL/NIFEDICAL-XL Take 30 mg (1 tablet) in the morning, take 15 mg (half tablet) at night What changed:  how much to take how to take this when to take this additional instructions   pantoprazole 40 MG tablet Commonly known as: PROTONIX Take 40 mg by mouth daily before breakfast.               Discharge Care Instructions  (From admission,  onward)           Start     Ordered   08/06/22 0000  No dressing needed        08/06/22 1024            Follow-up Information     Skotnicki, Meghan A, DO Follow up on 08/22/2022.   Specialty: Otolaryngology Why: Follow up as scheduled on 04/22 and 05/06 Contact information: [address] [place] [address] [phone]         Lisbeth Renshaw, MD Follow up.   Specialty: Neurosurgery Contact information: [address] [place] [address] [phone]                 Signed: Jackelyn Hoehn 08/06/2022, 10:25 AM

## 2022-08-06 NOTE — Progress Notes (Signed)
Pt d/c to home by car with family. Current sodium 141. Assessment stable. D/C instructions reviewed. All questions answered.

## 2022-08-06 NOTE — Progress Notes (Signed)
  NEUROSURGERY PROGRESS NOTE   Pt seen and examined. No issues overnight. Improved UO, pt does not report any further nasal drainage.  EXAM: Temp:  [97.5 F (36.4 C)-98.7 F (37.1 C)] 97.5 F (36.4 C) (04/06 0400) Pulse Rate:  [63-103] 73 (04/06 0900) Resp:  [9-19] 9 (04/06 0900) BP: (112-140)/(79-103) 136/90 (04/06 0900) SpO2:  [95 %-99 %] 95 % (04/06 0900) Intake/Output      04/05 0701 04/06 0700 04/06 0701 04/07 0700   P.O. 3260    I.V. (mL/kg) 1777.2 (23.3) 150.1 (2)   IV Piggyback     Total Intake(mL/kg) 5037.2 (66.1) 150.1 (2)   Urine (mL/kg/hr) 6750 (3.7) 350 (1.4)   Blood     Total Output 6750 350   Net -1712.8 -200         Awake, alert, oriented CN grossly intact MAE well  LABS: Lab Results  Component Value Date   CREATININE 0.65 08/05/2022   BUN 6 08/05/2022   NA 141 08/06/2022   K 3.9 08/05/2022   CL 105 08/05/2022   CO2 26 08/05/2022   Lab Results  Component Value Date   WBC 10.9 (H) 08/05/2022   HGB 12.1 08/05/2022   HCT 38.7 08/05/2022   MCV 80.5 08/05/2022   PLT 290 08/05/2022     IMPRESSION: - 37 y.o. female POD#2 transsphenoidal resection of pituitary tumor. Transient postop DI appears to have significantly improved. Na stable. - No CSF rhinorrhea noted  PLAN: - Pt again encouraged to maintain good PO intake of water when thirsty - Cont Q6hr Na for today - d/c Foley this am - May be able to d/c home this pm.   Lisbeth Renshaw, MD [facility]

## 2022-08-08 LAB — SURGICAL PATHOLOGY

## 2022-08-22 ENCOUNTER — Encounter: Payer: Self-pay | Admitting: Cardiology

## 2022-10-24 ENCOUNTER — Ambulatory Visit: Payer: 59 | Attending: Cardiology | Admitting: Cardiology

## 2022-10-24 ENCOUNTER — Encounter: Payer: Self-pay | Admitting: Cardiology

## 2022-10-24 VITALS — BP 128/88 | HR 78 | Ht 60.0 in | Wt 175.0 lb

## 2022-10-24 DIAGNOSIS — Z8759 Personal history of other complications of pregnancy, childbirth and the puerperium: Secondary | ICD-10-CM | POA: Diagnosis not present

## 2022-10-24 DIAGNOSIS — O165 Unspecified maternal hypertension, complicating the puerperium: Secondary | ICD-10-CM

## 2022-10-24 DIAGNOSIS — O1415 Severe pre-eclampsia, complicating the puerperium: Secondary | ICD-10-CM

## 2022-10-24 DIAGNOSIS — R7303 Prediabetes: Secondary | ICD-10-CM | POA: Diagnosis not present

## 2022-10-24 DIAGNOSIS — I10 Essential (primary) hypertension: Secondary | ICD-10-CM | POA: Diagnosis not present

## 2022-10-24 MED ORDER — AMLODIPINE BESYLATE 2.5 MG PO TABS: 2.5000 mg | ORAL_TABLET | Freq: Every day | ORAL | 3 refills | Status: AC

## 2022-10-24 NOTE — Patient Instructions (Signed)
Medication Instructions:  Your physician has recommended you make the following change in your medication:  START: Amlodipine 2.5 mg once daily Please take your blood pressure daily for 2 weeks and send in a MyChart message. Please include heart rates.   HOW TO TAKE YOUR BLOOD PRESSURE: Rest 5 minutes before taking your blood pressure. Don't smoke or drink caffeinated beverages for at least 30 minutes before. Take your blood pressure before (not after) you eat. Sit comfortably with your back supported and both feet on the floor (don't cross your legs). Elevate your arm to heart level on a table or a desk. Use the proper sized cuff. It should fit smoothly and snugly around your bare upper arm. There should be enough room to slip a fingertip under the cuff. The bottom edge of the cuff should be 1 inch above the crease of the elbow. Ideally, take 3 measurements at one sitting and record the average.  *If you need a refill on your cardiac medications before your next appointment, please call your pharmacy*   Lab Work: None If you have labs (blood work) drawn today and your tests are completely normal, you will receive your results only by: MyChart Message (if you have MyChart) OR A paper copy in the mail If you have any lab test that is abnormal or we need to change your treatment, we will call you to review the results.   Testing/Procedures: None   Follow-Up: At [facility], you and your health needs are our priority.  As part of our continuing mission to provide you with exceptional heart care, we have created designated Provider Care Teams.  These Care Teams include your primary Cardiologist (physician) and Advanced Practice Providers (APPs -  Physician Assistants and Nurse Practitioners) who all work together to provide you with the care you need, when you need it.   Your next appointment:   16 week(s)  Provider:   Thomasene Ripple, DO

## 2022-10-24 NOTE — Progress Notes (Signed)
Cardio-Obstetrics Clinic  New Evaluation  Date:  10/24/2022   ID:  Sherri Castaneda Sep 14, 1985, MRN [id number]  PCP:  Chyrel Masson   [facility] Providers Cardiologist:  Thomasene Ripple, DO  Electrophysiologist:  None       Referring MD: Virgilio Belling, PA-C   Chief Complaint: " my pressure has been elevated"  History of Present Illness:    Sherri Castaneda is a 37 y.o. female [G5P3205] who is being seen today for the evaluation of chronic hypertension in pregnancy at the request of Earl Gala, Tirrany T, PA-C.   Medical history includes prediabetes, postpartum hypertension, recently diagnosed pituitary tumor which is planned for surgery this Thursday.  At her first visit with me she shared that she was diagnosed with postpartum preeclampsia 2 weeks after she had her baby who is 28 months old.  During which time she was worked up and also was found to have pituitary tumor after the evaluation of this tumor she did also see ENT plans for surgery on Thursday.  During that visit she was hypertensive I increase her nifedipine.  Since I saw the fifth she had she has undergone her ENT surgery and she is doing much better in that night.  She offers no complaints at this time.  She has been off the nifedipine.  Her blood pressure has been within target she tells me.  Prior CV Studies Reviewed: The following studies were reviewed today:   Past Medical History:  Diagnosis Date   GERD (gastroesophageal reflux disease)    Gestational Diabetes    Headache    History of kidney stones    2007   Hypertension    PONV (postoperative nausea and vomiting)    Prediabetes    Preterm labor    Delivery at 32 weeks and 34 weeks.   Seasonal allergies    Vaginal Pap smear, abnormal     Past Surgical History:  Procedure Laterality Date   CRANIOTOMY N/A 08/04/2022   Procedure: ENDOSCOPIC TRANSNASAL TRANSPHENOID RESECTION OF TUMOR;  Surgeon: Lisbeth Renshaw, MD;  Location: [facility]  OR;  Service: Neurosurgery;  Laterality: N/A;   GYNECOLOGIC CRYOSURGERY  2006   KNEE ARTHROSCOPY Right    TRANSPHENOIDAL APPROACH EXPOSURE N/A 08/04/2022   Procedure: TRANSPHENOIDAL APPROACH EXPOSURE;  Surgeon: Marene Lenz, Meghan A, DO;  Location: [facility] OR;  Service: ENT;  Laterality: N/A;      OB History     Gravida  5   Para  5   Term  3   Preterm  2   AB      Living  5      SAB      IAB      Ectopic      Multiple  0   Live Births  5               Current Medications: Current Meds  Medication Sig   amLODipine (NORVASC) 2.5 MG tablet Take 1 tablet (2.5 mg total) by mouth daily.   butalbital-acetaminophen-caffeine (FIORICET) 50-325-40 MG tablet Take 2 tablets by mouth every 6 (six) hours as needed for headache.   fluticasone (FLONASE) 50 MCG/ACT nasal spray Place 2 sprays into both nostrils daily.   pantoprazole (PROTONIX) 40 MG tablet Take 40 mg by mouth daily before breakfast.   [DISCONTINUED] NIFEdipine (PROCARDIA-XL/NIFEDICAL-XL) 30 MG 24 hr tablet Take 30 mg (1 tablet) in the morning, take 15 mg (half tablet) at night (Patient taking differently: Take 15-30 mg by  mouth See admin instructions. Take 30 mg by mouth in the morning. Take 15 mg by mouth at night.)     Allergies:   Flagyl [metronidazole], Pineapple, and Latex   Social History   Socioeconomic History   Marital status: Married    Spouse name: Not on file   Number of children: Not on file   Years of education: Not on file   Highest education level: Not on file  Occupational History   Not on file  Tobacco Use   Smoking status: Never   Smokeless tobacco: Never  Vaping Use   Vaping Use: Never used  Substance and Sexual Activity   Alcohol use: No   Drug use: No   Sexual activity: Yes    Partners: Male    Birth control/protection: Condom, None  Other Topics Concern   Not on file  Social History Narrative   Not on file   Social Determinants of Health   Financial Resource Strain: Not on  file  Food Insecurity: No Food Insecurity (06/08/2022)   Hunger Vital Sign    Worried About Running Out of Food in the Last Year: Never true    Ran Out of Food in the Last Year: Never true  Transportation Needs: No Transportation Needs (06/08/2022)   PRAPARE - Administrator, Civil Service (Medical): No    Lack of Transportation (Non-Medical): No  Physical Activity: Not on file  Stress: Not on file  Social Connections: Not on file      Family History  Problem Relation Age of Onset   Cancer Mother    Cancer Father    Hypertension Maternal Uncle    Cancer Maternal Grandmother    Asthma Neg Hx    Birth defects Neg Hx    Diabetes Neg Hx    Heart disease Neg Hx       ROS:   Please see the history of present illness.    Headaches, palpitations All other systems reviewed and are negative.   Labs/EKG Reviewed:    EKG:   EKG is was not ordered today.    Recent Labs: 06/07/2022: TSH 0.433 07/05/2022: ALT 19 08/05/2022: BUN 6; Creatinine, Ser 0.65; Hemoglobin 12.1; Platelets 290; Potassium 3.9 08/06/2022: Sodium 141   Recent Lipid Panel No results found for: "CHOL", "TRIG", "HDL", "CHOLHDL", "LDLCALC", "LDLDIRECT"  Physical Exam:    VS:  BP 128/88 (BP Location: Right Arm)   Pulse 78   Ht 5' (1.524 m)   Wt 175 lb (79.4 kg)   SpO2 99%   BMI 34.18 kg/m     Wt Readings from Last 3 Encounters:  10/24/22 175 lb (79.4 kg)  08/04/22 168 lb (76.2 kg)  08/02/22 164 lb 3.2 oz (74.5 kg)     GEN:  Well nourished, well developed in no acute distress HEENT: Normal NECK: No JVD; No carotid bruits LYMPHATICS: No lymphadenopathy CARDIAC: RRR, no murmurs, rubs, gallops RESPIRATORY:  Clear to auscultation without rales, wheezing or rhonchi  ABDOMEN: Soft, non-tender, non-distended MUSCULOSKELETAL:  No edema; No deformity  SKIN: Warm and dry NEUROLOGIC:  Alert and oriented x 3 PSYCHIATRIC:  Normal affect    Risk Assessment/Risk Calculators:                  ASSESSMENT & PLAN:    Postpartum hypertension  Pituitary tumor Palpitations Obesity  She i does have diastolic hypertension.  For me manually she was 130/100 mmHg, repeat shows 120/90 mmHg. She has transition off the nifedipine.  So what I will do today will start low-dose amlodipine 2.5 mg daily.  She will send me updated blood pressure in 2 weeks.   The patient understands the need to lose weight with diet and exercise. We have discussed specific strategies for this.  The patient is in agreement with the above plan. The patient left the office in stable condition.  The patient will follow up in 16 weeks virtually.   Patient Instructions  Medication Instructions:  Your physician has recommended you make the following change in your medication:  START: Amlodipine 2.5 mg once daily Please take your blood pressure daily for 2 weeks and send in a MyChart message. Please include heart rates.   HOW TO TAKE YOUR BLOOD PRESSURE: Rest 5 minutes before taking your blood pressure. Don't smoke or drink caffeinated beverages for at least 30 minutes before. Take your blood pressure before (not after) you eat. Sit comfortably with your back supported and both feet on the floor (don't cross your legs). Elevate your arm to heart level on a table or a desk. Use the proper sized cuff. It should fit smoothly and snugly around your bare upper arm. There should be enough room to slip a fingertip under the cuff. The bottom edge of the cuff should be 1 inch above the crease of the elbow. Ideally, take 3 measurements at one sitting and record the average.  *If you need a refill on your cardiac medications before your next appointment, please call your pharmacy*   Lab Work: None If you have labs (blood work) drawn today and your tests are completely normal, you will receive your results only by: MyChart Message (if you have MyChart) OR A paper copy in the mail If you have any lab test that is  abnormal or we need to change your treatment, we will call you to review the results.   Testing/Procedures: None   Follow-Up: At [facility], you and your health needs are our priority.  As part of our continuing mission to provide you with exceptional heart care, we have created designated Provider Care Teams.  These Care Teams include your primary Cardiologist (physician) and Advanced Practice Providers (APPs -  Physician Assistants and Nurse Practitioners) who all work together to provide you with the care you need, when you need it.   Your next appointment:   16 week(s)  Provider:   Thomasene Ripple, DO     Dispo:  No follow-ups on file.   Medication Adjustments/Labs and Tests Ordered: Current medicines are reviewed at length with the patient today.  Concerns regarding medicines are outlined above.  Tests Ordered: No orders of the defined types were placed in this encounter.  Medication Changes: Meds ordered this encounter  Medications   amLODipine (NORVASC) 2.5 MG tablet    Sig: Take 1 tablet (2.5 mg total) by mouth daily.    Dispense:  90 tablet    Refill:  3

## 2022-11-02 ENCOUNTER — Encounter: Payer: Self-pay | Admitting: Cardiology

## 2022-11-17 ENCOUNTER — Encounter: Payer: Self-pay | Admitting: Cardiology

## 2023-02-13 ENCOUNTER — Telehealth: Payer: Self-pay

## 2023-02-13 ENCOUNTER — Ambulatory Visit: Payer: 59 | Admitting: Cardiology

## 2023-02-13 NOTE — Telephone Encounter (Signed)
Called pt to obtain consent. She consented, however the pt was at work. I offered to reschedule the pt she wanted to. Pt rescheduled for Friday with Dr. Servando Salina.     Patient Consent for Virtual Visit        Sherri Castaneda has provided verbal consent on 02/13/2023 for a virtual visit (video or telephone).   CONSENT FOR VIRTUAL VISIT FOR:  Sherri Castaneda  By participating in this virtual visit I agree to the following:  I hereby voluntarily request, consent and authorize [facility] and its employed or contracted physicians, physician assistants, nurse practitioners or other licensed health care professionals (the Practitioner), to provide me with telemedicine health care services (the "Services") as deemed necessary by the treating Practitioner. I acknowledge and consent to receive the Services by the Practitioner via telemedicine. I understand that the telemedicine visit will involve communicating with the Practitioner through live audiovisual communication technology and the disclosure of certain medical information by electronic transmission. I acknowledge that I have been given the opportunity to request an in-person assessment or other available alternative prior to the telemedicine visit and am voluntarily participating in the telemedicine visit.  I understand that I have the right to withhold or withdraw my consent to the use of telemedicine in the course of my care at any time, without affecting my right to future care or treatment, and that the Practitioner or I may terminate the telemedicine visit at any time. I understand that I have the right to inspect all information obtained and/or recorded in the course of the telemedicine visit and may receive copies of available information for a reasonable fee.  I understand that some of the potential risks of receiving the Services via telemedicine include:  Delay or interruption in medical evaluation due to technological equipment failure or  disruption; Information transmitted may not be sufficient (e.g. poor resolution of images) to allow for appropriate medical decision making by the Practitioner; and/or  In rare instances, security protocols could fail, causing a breach of personal health information.  Furthermore, I acknowledge that it is my responsibility to provide information about my medical history, conditions and care that is complete and accurate to the best of my ability. I acknowledge that Practitioner's advice, recommendations, and/or decision may be based on factors not within their control, such as incomplete or inaccurate data provided by me or distortions of diagnostic images or specimens that may result from electronic transmissions. I understand that the practice of medicine is not an exact science and that Practitioner makes no warranties or guarantees regarding treatment outcomes. I acknowledge that a copy of this consent can be made available to me via my patient portal [facility] MyChart), or I can request a printed copy by calling the office of [facility].    I understand that my insurance will be billed for this visit.   I have read or had this consent read to me. I understand the contents of this consent, which adequately explains the benefits and risks of the Services being provided via telemedicine.  I have been provided ample opportunity to ask questions regarding this consent and the Services and have had my questions answered to my satisfaction. I give my informed consent for the services to be provided through the use of telemedicine in my medical care

## 2023-02-17 ENCOUNTER — Encounter: Payer: Self-pay | Admitting: Cardiology

## 2023-02-17 ENCOUNTER — Ambulatory Visit: Payer: 59 | Attending: Internal Medicine | Admitting: Cardiology

## 2023-02-17 VITALS — BP 132/80 | HR 94 | Ht 60.0 in | Wt 174.0 lb

## 2023-02-17 DIAGNOSIS — E669 Obesity, unspecified: Secondary | ICD-10-CM | POA: Diagnosis not present

## 2023-02-17 DIAGNOSIS — Z3A00 Weeks of gestation of pregnancy not specified: Secondary | ICD-10-CM

## 2023-02-17 DIAGNOSIS — O165 Unspecified maternal hypertension, complicating the puerperium: Secondary | ICD-10-CM

## 2023-02-17 NOTE — Progress Notes (Signed)
[facility]  Follow Up Note   Date:  02/17/2023   ID:  Sherri Castaneda, DOB July 24, 1985, MRN [id number]  PCP:  Chyrel Masson   [facility] Providers Cardiologist:  Thomasene Ripple, DO  Electrophysiologist:  None        Referring MD: Virgilio Belling, PA-C   Chief Complaint: " I am doing well"  She is at home. I I am in the office.  Virtual Visit via Video  Note . I connected with the patient today by a   video enabled telemedicine application and verified that I am speaking with the correct person using two identifiers.    History of Present Illness:    Sherri Castaneda is a 37 y.o. female [G5P3205] who returns for follow up of postpartum hypetension.   Medical history includes prediabetes, obesity and postpartum hypertension.  At her last visitI started patient on low-dose amlodipine 2.5 mg daily.  She had sent me updates of her blood pressure over the summer.  She tells me she has not had any hospitalizations since her visit.  She has been doing well.    She reports that her blood pressure was 132/80 this morning.The patient is unsure if her blood pressure is consistently at this level. She expresses a desire to eventually discontinue her blood pressure medication and is making lifestyle changes, including drinking plenty of water and exercising, to achieve this goal.   Prior CV Studies Reviewed: The following studies were reviewed today:   Past Medical History:  Diagnosis Date   GERD (gastroesophageal reflux disease)    Gestational Diabetes    Headache    History of kidney stones    2007   Hypertension    PONV (postoperative nausea and vomiting)    Prediabetes    Preterm labor    Delivery at 32 weeks and 34 weeks.   Seasonal allergies    Vaginal Pap smear, abnormal     Past Surgical History:  Procedure Laterality Date   CRANIOTOMY N/A 08/04/2022   Procedure: ENDOSCOPIC TRANSNASAL TRANSPHENOID RESECTION OF TUMOR;  Surgeon:  Lisbeth Renshaw, MD;  Location: [facility] OR;  Service: Neurosurgery;  Laterality: N/A;   GYNECOLOGIC CRYOSURGERY  2006   KNEE ARTHROSCOPY Right    TRANSPHENOIDAL APPROACH EXPOSURE N/A 08/04/2022   Procedure: TRANSPHENOIDAL APPROACH EXPOSURE;  Surgeon: Marene Lenz, Meghan A, DO;  Location: [facility] OR;  Service: ENT;  Laterality: N/A;      OB History     Gravida  5   Para  5   Term  3   Preterm  2   AB      Living  5      SAB      IAB      Ectopic      Multiple  0   Live Births  5               Current Medications: Current Meds  Medication Sig   butalbital-acetaminophen-caffeine (FIORICET) 50-325-40 MG tablet Take 2 tablets by mouth every 6 (six) hours as needed for headache.   fluticasone (FLONASE) 50 MCG/ACT nasal spray Place 2 sprays into both nostrils daily.   Multiple Vitamin (MULTIVITAMIN) tablet Take 1 tablet by mouth daily.     Allergies:   Flagyl [metronidazole], Pineapple, and Latex   Social History   Socioeconomic History   Marital status: Married    Spouse name: Not on file   Number of children: Not on file   Years of  education: Not on file   Highest education level: Not on file  Occupational History   Not on file  Tobacco Use   Smoking status: Never   Smokeless tobacco: Never  Vaping Use   Vaping status: Never Used  Substance and Sexual Activity   Alcohol use: No   Drug use: No   Sexual activity: Yes    Partners: Male    Birth control/protection: Condom, None  Other Topics Concern   Not on file  Social History Narrative   Not on file   Social Determinants of Health   Financial Resource Strain: Not on file  Food Insecurity: Low Risk  (07/14/2022)   [facility]   Hunger Vital Sign    Worried About Running Out of Food in the Last Year: Never true    Ran Out of Food in the Last Year: Never true  Transportation Needs: No Transportation Needs (07/14/2022)   [facility]    Transportation    In the past 12 months, has lack of reliable transportation kept you from medical appointments, meetings, work or from getting things needed for daily living? : No  Physical Activity: Not on file  Stress: Not on file  Social Connections: Not on file      Family History  Problem Relation Age of Onset   Cancer Mother    Cancer Father    Hypertension Maternal Uncle    Cancer Maternal Grandmother    Asthma Neg Hx    Birth defects Neg Hx    Diabetes Neg Hx    Heart disease Neg Hx       ROS:   Please see the history of present illness.     All other systems reviewed and are negative.   Labs/EKG Reviewed:    EKG:   EKG was not ordered today.  Recent Labs: 06/07/2022: TSH 0.433 07/05/2022: ALT 19 08/05/2022: BUN 6; Creatinine, Ser 0.65; Hemoglobin 12.1; Platelets 290; Potassium 3.9 08/06/2022: Sodium 141   Recent Lipid Panel No results found for: "CHOL", "TRIG", "HDL", "CHOLHDL", "LDLCALC", "LDLDIRECT"  Physical Exam:    VS:  BP 132/80   Pulse 94   Ht 5' (1.524 m)   Wt 174 lb (78.9 kg)   BMI 33.98 kg/m     Wt Readings from Last 3 Encounters:  02/17/23 174 lb (78.9 kg)  02/13/23 173 lb (78.5 kg)  10/24/22 175 lb (79.4 kg)       Risk Assessment/Risk Calculators:                 ASSESSMENT & PLAN:    Hypertension Blood pressure of 132/80 on Amlodipine 2.5mg . Discussed the goal of maintaining BP less than 130 systolic. Considered increasing Amlodipine to 5mg  if BP consistently above 130. -Check BP daily for two weeks and report values via MyChart. -Consider increasing Amlodipine to 5mg  based on two-week BP readings.  General Health Maintenance Discussed lifestyle modifications for hypertension management including hydration and exercise. Patient expressed desire to eventually discontinue antihypertensive medication. -Encourage continued lifestyle modifications. -Plan for follow-up in six months. Patient Instructions  Medication Instructions:   NO CHANGES  *If you need a refill on your cardiac medications before your next appointment, please call your pharmacy*   Follow-Up: At [facility], you and your health needs are our priority.  As part of our continuing mission to provide you with exceptional heart care, we have created designated Provider Care Teams.  These Care Teams  include your primary Cardiologist (physician) and Advanced Practice Providers (APPs -  Physician Assistants and Nurse Practitioners) who all work together to provide you with the care you need, when you need it.  We recommend signing up for the patient portal called "MyChart".  Sign up information is provided on this After Visit Summary.  MyChart is used to connect with patients for Virtual Visits (Telemedicine).  Patients are able to view lab/test results, encounter notes, upcoming appointments, etc.  Non-urgent messages can be sent to your provider as well.   To learn more about what you can do with MyChart, go to [email or web address].    Your next appointment:    6 months with Dr. Servando Salina -- in office ** call in December for an April 2025 appointment   Other Instructions  Dr. Servando Salina would like you to check your BP at home and send readings via MyChart  HOW TO TAKE YOUR BLOOD PRESSURE: Rest 5 minutes before taking your blood pressure. Don't smoke or drink caffeinated beverages for at least 30 minutes before. Take your blood pressure before (not after) you eat. Sit comfortably with your back supported and both feet on the floor (don't cross your legs). Elevate your arm to heart level on a table or a desk. Use the proper sized cuff. It should fit smoothly and snugly around your bare upper arm. There should be enough room to slip a fingertip under the cuff. The bottom edge of the cuff should be 1 inch above the crease of the elbow. Ideally, take 3 measurements at one sitting and record the average.     Dispo:  No follow-ups on file.    Medication Adjustments/Labs and Tests Ordered: Current medicines are reviewed at length with the patient today.  Concerns regarding medicines are outlined above.  Tests Ordered: No orders of the defined types were placed in this encounter.  Medication Changes: No orders of the defined types were placed in this encounter.

## 2023-02-17 NOTE — Patient Instructions (Signed)
Medication Instructions:  NO CHANGES  *If you need a refill on your cardiac medications before your next appointment, please call your pharmacy*   Follow-Up: At [facility], you and your health needs are our priority.  As part of our continuing mission to provide you with exceptional heart care, we have created designated Provider Care Teams.  These Care Teams include your primary Cardiologist (physician) and Advanced Practice Providers (APPs -  Physician Assistants and Nurse Practitioners) who all work together to provide you with the care you need, when you need it.  We recommend signing up for the patient portal called "MyChart".  Sign up information is provided on this After Visit Summary.  MyChart is used to connect with patients for Virtual Visits (Telemedicine).  Patients are able to view lab/test results, encounter notes, upcoming appointments, etc.  Non-urgent messages can be sent to your provider as well.   To learn more about what you can do with MyChart, go to [email or web address].    Your next appointment:    6 months with Dr. Servando Salina -- in office ** call in December for an April 2025 appointment   Other Instructions  Dr. Servando Salina would like you to check your BP at home and send readings via MyChart  HOW TO TAKE YOUR BLOOD PRESSURE: Rest 5 minutes before taking your blood pressure. Don't smoke or drink caffeinated beverages for at least 30 minutes before. Take your blood pressure before (not after) you eat. Sit comfortably with your back supported and both feet on the floor (don't cross your legs). Elevate your arm to heart level on a table or a desk. Use the proper sized cuff. It should fit smoothly and snugly around your bare upper arm. There should be enough room to slip a fingertip under the cuff. The bottom edge of the cuff should be 1 inch above the crease of the elbow. Ideally, take 3 measurements at one sitting and record the average.

## 2023-03-23 ENCOUNTER — Telehealth: Payer: Self-pay | Admitting: Cardiology

## 2023-03-23 NOTE — Telephone Encounter (Signed)
Patient c/o Palpitations:  STAT if patient reporting lightheadedness, shortness of breath, or chest pain  How long have you had palpitations/irregular HR/ Afib? A week and a half Are you having the symptoms now? no  Are you currently experiencing lightheadedness, SOB or CP? SOB but not currently   Do you have a history of afib (atrial fibrillation) or irregular heart rhythm? No   Have you checked your BP or HR? (document readings if available):  Today: 126/82 hr 90   Are you experiencing any other symptoms? Pt called stating she has been having palpitations for about a week and a half now. She states she doesn't have any pain in her chest, it feels more like heartburn.  She states she has been burping a lot.

## 2023-03-23 NOTE — Telephone Encounter (Signed)
Patient identification verified by 2 forms. Marilynn Rail, RN    Called and spoke to patient  Patient states:    -at times feels like heart is beating faster than normal   -symptoms started 1.5 weeks ago   -during episodes when she checks heart rate pulse is "normal" on apple watch -during episodes heart rate can range from 79-90BPM   -episodes happen 1-2x a day   -when she has palpitation notices she has gas discomfort, burping relieves discomfort  Patient denies:   -SOB/difficulty breathing   -chest pain  Advised patient message sent to provider for input/advisement  Reviewed ED warning signs/precautions  Patient verbalized understanding, no questions at this time

## 2023-03-24 ENCOUNTER — Ambulatory Visit
Admission: EM | Admit: 2023-03-24 | Discharge: 2023-03-24 | Disposition: A | Discharge status: Home / Self Care | Payer: 59

## 2023-03-24 DIAGNOSIS — K219 Gastro-esophageal reflux disease without esophagitis: Secondary | ICD-10-CM | POA: Diagnosis not present

## 2023-03-24 LAB — POCT URINE PREGNANCY: Preg Test, Ur: NEGATIVE

## 2023-03-24 MED ORDER — PANTOPRAZOLE SODIUM 20 MG PO TBEC: 20.0000 mg | DELAYED_RELEASE_TABLET | Freq: Every day | ORAL | 0 refills | Status: AC

## 2023-03-24 NOTE — ED Triage Notes (Signed)
Reports feels as if "my heart is beating fast" intermittently x 1 week. Unable to associate with any specific activity. Mother of 5 children. Corliss Parish is 9 months old. No chest pain. States hx of GERD and does not have any of her prescription medication available. Skin p/w/d. No signs of distress. Rep even and unlabored.

## 2023-04-01 NOTE — ED Provider Notes (Signed)
[name]    CSN: [id number] Arrival date & time: 03/24/23  1643      History   Chief Complaint Chief Complaint  Patient presents with   Heartburn   "my heart is beating fast"    HPI Sherri Castaneda is a 37 y.o. female.   Patient here today for evaluation of palpitations that started about a week ago.  She reports that she has 5 children and thinks she might just have too much on her plate at this time.  She is not currently experiencing palpitations.  She also reports some heartburn.  She states that she does not have GERD and that she does no longer take a medication she was previously prescribed.  The history is provided by the patient.  Heartburn Pertinent negatives include no chest pain, no abdominal pain and no shortness of breath.    Past Medical History:  Diagnosis Date   GERD (gastroesophageal reflux disease)    Gestational Diabetes    Headache    History of kidney stones    2007   Hypertension    PONV (postoperative nausea and vomiting)    Prediabetes    Preterm labor    Delivery at 32 weeks and 34 weeks.   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   Sinus tachycardia 06/08/2022   Lab test positive for detection of COVID-19 virus 06/08/2022   GERD (gastroesophageal reflux disease) 06/08/2022   Pre-eclampsia, severe, postpartum condition 04/09/2022   Preeclampsia, severe 04/09/2022   Labor and delivery, indication for care 03/23/2022   Prediabetes 09/22/2021   AMA (advanced maternal age) multigravida 35+ 09/21/2021   Supervision of high-risk pregnancy 09/08/2021   Previous two preterm deliveries followed by two term deliveries, antepartum 03/18/2013    Past Surgical History:  Procedure Laterality Date   CRANIOTOMY N/A 08/04/2022   Procedure: ENDOSCOPIC TRANSNASAL TRANSPHENOID RESECTION OF TUMOR;  Surgeon: Lisbeth Renshaw, MD;   Location: [facility] OR;  Service: Neurosurgery;  Laterality: N/A;   GYNECOLOGIC CRYOSURGERY  2006   KNEE ARTHROSCOPY Right    TRANSPHENOIDAL APPROACH EXPOSURE N/A 08/04/2022   Procedure: TRANSPHENOIDAL APPROACH EXPOSURE;  Surgeon: Laren Boom, DO;  Location: [facility] OR;  Service: ENT;  Laterality: N/A;    OB History     Gravida  5   Para  5   Term  3   Preterm  2   AB      Living  5      SAB      IAB      Ectopic      Multiple  0   Live Births  5            Home Medications    Prior to Admission medications   Medication Sig Start Date End Date Taking? Authorizing Provider  NIFEdipine (ADALAT CC) 30 MG 24 hr tablet Take 30 mg by mouth daily.   Yes [provider]  pantoprazole (PROTONIX) 20 MG tablet Take 1 tablet (20 mg total) by mouth daily. 03/24/23  Yes Tomi Bamberger, PA-C  amLODipine (NORVASC) 2.5 MG tablet Take 1 tablet (2.5 mg total) by mouth daily. 10/24/22 02/13/23  Tobb, Lavona Mound, DO  butalbital-acetaminophen-caffeine (FIORICET) 50-325-40 MG tablet Take 2 tablets by mouth every 6 (six) hours as needed for headache. 04/12/22   Jaymes Graff, MD  fluticasone (FLONASE) 50 MCG/ACT nasal spray Place  2 sprays into both nostrils daily. 07/22/22   [provider]  Multiple Vitamin (MULTIVITAMIN) tablet Take 1 tablet by mouth daily.    [provider]    Family History Family History  Problem Relation Age of Onset   Cancer Mother    Cancer Father    Hypertension Maternal Uncle    Cancer Maternal Grandmother    Asthma Neg Hx    Birth defects Neg Hx    Diabetes Neg Hx    Heart disease Neg Hx     Social History Social History   Tobacco Use   Smoking status: Never   Smokeless tobacco: Never  Vaping Use   Vaping status: Never Used  Substance Use Topics   Alcohol use: No   Drug use: No     Allergies   Flagyl [metronidazole], Pineapple, and Latex   Review of Systems Review of Systems  Constitutional:  Negative for  chills and fever.  Eyes:  Negative for discharge and redness.  Respiratory:  Negative for shortness of breath.   Cardiovascular:  Positive for palpitations. Negative for chest pain.  Gastrointestinal:  Positive for heartburn. Negative for abdominal pain, nausea and vomiting.     Physical Exam Triage Vital Signs ED Triage Vitals  Encounter Vitals Group     BP 03/24/23 1653 (!) 145/95     Systolic BP Percentile --      Diastolic BP Percentile --      Pulse Rate 03/24/23 1653 86     Resp 03/24/23 1653 18     Temp 03/24/23 1653 98.4 F (36.9 C)     Temp Source 03/24/23 1653 Oral     SpO2 03/24/23 1653 99 %     Weight 03/24/23 1654 170 lb (77.1 kg)     Height 03/24/23 1654 5' (1.524 m)     Head Circumference --      Peak Flow --      Pain Score 03/24/23 1654 0     Pain Loc --      Pain Education --      Exclude from Growth Chart --    No data found.  Updated Vital Signs BP (!) 145/95 (BP Location: Left Arm)   Pulse 86   Temp 98.4 F (36.9 C) (Oral)   Resp 18   Ht 5' (1.524 m)   Wt 170 lb (77.1 kg)   SpO2 99%   BMI 33.20 kg/m   Visual Acuity Right Eye Distance:   Left Eye Distance:   Bilateral Distance:    Right Eye Near:   Left Eye Near:    Bilateral Near:     Physical Exam Vitals and nursing note reviewed.  Constitutional:      General: She is not in acute distress.    Appearance: Normal appearance. She is not ill-appearing.  HENT:     Head: Normocephalic and atraumatic.  Eyes:     Conjunctiva/sclera: Conjunctivae normal.  Cardiovascular:     Rate and Rhythm: Normal rate and regular rhythm.     Heart sounds: Normal heart sounds.  Pulmonary:     Effort: Pulmonary effort is normal. No respiratory distress.     Breath sounds: No wheezing, rhonchi or rales.  Neurological:     Mental Status: She is alert.  Psychiatric:        Mood and Affect: Mood normal.        Behavior: Behavior normal.        Thought Content: Thought content normal.  UC  Treatments / Results  Labs (all labs ordered are listed, but only abnormal results are displayed) Labs Reviewed  POCT URINE PREGNANCY - Normal    EKG   Radiology No results found.  Procedures Procedures (including critical care time)  Medications Ordered in UC Medications - No data to display  Initial Impression / Assessment and Plan / UC Course  I have reviewed the triage vital signs and the nursing notes.  Pertinent labs & imaging results that were available during my care of the patient were reviewed by me and considered in my medical decision making (see chart for details).  Will treat to cover GERD with protonix. EKG without concerning findings and patient is stable. Advised follow up with any persistent symptoms or with any further concerns.  Encouraged follow up with PCP.   Final Clinical Impressions(s) / UC Diagnoses   Final diagnoses:  Gastroesophageal reflux disease, unspecified whether esophagitis present   Discharge Instructions   None    ED Prescriptions     Medication Sig Dispense Auth. Provider   pantoprazole (PROTONIX) 20 MG tablet Take 1 tablet (20 mg total) by mouth daily. 30 tablet Tomi Bamberger, PA-C      PDMP not reviewed this encounter.   Tomi Bamberger, PA-C 04/01/23 1601

## 2023-04-05 ENCOUNTER — Telehealth: Payer: 59 | Admitting: Family Medicine

## 2023-04-05 DIAGNOSIS — J069 Acute upper respiratory infection, unspecified: Secondary | ICD-10-CM | POA: Diagnosis not present

## 2023-04-05 MED ORDER — BENZONATATE 100 MG PO CAPS: 100.0000 mg | ORAL_CAPSULE | Freq: Three times a day (TID) | ORAL | 0 refills | Status: AC | PRN

## 2023-04-05 MED ORDER — AZELASTINE HCL 0.1 % NA SOLN: 2.0000 | Freq: Two times a day (BID) | NASAL | 0 refills | Status: AC

## 2023-04-05 NOTE — Progress Notes (Signed)

## 2024-05-10 ENCOUNTER — Encounter: Payer: Self-pay | Admitting: Cardiology

## 2024-07-19 ENCOUNTER — Ambulatory Visit: Admitting: Cardiology
